# Patient Record
Sex: Female | Born: 1985 | Race: Black or African American | Hispanic: No | Marital: Single | Smoking: Never smoker
Health system: Southern US, Community
[De-identification: ages and names within clinical notes are randomized; demographics above are authoritative.]

## PROBLEM LIST (undated history)

## (undated) DIAGNOSIS — J45909 Unspecified asthma, uncomplicated: Secondary | ICD-10-CM

## (undated) DIAGNOSIS — E785 Hyperlipidemia, unspecified: Secondary | ICD-10-CM

## (undated) DIAGNOSIS — G4730 Sleep apnea, unspecified: Secondary | ICD-10-CM

## (undated) DIAGNOSIS — I2510 Atherosclerotic heart disease of native coronary artery without angina pectoris: Secondary | ICD-10-CM

## (undated) DIAGNOSIS — I10 Essential (primary) hypertension: Secondary | ICD-10-CM

## (undated) DIAGNOSIS — O24419 Gestational diabetes mellitus in pregnancy, unspecified control: Secondary | ICD-10-CM

## (undated) HISTORY — DX: Unspecified asthma, uncomplicated: J45.909

---
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
## 2009-10-20 HISTORY — PX: WISDOM TOOTH EXTRACTION: SHX21

## 2010-06-06 DIAGNOSIS — O139 Gestational [pregnancy-induced] hypertension without significant proteinuria, unspecified trimester: Secondary | ICD-10-CM

## 2017-11-02 ENCOUNTER — Ambulatory Visit: Payer: Self-pay | Admitting: Family Medicine

## 2018-10-20 DIAGNOSIS — O24419 Gestational diabetes mellitus in pregnancy, unspecified control: Secondary | ICD-10-CM

## 2018-10-20 HISTORY — DX: Gestational diabetes mellitus in pregnancy, unspecified control: O24.419

## 2018-10-28 DIAGNOSIS — Z3A00 Weeks of gestation of pregnancy not specified: Secondary | ICD-10-CM | POA: Diagnosis not present

## 2018-10-28 DIAGNOSIS — O26891 Other specified pregnancy related conditions, first trimester: Secondary | ICD-10-CM | POA: Diagnosis present

## 2018-10-28 DIAGNOSIS — O10011 Pre-existing essential hypertension complicating pregnancy, first trimester: Secondary | ICD-10-CM | POA: Insufficient documentation

## 2018-10-28 DIAGNOSIS — R51 Headache: Secondary | ICD-10-CM | POA: Diagnosis not present

## 2018-10-29 ENCOUNTER — Encounter (HOSPITAL_COMMUNITY): Payer: Self-pay | Admitting: *Deleted

## 2018-10-29 ENCOUNTER — Emergency Department (HOSPITAL_COMMUNITY)
Admission: EM | Admit: 2018-10-29 | Discharge: 2018-10-29 | Disposition: A | Discharge status: Home / Self Care | Payer: Medicaid Other | Attending: Emergency Medicine | Admitting: Emergency Medicine

## 2018-10-29 ENCOUNTER — Other Ambulatory Visit: Payer: Self-pay

## 2018-10-29 DIAGNOSIS — I10 Essential (primary) hypertension: Secondary | ICD-10-CM

## 2018-10-29 DIAGNOSIS — Z3490 Encounter for supervision of normal pregnancy, unspecified, unspecified trimester: Secondary | ICD-10-CM

## 2018-10-29 HISTORY — DX: Essential (primary) hypertension: I10

## 2018-10-29 LAB — CBC WITH DIFFERENTIAL/PLATELET
Abs Immature Granulocytes: 0.02 10*3/uL (ref 0.00–0.07)
Basophils Absolute: 0 10*3/uL (ref 0.0–0.1)
Basophils Relative: 0 %
Eosinophils Absolute: 0.3 10*3/uL (ref 0.0–0.5)
Eosinophils Relative: 3 %
HCT: 41.9 % (ref 36.0–46.0)
Hemoglobin: 13.3 g/dL (ref 12.0–15.0)
Immature Granulocytes: 0 %
Lymphocytes Relative: 22 %
Lymphs Abs: 2 10*3/uL (ref 0.7–4.0)
MCH: 26.2 pg (ref 26.0–34.0)
MCHC: 31.7 g/dL (ref 30.0–36.0)
MCV: 82.5 fL (ref 80.0–100.0)
MONO ABS: 0.6 10*3/uL (ref 0.1–1.0)
Monocytes Relative: 7 %
Neutro Abs: 5.9 10*3/uL (ref 1.7–7.7)
Neutrophils Relative %: 68 %
Platelets: 310 10*3/uL (ref 150–400)
RBC: 5.08 MIL/uL (ref 3.87–5.11)
RDW: 13.7 % (ref 11.5–15.5)
WBC: 8.7 10*3/uL (ref 4.0–10.5)
nRBC: 0 % (ref 0.0–0.2)

## 2018-10-29 LAB — URINALYSIS, ROUTINE W REFLEX MICROSCOPIC
Bilirubin Urine: NEGATIVE
GLUCOSE, UA: NEGATIVE mg/dL
Ketones, ur: NEGATIVE mg/dL
Leukocytes, UA: NEGATIVE
Nitrite: NEGATIVE
PH: 6 (ref 5.0–8.0)
Protein, ur: 30 mg/dL — AB
Specific Gravity, Urine: 1.014 (ref 1.005–1.030)

## 2018-10-29 LAB — BASIC METABOLIC PANEL
Anion gap: 12 (ref 5–15)
BUN: 14 mg/dL (ref 6–20)
CO2: 21 mmol/L — ABNORMAL LOW (ref 22–32)
Calcium: 8.9 mg/dL (ref 8.9–10.3)
Chloride: 103 mmol/L (ref 98–111)
Creatinine, Ser: 1.04 mg/dL — ABNORMAL HIGH (ref 0.44–1.00)
GFR calc Af Amer: >60 mL/min (ref >60)
GFR calc non Af Amer: >60 mL/min (ref >60)
Glucose, Bld: 99 mg/dL (ref 70–99)
Potassium: 3.5 mmol/L (ref 3.5–5.1)
Sodium: 136 mmol/L (ref 135–145)

## 2018-10-29 LAB — HCG, QUANTITATIVE, PREGNANCY: hCG, Beta Chain, Quant, S: 101079 m[IU]/mL — ABNORMAL HIGH (ref <5)

## 2018-10-29 MED ORDER — LABETALOL HCL 5 MG/ML IV SOLN
10.0000 mg | Freq: Once | INTRAVENOUS | Status: AC
  Administered 2018-10-29: 10 mg via INTRAVENOUS
  Filled 2018-10-29: qty 4

## 2018-10-29 MED ORDER — PRENATAL VITAMIN 27-0.8 MG PO TABS: 1.0000 | ORAL_TABLET | Freq: Every day | ORAL | 0 refills | Status: DC

## 2018-10-29 MED ORDER — LABETALOL HCL 100 MG PO TABS: 100.0000 mg | ORAL_TABLET | Freq: Two times a day (BID) | ORAL | 0 refills | Status: DC

## 2018-10-29 NOTE — ED Notes (Signed)
Bed: YJ85 Expected date:  Expected time:  Means of arrival:  Comments: [place] d

## 2018-10-29 NOTE — ED Notes (Signed)
Pt felt dizzy upon standing but ambulated with no difficulties to BR.

## 2018-10-29 NOTE — Discharge Instructions (Signed)
Follow up with your primary care provider (use Resource List provided if you do not have a primary care doctor) in the next month to continue treatment of your blood pressure. Take medications as prescribed. Return here as needed. Follow up with OB/GYN for routine prenatal care.

## 2018-10-29 NOTE — ED Triage Notes (Signed)
Pt stated "[facility]  They sent me to [facility] because of my b/p.  They also did a pregnancy test and it was positive.  [facility] told me to come to the ED."

## 2018-10-29 NOTE — ED Provider Notes (Signed)
[place] DEPT Provider Note   CSN: [id number] Arrival date & time: 10/28/18  2338     History   Chief Complaint Chief Complaint  Patient presents with  . Hypertension    HPI Danielle Kent is a 33 y.o. female.  Patient with a history of HTN, off medications for 6+ years, presents with elevated blood pressure found on routine health examination this morning. She went to [facility] for further management and was found to be pregnant. LMP 08/31/18. No chest pain, nausea, abdominal pain, vaginal bleeding or discharge.   The history is provided by the patient. No language interpreter was used.  Hypertension  Associated symptoms include headaches (Pressure type headache, generalized.). Pertinent negatives include no chest pain, no abdominal pain and no shortness of breath.    Past Medical History:  Diagnosis Date  . Hypertension     There are no active problems to display for this patient.   Past Surgical History:  Procedure Laterality Date  . CESAREAN SECTION       OB History    Gravida  1   Para      Term      Preterm      AB      Living        SAB      TAB      Ectopic      Multiple      Live Births               Home Medications    Prior to Admission medications   Not on File    Family History No family history on file.  Social History Social History   Tobacco Use  . Smoking status: Not on file  Substance Use Topics  . Alcohol use: Not Currently  . Drug use: Not Currently     Allergies   Patient has no known allergies.   Review of Systems Review of Systems  Constitutional: Negative for chills and fever.  HENT: Negative.   Eyes: Negative for visual disturbance.  Respiratory: Negative.  Negative for shortness of breath.   Cardiovascular: Negative.  Negative for chest pain.  Gastrointestinal: Negative.  Negative for abdominal pain and nausea.  Musculoskeletal: Negative.   Skin: Negative.     Neurological: Positive for headaches (Pressure type headache, generalized.).     Physical Exam Updated Vital Signs BP (!) 173/97 (BP Location: Left Arm)   Pulse 81   Temp 99.2 F (37.3 C) (Oral)   Resp (!) 21   Ht 5' (1.524 m)   Wt (!) 148.3 kg   LMP 08/31/2018 (Exact Date)   SpO2 98%   BMI 63.86 kg/m   Physical Exam Vitals signs and nursing note reviewed.  Constitutional:      General: She is not in acute distress.    Appearance: She is well-developed.  HENT:     Head: Normocephalic.     Mouth/Throat:     Mouth: Mucous membranes are moist.  Neck:     Musculoskeletal: Normal range of motion and neck supple.  Cardiovascular:     Rate and Rhythm: Normal rate and regular rhythm.     Heart sounds: No murmur.  Pulmonary:     Effort: Pulmonary effort is normal.     Breath sounds: Normal breath sounds. No wheezing, rhonchi or rales.  Abdominal:     General: Bowel sounds are normal.     Palpations: Abdomen is soft.     Tenderness:  There is no abdominal tenderness. There is no guarding or rebound.  Musculoskeletal: Normal range of motion.     Right lower leg: No edema.     Left lower leg: No edema.  Skin:    General: Skin is warm and dry.     Findings: No rash.  Neurological:     Mental Status: She is alert and oriented to person, place, and time.     Deep Tendon Reflexes: Reflexes normal.      ED Treatments / Results  Labs (all labs ordered are listed, but only abnormal results are displayed) Labs Reviewed  BASIC METABOLIC PANEL - Abnormal; Notable for the following components:      Result Value   CO2 21 (*)    Creatinine, Ser 1.04 (*)    All other components within normal limits  URINALYSIS, ROUTINE W REFLEX MICROSCOPIC - Abnormal; Notable for the following components:   Hgb urine dipstick SMALL (*)    Protein, ur 30 (*)    Bacteria, UA RARE (*)    All other components within normal limits  HCG, QUANTITATIVE, PREGNANCY - Abnormal; Notable for the following  components:   hCG, Beta Chain, Quant, S 101,079 (*)    All other components within normal limits  CBC WITH DIFFERENTIAL/PLATELET    EKG None  Radiology No results found.  Procedures Procedures (including critical care time)  Medications Ordered in ED Medications  labetalol (NORMODYNE,TRANDATE) injection 10 mg (10 mg Intravenous Given by Other 10/29/18 3244)     Initial Impression / Assessment and Plan / ED Course  I have reviewed the triage vital signs and the nursing notes.  Pertinent labs & imaging results that were available during my care of the patient were reviewed by me and considered in my medical decision making (see chart for details).     Patient to ED with elevated blood pressure, found to be pregnant on evaluation prior to arrival. Off blood pressure medications x years secondary to financial constraints.   On arrival blood pressure significantly elevated at 210/143. Quant beta is >100000, indicating early pregnancy, c/w dates. No concern for preeclampsia.   Discussed appropriate medications with pharmacist who recommended labetalol. Single dose of 10 mg IV effective in reducing bp to 173/97. Patient reports her headache is improved.   She is felt appropriate for discharge. Will start on 100 mg bid labetalol and encourage PCP follow up. Resources provided.   Final Clinical Impressions(s) / ED Diagnoses   Final diagnoses:  None   1. HTN 2. Pregnant  ED Discharge Orders    None       Charlann Lange, PA-C 10/29/18 0428    Shanon Rosser, MD 10/29/18 [phone]

## 2018-11-25 ENCOUNTER — Encounter (HOSPITAL_COMMUNITY): Payer: Self-pay

## 2018-11-25 ENCOUNTER — Inpatient Hospital Stay (HOSPITAL_COMMUNITY)
Admission: EM | Admit: 2018-11-25 | Discharge: 2018-11-29 | DRG: 832 | Disposition: A | Discharge status: Home / Self Care | Payer: Medicaid Other | Attending: Internal Medicine | Admitting: Internal Medicine

## 2018-11-25 ENCOUNTER — Other Ambulatory Visit: Payer: Self-pay

## 2018-11-25 ENCOUNTER — Emergency Department (HOSPITAL_COMMUNITY): Payer: Medicaid Other

## 2018-11-25 ENCOUNTER — Encounter: Payer: Self-pay | Admitting: Obstetrics and Gynecology

## 2018-11-25 ENCOUNTER — Ambulatory Visit (INDEPENDENT_AMBULATORY_CARE_PROVIDER_SITE_OTHER): Payer: Medicaid Other | Admitting: Obstetrics and Gynecology

## 2018-11-25 DIAGNOSIS — Z3A12 12 weeks gestation of pregnancy: Secondary | ICD-10-CM

## 2018-11-25 DIAGNOSIS — Z6841 Body Mass Index (BMI) 40.0 and over, adult: Secondary | ICD-10-CM

## 2018-11-25 DIAGNOSIS — O99341 Other mental disorders complicating pregnancy, first trimester: Secondary | ICD-10-CM | POA: Diagnosis present

## 2018-11-25 DIAGNOSIS — J45909 Unspecified asthma, uncomplicated: Secondary | ICD-10-CM | POA: Diagnosis present

## 2018-11-25 DIAGNOSIS — Z3481 Encounter for supervision of other normal pregnancy, first trimester: Secondary | ICD-10-CM

## 2018-11-25 DIAGNOSIS — O0990 Supervision of high risk pregnancy, unspecified, unspecified trimester: Secondary | ICD-10-CM

## 2018-11-25 DIAGNOSIS — E876 Hypokalemia: Secondary | ICD-10-CM | POA: Diagnosis present

## 2018-11-25 DIAGNOSIS — I160 Hypertensive urgency: Secondary | ICD-10-CM | POA: Diagnosis present

## 2018-11-25 DIAGNOSIS — O99281 Endocrine, nutritional and metabolic diseases complicating pregnancy, first trimester: Secondary | ICD-10-CM | POA: Diagnosis present

## 2018-11-25 DIAGNOSIS — Z3A13 13 weeks gestation of pregnancy: Secondary | ICD-10-CM

## 2018-11-25 DIAGNOSIS — O99511 Diseases of the respiratory system complicating pregnancy, first trimester: Secondary | ICD-10-CM | POA: Diagnosis present

## 2018-11-25 DIAGNOSIS — F419 Anxiety disorder, unspecified: Secondary | ICD-10-CM | POA: Diagnosis present

## 2018-11-25 DIAGNOSIS — Z79899 Other long term (current) drug therapy: Secondary | ICD-10-CM

## 2018-11-25 DIAGNOSIS — R079 Chest pain, unspecified: Secondary | ICD-10-CM

## 2018-11-25 DIAGNOSIS — O1212 Gestational proteinuria, second trimester: Secondary | ICD-10-CM

## 2018-11-25 DIAGNOSIS — O10911 Unspecified pre-existing hypertension complicating pregnancy, first trimester: Principal | ICD-10-CM | POA: Diagnosis present

## 2018-11-25 DIAGNOSIS — E669 Obesity, unspecified: Secondary | ICD-10-CM

## 2018-11-25 DIAGNOSIS — Z833 Family history of diabetes mellitus: Secondary | ICD-10-CM

## 2018-11-25 DIAGNOSIS — O99211 Obesity complicating pregnancy, first trimester: Secondary | ICD-10-CM | POA: Diagnosis present

## 2018-11-25 LAB — CBC
HCT: 38.4 % (ref 36.0–46.0)
HCT: 37.7 % (ref 36.0–46.0)
HEMOGLOBIN: 12.4 g/dL (ref 12.0–15.0)
Hemoglobin: 12.2 g/dL (ref 12.0–15.0)
MCH: 27 pg (ref 26.0–34.0)
MCH: 27.3 pg (ref 26.0–34.0)
MCHC: 32.3 g/dL (ref 30.0–36.0)
MCHC: 32.4 g/dL (ref 30.0–36.0)
MCV: 83.7 fL (ref 80.0–100.0)
MCV: 84.3 fL (ref 80.0–100.0)
Platelets: 305 10*3/uL (ref 150–400)
Platelets: 276 10*3/uL (ref 150–400)
RBC: 4.59 MIL/uL (ref 3.87–5.11)
RBC: 4.47 MIL/uL (ref 3.87–5.11)
RDW: 13.7 % (ref 11.5–15.5)
RDW: 13.9 % (ref 11.5–15.5)
WBC: 10.1 10*3/uL (ref 4.0–10.5)
WBC: 9.5 10*3/uL (ref 4.0–10.5)
nRBC: 0 % (ref 0.0–0.2)
nRBC: 0 % (ref 0.0–0.2)

## 2018-11-25 LAB — BASIC METABOLIC PANEL
Anion gap: 10 (ref 5–15)
BUN: 13 mg/dL (ref 6–20)
CO2: 23 mmol/L (ref 22–32)
Calcium: 9.2 mg/dL (ref 8.9–10.3)
Chloride: 103 mmol/L (ref 98–111)
Creatinine, Ser: 1.01 mg/dL — ABNORMAL HIGH (ref 0.44–1.00)
GFR calc Af Amer: >60 mL/min (ref >60)
GFR calc non Af Amer: >60 mL/min (ref >60)
Glucose, Bld: 113 mg/dL — ABNORMAL HIGH (ref 70–99)
Potassium: 3.1 mmol/L — ABNORMAL LOW (ref 3.5–5.1)
SODIUM: 136 mmol/L (ref 135–145)

## 2018-11-25 LAB — URINALYSIS, ROUTINE W REFLEX MICROSCOPIC
Bacteria, UA: NONE SEEN
Bilirubin Urine: NEGATIVE
GLUCOSE, UA: NEGATIVE mg/dL
Hgb urine dipstick: NEGATIVE
Ketones, ur: NEGATIVE mg/dL
Leukocytes, UA: NEGATIVE
NITRITE: NEGATIVE
Protein, ur: 100 mg/dL — AB
Specific Gravity, Urine: 1.017 (ref 1.005–1.030)
pH: 6 (ref 5.0–8.0)

## 2018-11-25 LAB — CREATININE, SERUM
Creatinine, Ser: 1.01 mg/dL — ABNORMAL HIGH (ref 0.44–1.00)
GFR calc Af Amer: >60 mL/min (ref >60)
GFR calc non Af Amer: >60 mL/min (ref >60)

## 2018-11-25 LAB — I-STAT TROPONIN, ED: Troponin i, poc: 0.01 ng/mL (ref 0.00–0.08)

## 2018-11-25 LAB — HEPATIC FUNCTION PANEL
ALT: 15 U/L (ref 0–44)
AST: 19 U/L (ref 15–41)
Albumin: 3.3 g/dL — ABNORMAL LOW (ref 3.5–5.0)
Alkaline Phosphatase: 61 U/L (ref 38–126)
BILIRUBIN TOTAL: 0.4 mg/dL (ref 0.3–1.2)
Bilirubin, Direct: <0.1 mg/dL (ref 0.0–0.2)
Total Protein: 7.6 g/dL (ref 6.5–8.1)

## 2018-11-25 LAB — HCG, QUANTITATIVE, PREGNANCY: hCG, Beta Chain, Quant, S: 85573 m[IU]/mL — ABNORMAL HIGH (ref <5)

## 2018-11-25 LAB — I-STAT BETA HCG BLOOD, ED (MC, WL, AP ONLY): I-stat hCG, quantitative: >2000 m[IU]/mL — ABNORMAL HIGH (ref <5)

## 2018-11-25 LAB — TROPONIN I: TROPONIN I: 0.03 ng/mL — AB (ref <0.03)

## 2018-11-25 LAB — MRSA PCR SCREENING: MRSA by PCR: NEGATIVE

## 2018-11-25 LAB — CBG MONITORING, ED: Glucose-Capillary: 84 mg/dL (ref 70–99)

## 2018-11-25 LAB — TSH: TSH: 2.55 u[IU]/mL (ref 0.350–4.500)

## 2018-11-25 LAB — MAGNESIUM: Magnesium: 1.8 mg/dL (ref 1.7–2.4)

## 2018-11-25 MED ORDER — ACETAMINOPHEN 650 MG RE SUPP: 650.0000 mg | Freq: Four times a day (QID) | RECTAL | Status: DC | PRN

## 2018-11-25 MED ORDER — SODIUM CHLORIDE 0.9% FLUSH
3.0000 mL | Freq: Once | INTRAVENOUS | Status: AC
  Administered 2018-11-25: 3 mL via INTRAVENOUS

## 2018-11-25 MED ORDER — ACETAMINOPHEN 325 MG PO TABS
650.0000 mg | ORAL_TABLET | Freq: Four times a day (QID) | ORAL | Status: DC | PRN
  Administered 2018-11-25 – 2018-11-26 (×2): 650 mg via ORAL
  Filled 2018-11-25 (×7): qty 2

## 2018-11-25 MED ORDER — POTASSIUM CHLORIDE CRYS ER 20 MEQ PO TBCR
40.0000 meq | EXTENDED_RELEASE_TABLET | Freq: Once | ORAL | Status: AC
  Administered 2018-11-25: 40 meq via ORAL
  Filled 2018-11-25: qty 2

## 2018-11-25 MED ORDER — ENOXAPARIN SODIUM 40 MG/0.4ML ~~LOC~~ SOLN
40.0000 mg | Freq: Every day | SUBCUTANEOUS | Status: DC
  Administered 2018-11-26: 40 mg via SUBCUTANEOUS
  Filled 2018-11-25: qty 0.4

## 2018-11-25 MED ORDER — HYDRALAZINE HCL 20 MG/ML IJ SOLN
5.0000 mg | Freq: Once | INTRAMUSCULAR | Status: AC
  Administered 2018-11-25: 5 mg via INTRAVENOUS
  Filled 2018-11-25: qty 1

## 2018-11-25 MED ORDER — HYDRALAZINE HCL 20 MG/ML IJ SOLN
10.0000 mg | Freq: Once | INTRAMUSCULAR | Status: AC
  Administered 2018-11-25: 10 mg via INTRAVENOUS
  Filled 2018-11-25: qty 1

## 2018-11-25 MED ORDER — LABETALOL HCL 200 MG PO TABS: 200.0000 mg | ORAL_TABLET | Freq: Three times a day (TID) | ORAL | Status: DC

## 2018-11-25 MED ORDER — ACETAMINOPHEN 325 MG PO TABS
650.0000 mg | ORAL_TABLET | Freq: Four times a day (QID) | ORAL | Status: DC | PRN
  Administered 2018-11-26 – 2018-11-28 (×5): 650 mg via ORAL
  Filled 2018-11-25: qty 2

## 2018-11-25 MED ORDER — PRENATAL PLUS 27-1 MG PO TABS
1.0000 | ORAL_TABLET | Freq: Every day | ORAL | Status: DC
  Administered 2018-11-26 – 2018-11-29 (×4): 1 via ORAL
  Filled 2018-11-25 (×4): qty 1

## 2018-11-25 MED ORDER — LABETALOL HCL 5 MG/ML IV SOLN
10.0000 mg | INTRAVENOUS | Status: DC | PRN
  Administered 2018-11-25 – 2018-11-26 (×5): 10 mg via INTRAVENOUS
  Filled 2018-11-25 (×5): qty 4

## 2018-11-25 MED ORDER — LABETALOL HCL 200 MG PO TABS
200.0000 mg | ORAL_TABLET | Freq: Once | ORAL | Status: AC
  Administered 2018-11-25: 200 mg via ORAL
  Filled 2018-11-25: qty 1

## 2018-11-25 NOTE — Progress Notes (Signed)
Pt is here for initial OB visit. Pt is being sent over to hospital by MD for high blood pressure.

## 2018-11-25 NOTE — ED Notes (Signed)
Per PA, patient given orange juice.

## 2018-11-25 NOTE — ED Notes (Signed)
ED Provider at bedside. 

## 2018-11-25 NOTE — ED Provider Notes (Signed)
[place] DEPT Provider Note   CSN: [id number] Arrival date & time: 11/25/18  1355     History   Chief Complaint Chief Complaint  Patient presents with  . Chest Pain  . Hypertension  . [redacted] weeks pregnant    HPI Danielle Kent is a 33 y.o. female G2, P1 LMP 09/01/2018 with a past medical history of hypertension who presents today for evaluation of chest pain, hypertension and pregnancy.  She reports that she has been having worsening shortness of breath over the past few weeks with exertion.  She has intermittent chest pain that lasts a few hours at a 6 out of 10 before it drops down to 4 out of 10 which is her current baseline.  She reports that she has been taking her labetalol, 100 mg, twice daily with her last dose at 6:30 AM this morning.  She denies missing any doses in the past week.   She reports that her chest pain gets worse with anxiety.  She has no personal history or family history of blood disorders.  No worsening leg swelling.    HPI  Past Medical History:  Diagnosis Date  . Asthma   . Hypertension     Patient Active Problem List   Diagnosis Date Noted  . Supervision of high risk pregnancy, antepartum 11/25/2018  . Hypertensive urgency 11/25/2018    Past Surgical History:  Procedure Laterality Date  . CESAREAN SECTION       OB History    Gravida  2   Para  1   Term  1   Preterm      AB      Living  1     SAB      TAB      Ectopic      Multiple      Live Births  1            Home Medications    Prior to Admission medications   Medication Sig Start Date End Date Taking? Authorizing Provider  labetalol (NORMODYNE) 100 MG tablet Take 1 tablet (100 mg total) by mouth 2 (two) times daily. 10/29/18  Yes Charlann Lange, PA-C  Prenatal Vit-Fe Fumarate-FA (PRENATAL VITAMIN) 27-0.8 MG TABS Take 1 tablet by mouth daily. 10/29/18  Yes Charlann Lange, PA-C    Family History Family History  Problem Relation Age  of Onset  . Diabetes Mother     Social History Social History   Tobacco Use  . Smoking status: Never Smoker  . Smokeless tobacco: Never Used  Substance Use Topics  . Alcohol use: Not Currently  . Drug use: Not Currently     Allergies   Patient has no known allergies.   Review of Systems Review of Systems  Constitutional: Negative for chills and fever.  HENT: Negative for congestion.   Eyes: Negative for visual disturbance.  Respiratory: Positive for chest tightness and shortness of breath.   Cardiovascular: Positive for chest pain. Negative for palpitations and leg swelling.  Gastrointestinal: Negative for abdominal pain, diarrhea, nausea and vomiting.  Genitourinary: Negative for dysuria, hematuria, pelvic pain, vaginal bleeding, vaginal discharge and vaginal pain.  Musculoskeletal: Negative for arthralgias, back pain and neck pain.  Skin: Negative for rash and wound.  Neurological: Positive for headaches.  Psychiatric/Behavioral: Negative for confusion.  All other systems reviewed and are negative.    Physical Exam Updated Vital Signs BP (!) 152/113   Pulse (!) 101   Temp 98.4 F (36.9  C) (Oral)   Resp (!) 26   Ht 5' (1.524 m)   Wt (!) 154.7 kg   LMP 08/31/2018 (Exact Date)   SpO2 96%   BMI 66.60 kg/m   Physical Exam Vitals signs and nursing note reviewed.  Constitutional:      General: She is not in acute distress.    Appearance: She is well-developed. She is obese.  HENT:     Head: Normocephalic and atraumatic.  Eyes:     Conjunctiva/sclera: Conjunctivae normal.     Pupils: Pupils are equal, round, and reactive to light.  Neck:     Musculoskeletal: Normal range of motion and neck supple.  Cardiovascular:     Rate and Rhythm: Normal rate and regular rhythm.     Pulses:          Dorsalis pedis pulses are 2+ on the right side and 2+ on the left side.       Posterior tibial pulses are 2+ on the right side and 2+ on the left side.     Heart sounds:  Normal heart sounds. No murmur.     Comments: Gets tachycardic and Short of breath when moving around in the bed.  Pulmonary:     Effort: Pulmonary effort is normal. No tachypnea or respiratory distress.     Breath sounds: Normal breath sounds. No decreased breath sounds or wheezing.  Abdominal:     Palpations: Abdomen is soft.     Tenderness: There is no abdominal tenderness.  Musculoskeletal:     Right lower leg: She exhibits no tenderness. No edema.     Left lower leg: She exhibits no tenderness. No edema.  Skin:    General: Skin is warm and dry.  Neurological:     General: No focal deficit present.     Mental Status: She is alert and oriented to person, place, and time.  Psychiatric:        Mood and Affect: Mood normal. Mood is not anxious.        Behavior: Behavior normal. Behavior is not agitated.      ED Treatments / Results  Labs (all labs ordered are listed, but only abnormal results are displayed) Labs Reviewed  BASIC METABOLIC PANEL - Abnormal; Notable for the following components:      Result Value   Potassium 3.1 (*)    Glucose, Bld 113 (*)    Creatinine, Ser 1.01 (*)    All other components within normal limits  URINALYSIS, ROUTINE W REFLEX MICROSCOPIC - Abnormal; Notable for the following components:   Protein, ur 100 (*)    All other components within normal limits  HCG, QUANTITATIVE, PREGNANCY - Abnormal; Notable for the following components:   hCG, Beta Chain, Quant, S 85,573 (*)    All other components within normal limits  HEPATIC FUNCTION PANEL - Abnormal; Notable for the following components:   Albumin 3.3 (*)    All other components within normal limits  I-STAT BETA HCG BLOOD, ED ([facility], [facility], [facility] ONLY) - Abnormal; Notable for the following components:   I-stat hCG, quantitative >2,000.0 (*)    All other components within normal limits  MRSA PCR SCREENING  CBC  CBC  HIV ANTIBODY (ROUTINE TESTING W REFLEX)  BASIC METABOLIC PANEL  CBC  CREATININE,  SERUM  TROPONIN I  TROPONIN I  TROPONIN I  TSH  MAGNESIUM  HEPATIC FUNCTION PANEL  I-STAT TROPONIN, ED  CBG MONITORING, ED    EKG EKG Interpretation  Date/Time:  Thursday November 25 2018 15:23:07 EST Ventricular Rate:  103 PR Interval:    QRS Duration: 85 QT Interval:  345 QTC Calculation: 452 R Axis:   -3 Text Interpretation:  Sinus tachycardia Nonspecific ST and T wave abnormality Confirmed by Davonna Belling [phone]) on 11/25/2018 7:34:20 PM   Radiology Dg Chest 2 View  Result Date: 11/25/2018 CLINICAL DATA:  Chest pain x 2 days, elevated BP, non smoker, no cough or congestion, [redacted] weeks pregnant EXAM: CHEST - 2 VIEW COMPARISON:  None. FINDINGS: The heart size and mediastinal contours are within normal limits. Both lungs are clear. No pleural effusion or pneumothorax. The visualized skeletal structures are unremarkable. IMPRESSION: No active cardiopulmonary disease. Electronically Signed   By: Lajean Manes M.D.   On: 11/25/2018 14:39   US Ob Comp < 14 Wks  Result Date: 11/25/2018 CLINICAL DATA:  33 year old female with pelvic pain, chest pain and hypertensive in the 1st trimester of pregnancy. Estimated gestational age by LMP 12 weeks 1 day. Quantitative beta HCG 85,573. EXAM: OBSTETRIC <14 WK ULTRASOUND TECHNIQUE: Transabdominal ultrasound was performed for evaluation of the gestation as well as the maternal uterus and adnexal regions. COMPARISON:  None. FINDINGS: Intrauterine gestational sac: Single Yolk sac:  Not visible Embryo:  Visible Cardiac Activity: Detected Heart Rate: 156 bpm CRL:   75.4 mm   13 w 4 d                  [place] EDC: 05/29/2019 Subchorionic hemorrhage:  None visualized. Maternal uterus/adnexae: Neither ovary is visualized. No pelvic free fluid identified. IMPRESSION: Single living IUP demonstrated. No acute maternal findings visualized. Electronically Signed   By: Genevie Ann M.D.   On: 11/25/2018 19:13    Procedures .Critical Care Performed by: Lorin Glass, PA-C Authorized by: Lorin Glass, PA-C   Critical care provider statement:    Critical care time (minutes):  45   Critical care was necessary to treat or prevent imminent or life-threatening deterioration of the following conditions:  Circulatory failure and cardiac failure   Critical care was time spent personally by me on the following activities:  Discussions with consultants, evaluation of patient's response to treatment, examination of patient, ordering and performing treatments and interventions, ordering and review of laboratory studies, ordering and review of radiographic studies, pulse oximetry, re-evaluation of patient's condition, obtaining history from patient or surrogate and review of old charts Comments:     Systolic blood pressure over 210 with chest pain, attempted control with IVP meds, admission.  Symptoms of endorgan involvement including elevated creatinine and proteinuria.   (including critical care time)   Medications Ordered in ED Medications  sodium chloride flush (NS) 0.9 % injection 3 mL (3 mLs Intravenous Given 11/25/18 1428)  hydrALAZINE (APRESOLINE) injection 5 mg (5 mg Intravenous Given 11/25/18 1616)  potassium chloride SA (K-DUR,KLOR-CON) CR tablet 40 mEq (40 mEq Oral Given 11/25/18 1624)  hydrALAZINE (APRESOLINE) injection 5 mg (5 mg Intravenous Given 11/25/18 1826)  hydrALAZINE (APRESOLINE) injection 10 mg (10 mg Intravenous Given 11/25/18 1934)  labetalol (NORMODYNE) tablet 200 mg (200 mg Oral Given 11/25/18 2015)     Initial Impression / Assessment and Plan / ED Course  I have reviewed the triage vital signs and the nursing notes.  Pertinent labs & imaging results that were available during my care of the patient were reviewed by me and considered in my medical decision making (see chart for details).  Clinical Course as of Nov 25 2318  Thu  Nov 25, 2018  1515 HR 100   [EH]  1515 R 202/114 L 204/120   [EH]  1925 Dr. Kennon Rounds- OB/GYNShe states it is  okay to keep patient over here due to early gestational age.  Not consistent with preeclampsia or eclampsia.  She recommends p.o. trying labetalol 3-400mg  2-3 times a day.  She states that other appropriate medications would be Procardia, Aldomet, HCTZ, and clonidine.   [EH]  1956 Spoke with Dr. Hal Hope who asked me to order labetalol pill 200 mg once now and then 3 times a day.  Order placed.  He will be coming to see patient for admission.   [EH]    Clinical Course User Index [EH] Lorin Glass, PA-C   Patient presents today for evaluation of hypertension in pregnancy.  She was seen outpatient by her OB/GYN where she was found to be markedly hypertensive with systolics over 481 and they recommended she go to the [facility].  Patient however came here.  She reports compliance with her at home p.o. labetalol, stating her last dose was this morning.  She has a history of high blood pressure prior to this pregnancy.  On arrival here her blood pressure was 251/160.  Is also been consistently tachycardic with this.  She reports chest pain at baseline however that has been intermittently worse for the past 2 days.  She has not had any imaging so far this pregnancy.  Her chest x-ray did not show any evidence of acute abnormalities.  Troponin is not elevated.  EKG shows few changes including nonspecific ST and T wave abnormalities without frank evidence of ischemia.  She does report shortness of breath with any exertion.  Her urinalysis has 100 protein however is otherwise normal.  Her potassium is slightly low at 3.1 which was orally repleted.  She is not anemic and does not have a significant leukocytosis.  Suspect that her chest pain, shortness of breath, and proteinuria are related to her hypertension.  Her hypertension was treated with multiple doses of hydralazine IV and labetalol p.o. while in the emergency room.  PE was considered due to tachycardia and tachypnea, however given the setting of  markedly elevated blood pressures, with lack of history of thromboembolism, no leg swelling, I suspect that this is most likely related to hypertension and hypertensive urgency rather than thromboembolism.  I spoke with Dr. Kennon Rounds, on-call for OB/GYN.  Ultrasound was obtained showing a single intrauterine gestation approximately 13 weeks.  She states that it is okay to keep patient over here due to early gestational age, and that it is not consistent with preeclampsia.  I spoke with Dr. Hal Hope who agreed to admit patient.    This patient was seen as a shared visit with Dr. Alvino Chapel. Final Clinical Impressions(s) / ED Diagnoses   Final diagnoses:  Hypertensive urgency  [redacted] weeks gestation of pregnancy  Obesity, unspecified classification, unspecified obesity type, unspecified whether serious comorbidity present  Proteinuria affecting pregnancy in second trimester    ED Discharge Orders    None       Ollen Gross 11/26/18 Sharl Ma    Davonna Belling, MD 11/26/18 [phone]

## 2018-11-25 NOTE — ED Notes (Signed)
Patient reports feeling "lightheaded." PA made aware.

## 2018-11-25 NOTE — ED Triage Notes (Signed)
Patient c/o intermittent mid chest pain  X 2 days. Patient states [facility],but was sent to the ED because she was hypertensive.

## 2018-11-25 NOTE — H&P (Signed)
History and Physical    Danielle Kent VFI:[id number] DOB: 06-05-1986 DOA: 11/25/2018  PCP: Patient, No Pcp Per  Patient coming from: Home.  Chief Complaint: Elevated blood pressure.  HPI: Danielle Kent is a 33 y.o. female with history of hypertension asthma who has been recently started back on labetalol after her confirmation of pregnancy last month presents to the ER with complaints of elevated blood pressure.  Patient states she has been compliant with her labetalol despite which she check her blood pressure at home and has been running very high.  Last couple of days patient has been getting some chest pressure lasting a few minutes on exertion.  Along with that patient also has been exam frontal headache no focal deficits of any visual symptoms.  ED Course: In the ER patient blood pressure initially was around 250/140.  ER physician discussed with patient's OB/GYN Dr. Kennon Rounds who advised increasing her dose of labetalol and also give alternatives of Aldomet clonidine and hydrochlorothiazide if needed.  As per Dr. Kennon Rounds labetalol can go up as high as 400 mg 3 times daily.  ER physician has been giving hydralazine as needed and place her on labetalol 200 mg p.o. 3 times daily prior to which patient was taking 100 twice daily.  I have added labetalol IV as needed 10 mg.  EKG shows normal sinus rhythm.  Troponin was negative.  Ultrasound shows intrauterine pregnancy.  Review of Systems: As per HPI, rest all negative.   Past Medical History:  Diagnosis Date  . Asthma   . Hypertension     Past Surgical History:  Procedure Laterality Date  . CESAREAN SECTION       reports that she has never smoked. She has never used smokeless tobacco. She reports previous alcohol use. She reports previous drug use.  No Known Allergies  Family History  Problem Relation Age of Onset  . Diabetes Mother     Prior to Admission medications   Medication Sig Start Date End Date Taking? Authorizing Provider    labetalol (NORMODYNE) 100 MG tablet Take 1 tablet (100 mg total) by mouth 2 (two) times daily. 10/29/18  Yes Charlann Lange, PA-C  Prenatal Vit-Fe Fumarate-FA (PRENATAL VITAMIN) 27-0.8 MG TABS Take 1 tablet by mouth daily. 10/29/18  Yes Charlann Lange, PA-C    Physical Exam: Vitals:   11/25/18 1830 11/25/18 1905 11/25/18 1930 11/25/18 2002  BP: (!) 220/128 (!) 223/141 (!) 210/108 (!) 208/116  Pulse: (!) 102 (!) 124 (!) 108 (!) 119  Resp: 17 (!) 22 20 (!) 22  Temp:      TempSrc:      SpO2: 97% 99% 99% 99%  Weight:      Height:          Constitutional: Moderately built and nourished. Vitals:   11/25/18 1830 11/25/18 1905 11/25/18 1930 11/25/18 2002  BP: (!) 220/128 (!) 223/141 (!) 210/108 (!) 208/116  Pulse: (!) 102 (!) 124 (!) 108 (!) 119  Resp: 17 (!) 22 20 (!) 22  Temp:      TempSrc:      SpO2: 97% 99% 99% 99%  Weight:      Height:       Eyes: Anicteric no pallor. ENMT: No discharge from the ears eyes nose or mouth. Neck: No mass felt.  No neck rigidity. Respiratory: No rhonchi or crepitations. Cardiovascular: S1-S2 heard. Abdomen: Soft nontender bowel sounds present. Musculoskeletal: No edema.  No joint effusion. Skin: No rash. Neurologic: Alert awake oriented to time  place and person.  Moves all extremities. Psychiatric: Appears normal.  Normal affect.   Labs on Admission: I have personally reviewed following labs and imaging studies  CBC: Recent Labs  Lab 11/25/18 1450  WBC 10.1  HGB 12.4  HCT 38.4  MCV 83.7  PLT 660   Basic Metabolic Panel: Recent Labs  Lab 11/25/18 1450  NA 136  K 3.1*  CL 103  CO2 23  GLUCOSE 113*  BUN 13  CREATININE 1.01*  CALCIUM 9.2   GFR: Estimated Creatinine Clearance: 111.6 mL/min (A) (by C-G formula based on SCr of 1.01 mg/dL (H)). Liver Function Tests: Recent Labs  Lab 11/25/18 1450  AST 19  ALT 15  ALKPHOS 61  BILITOT 0.4  PROT 7.6  ALBUMIN 3.3*   No results for input(s): LIPASE, AMYLASE in the last 168  hours. No results for input(s): AMMONIA in the last 168 hours. Coagulation Profile: No results for input(s): INR, PROTIME in the last 168 hours. Cardiac Enzymes: No results for input(s): CKTOTAL, CKMB, CKMBINDEX, TROPONINI in the last 168 hours. BNP (last 3 results) No results for input(s): PROBNP in the last 8760 hours. HbA1C: No results for input(s): HGBA1C in the last 72 hours. CBG: Recent Labs  Lab 11/25/18 2002  GLUCAP 84   Lipid Profile: No results for input(s): CHOL, HDL, LDLCALC, TRIG, CHOLHDL, LDLDIRECT in the last 72 hours. Thyroid Function Tests: No results for input(s): TSH, T4TOTAL, FREET4, T3FREE, THYROIDAB in the last 72 hours. Anemia Panel: No results for input(s): VITAMINB12, FOLATE, FERRITIN, TIBC, IRON, RETICCTPCT in the last 72 hours. Urine analysis:    Component Value Date/Time   COLORURINE YELLOW 11/25/2018 1600   APPEARANCEUR CLEAR 11/25/2018 1600   LABSPEC 1.017 11/25/2018 1600   PHURINE 6.0 11/25/2018 1600   GLUCOSEU NEGATIVE 11/25/2018 1600   HGBUR NEGATIVE 11/25/2018 1600   BILIRUBINUR NEGATIVE 11/25/2018 1600   KETONESUR NEGATIVE 11/25/2018 1600   PROTEINUR 100 (A) 11/25/2018 1600   NITRITE NEGATIVE 11/25/2018 1600   LEUKOCYTESUR NEGATIVE 11/25/2018 1600   Sepsis Labs: @LABRCNTIP (procalcitonin:4,lacticidven:4) )No results found for this or any previous visit (from the past 240 hour(s)).   Radiological Exams on Admission: Dg Chest 2 View  Result Date: 11/25/2018 CLINICAL DATA:  Chest pain x 2 days, elevated BP, non smoker, no cough or congestion, [redacted] weeks pregnant EXAM: CHEST - 2 VIEW COMPARISON:  None. FINDINGS: The heart size and mediastinal contours are within normal limits. Both lungs are clear. No pleural effusion or pneumothorax. The visualized skeletal structures are unremarkable. IMPRESSION: No active cardiopulmonary disease. Electronically Signed   By: Lajean Manes M.D.   On: 11/25/2018 14:39   US Ob Comp < 14 Wks  Result Date:  11/25/2018 CLINICAL DATA:  33 year old female with pelvic pain, chest pain and hypertensive in the 1st trimester of pregnancy. Estimated gestational age by LMP 12 weeks 1 day. Quantitative beta HCG 85,573. EXAM: OBSTETRIC <14 WK ULTRASOUND TECHNIQUE: Transabdominal ultrasound was performed for evaluation of the gestation as well as the maternal uterus and adnexal regions. COMPARISON:  None. FINDINGS: Intrauterine gestational sac: Single Yolk sac:  Not visible Embryo:  Visible Cardiac Activity: Detected Heart Rate: 156 bpm CRL:   75.4 mm   13 w 4 d                  [place] EDC: 05/29/2019 Subchorionic hemorrhage:  None visualized. Maternal uterus/adnexae: Neither ovary is visualized. No pelvic free fluid identified. IMPRESSION: Single living IUP demonstrated. No acute maternal findings visualized. Electronically  Signed   By: Genevie Ann M.D.   On: 11/25/2018 19:13    EKG: Independently reviewed.  Normal sinus rhythm.  Assessment/Plan Principal Problem:   Hypertensive urgency Active Problems:   Supervision of high risk pregnancy, antepartum    1. Hypertensive urgency -has discussed with Dr. Kennon Rounds patient's OB/GYN by ER physician at this time labetalol dose has been increased hand has been placed on labetalol 200 mg p.o. 3 times daily.  Prior to which patient was taking 100 twice daily.  I have also added patient on PRN IV labetalol 10 mg every 2 hourly for systolic more than 785.  As per the patient's OB/GYN labetalol can be increased up to 403 times daily.  Other alternatives include Aldomet clonidine Procardia and hydrochlorothiazide. 2. Chest pain likely from elevated blood pressure presently chest pain-free.  We will cycle cardiac markers check 2D echo get cardiology input. 3. 13 weeks pregnancy being followed by OB/GYN. 4. Morbid obesity will need nutrition counseling. 5. Mild hypokalemia replace and recheck.   DVT prophylaxis: Lovenox. Code Status: Full code. Family Communication: Discussed with  patient. Disposition Plan: Home. Consults called: Cardiology.  ER physician discussed with patient's OB/GYN. Admission status: Observation.   Rise Patience MD [facility] Pager [phone].  If 7PM-7AM, please contact night-coverage [email or web address] Password [facility]  11/25/2018, 10:39 PM

## 2018-11-25 NOTE — Progress Notes (Signed)
Patient here for NOB, initial BP 210s/140s, sent to MAU for eval. Will reschedule NOB.  Feliz Beam, M.D. Attending Center for [employer] [facility])

## 2018-11-25 NOTE — ED Notes (Signed)
Patient given water

## 2018-11-25 NOTE — ED Notes (Signed)
ED TO INPATIENT HANDOFF REPORT  Name/Age/Gender Danielle Kent 33 y.o. female  Code Status   Home/SNF/Other Home  Chief Complaint Hypertension  Level of Care/Admitting Diagnosis ED Disposition    ED Disposition Condition Comment   Admit  Hospital Area: [facility] [100102]  Level of Care: Stepdown [14]  Admit to SDU based on following criteria: Severe physiological/psychological symptoms:  Any diagnosis requiring assessment & intervention at least every 4 hours on an ongoing basis to obtain desired patient outcomes including stability and rehabilitation  Diagnosis: Hypertensive urgency [025427]  Admitting Physician: Rise Patience [phone]  Attending Physician: Rise Patience [phone]  PT Class (Do Not Modify): Observation [104]  PT Acc Code (Do Not Modify): Observation [10022]       Medical History Past Medical History:  Diagnosis Date  . Asthma   . Hypertension     Allergies No Known Allergies  IV Location/Drains/Wounds Patient Lines/Drains/Airways Status   Active Line/Drains/Airways    Name:   Placement date:   Placement time:   Site:   Days:   Peripheral IV 11/25/18 Left Antecubital   11/25/18    1428    Antecubital   less than 1          Labs/Imaging Results for orders placed or performed during the hospital encounter of 11/25/18 (from the past 48 hour(s))  I-stat troponin, ED     Status: None   Collection Time: 11/25/18  2:34 PM  Result Value Ref Range   Troponin i, poc 0.01 0.00 - 0.08 ng/mL   Comment 3            Comment: Due to the release kinetics of cTnI, a negative result within the first hours of the onset of symptoms does not rule out myocardial infarction with certainty. If myocardial infarction is still suspected, repeat the test at appropriate intervals.   I-Stat beta hCG blood, ED     Status: Abnormal   Collection Time: 11/25/18  2:35 PM  Result Value Ref Range   I-stat hCG, quantitative >2,000.0 (H) <5  mIU/mL   Comment 3            Comment:   GEST. AGE      CONC.  (mIU/mL)   <=1 WEEK        5 - 50     2 WEEKS       50 - 500     3 WEEKS       100 - 10,000     4 WEEKS     1,000 - 30,000        FEMALE AND NON-PREGNANT FEMALE:     LESS THAN 5 mIU/mL   Basic metabolic panel     Status: Abnormal   Collection Time: 11/25/18  2:50 PM  Result Value Ref Range   Sodium 136 135 - 145 mmol/L   Potassium 3.1 (L) 3.5 - 5.1 mmol/L   Chloride 103 98 - 111 mmol/L   CO2 23 22 - 32 mmol/L   Glucose, Bld 113 (H) 70 - 99 mg/dL   BUN 13 6 - 20 mg/dL   Creatinine, Ser 1.01 (H) 0.44 - 1.00 mg/dL   Calcium 9.2 8.9 - 10.3 mg/dL   GFR calc non Af Amer >60 >60 mL/min   GFR calc Af Amer >60 >60 mL/min   Anion gap 10 5 - 15    Comment: Performed at [facility], [place] [address], [place], [place] [address]  CBC  Status: None   Collection Time: 11/25/18  2:50 PM  Result Value Ref Range   WBC 10.1 4.0 - 10.5 K/uL   RBC 4.59 3.87 - 5.11 MIL/uL   Hemoglobin 12.4 12.0 - 15.0 g/dL   HCT 38.4 36.0 - 46.0 %   MCV 83.7 80.0 - 100.0 fL   MCH 27.0 26.0 - 34.0 pg   MCHC 32.3 30.0 - 36.0 g/dL   RDW 13.7 11.5 - 15.5 %   Platelets 305 150 - 400 K/uL   nRBC 0.0 0.0 - 0.2 %    Comment: Performed at [facility], [place] [address] [place], [place] [address]  hCG, quantitative, pregnancy     Status: Abnormal   Collection Time: 11/25/18  2:50 PM  Result Value Ref Range   hCG, Beta Chain, Quant, S 85,573 (H) <5 mIU/mL    Comment:          GEST. AGE      CONC.  (mIU/mL)   <=1 WEEK        5 - 50     2 WEEKS       50 - 500     3 WEEKS       100 - 10,000     4 WEEKS     1,000 - 30,000     5 WEEKS     3,500 - 115,000   6-8 WEEKS     12,000 - 270,000    12 WEEKS     15,000 - 220,000        FEMALE AND NON-PREGNANT FEMALE:     LESS THAN 5 mIU/mL Performed at [facility], [place] [address] [place], [place] [address]   Hepatic function panel     Status: Abnormal    Collection Time: 11/25/18  2:50 PM  Result Value Ref Range   Total Protein 7.6 6.5 - 8.1 g/dL   Albumin 3.3 (L) 3.5 - 5.0 g/dL   AST 19 15 - 41 U/L   ALT 15 0 - 44 U/L   Alkaline Phosphatase 61 38 - 126 U/L   Total Bilirubin 0.4 0.3 - 1.2 mg/dL   Bilirubin, Direct <0.1 0.0 - 0.2 mg/dL   Indirect Bilirubin NOT CALCULATED 0.3 - 0.9 mg/dL    Comment: Performed at [facility], [place] [address], [place], [place] [address]  Urinalysis, Routine w reflex microscopic     Status: Abnormal   Collection Time: 11/25/18  4:00 PM  Result Value Ref Range   Color, Urine YELLOW YELLOW   APPearance CLEAR CLEAR   Specific Gravity, Urine 1.017 1.005 - 1.030   pH 6.0 5.0 - 8.0   Glucose, UA NEGATIVE NEGATIVE mg/dL   Hgb urine dipstick NEGATIVE NEGATIVE   Bilirubin Urine NEGATIVE NEGATIVE   Ketones, ur NEGATIVE NEGATIVE mg/dL   Protein, ur 100 (A) NEGATIVE mg/dL   Nitrite NEGATIVE NEGATIVE   Leukocytes, UA NEGATIVE NEGATIVE   RBC / HPF 0-5 0 - 5 RBC/hpf   WBC, UA 0-5 0 - 5 WBC/hpf   Bacteria, UA NONE SEEN NONE SEEN   Squamous Epithelial / LPF 6-10 0 - 5   Mucus PRESENT     Comment: Performed at [facility], [place] [address], [place], [place] [address]  CBG monitoring, ED     Status: None   Collection Time: 11/25/18  8:02 PM  Result Value Ref Range   Glucose-Capillary 84 70 - 99 mg/dL   Dg Chest 2  View  Result Date: 11/25/2018 CLINICAL DATA:  Chest pain x 2 days, elevated BP, non smoker, no cough or congestion, [redacted] weeks pregnant EXAM: CHEST - 2 VIEW COMPARISON:  None. FINDINGS: The heart size and mediastinal contours are within normal limits. Both lungs are clear. No pleural effusion or pneumothorax. The visualized skeletal structures are unremarkable. IMPRESSION: No active cardiopulmonary disease. Electronically Signed   By: Lajean Manes M.D.   On: 11/25/2018 14:39   US Ob Comp < 14 Wks  Result Date: 11/25/2018 CLINICAL DATA:  33 year old female with pelvic pain,  chest pain and hypertensive in the 1st trimester of pregnancy. Estimated gestational age by LMP 12 weeks 1 day. Quantitative beta HCG 85,573. EXAM: OBSTETRIC <14 WK ULTRASOUND TECHNIQUE: Transabdominal ultrasound was performed for evaluation of the gestation as well as the maternal uterus and adnexal regions. COMPARISON:  None. FINDINGS: Intrauterine gestational sac: Single Yolk sac:  Not visible Embryo:  Visible Cardiac Activity: Detected Heart Rate: 156 bpm CRL:   75.4 mm   13 w 4 d                  [place] EDC: 05/29/2019 Subchorionic hemorrhage:  None visualized. Maternal uterus/adnexae: Neither ovary is visualized. No pelvic free fluid identified. IMPRESSION: Single living IUP demonstrated. No acute maternal findings visualized. Electronically Signed   By: Genevie Ann M.D.   On: 11/25/2018 19:13   EKG Interpretation  Date/Time:  Thursday November 25 2018 15:23:07 EST Ventricular Rate:  103 PR Interval:    QRS Duration: 85 QT Interval:  345 QTC Calculation: 452 R Axis:   -3 Text Interpretation:  Sinus tachycardia Nonspecific ST and T wave abnormality Confirmed by Davonna Belling [id number]) on 11/25/2018 7:34:20 PM   Pending Labs Unresulted Labs (From admission, onward)   None      Vitals/Pain Today's Vitals   11/25/18 1905 11/25/18 1906 11/25/18 1930 11/25/18 2002  BP: (!) 223/141  (!) 210/108 (!) 208/116  Pulse: (!) 124  (!) 108 (!) 119  Resp: (!) 22  20 (!) 22  Temp:      TempSrc:      SpO2: 99%  99% 99%  Weight:      Height:      PainSc:  0-No pain      Isolation Precautions No active isolations  Medications Medications  labetalol (NORMODYNE) tablet 200 mg (has no administration in time range)  sodium chloride flush (NS) 0.9 % injection 3 mL (3 mLs Intravenous Given 11/25/18 1428)  hydrALAZINE (APRESOLINE) injection 5 mg (5 mg Intravenous Given 11/25/18 1616)  potassium chloride SA (K-DUR,KLOR-CON) CR tablet 40 mEq (40 mEq Oral Given 11/25/18 1624)  hydrALAZINE (APRESOLINE) injection  5 mg (5 mg Intravenous Given 11/25/18 1826)  hydrALAZINE (APRESOLINE) injection 10 mg (10 mg Intravenous Given 11/25/18 1934)  labetalol (NORMODYNE) tablet 200 mg (200 mg Oral Given 11/25/18 2015)    Mobility walks

## 2018-11-25 NOTE — Progress Notes (Signed)
CRITICAL VALUE ALERT  Critical Value:  Troponin  Date & Time Notied:  11/25/18 2350  Provider Notified: Schorr   Orders Received/Actions taken: Awaiting further orders

## 2018-11-26 ENCOUNTER — Observation Stay (HOSPITAL_COMMUNITY): Payer: Medicaid Other

## 2018-11-26 ENCOUNTER — Encounter (HOSPITAL_COMMUNITY): Payer: Self-pay | Admitting: Physician Assistant

## 2018-11-26 DIAGNOSIS — R079 Chest pain, unspecified: Secondary | ICD-10-CM | POA: Diagnosis not present

## 2018-11-26 DIAGNOSIS — Z833 Family history of diabetes mellitus: Secondary | ICD-10-CM | POA: Diagnosis not present

## 2018-11-26 DIAGNOSIS — Z79899 Other long term (current) drug therapy: Secondary | ICD-10-CM | POA: Diagnosis not present

## 2018-11-26 DIAGNOSIS — Z3A12 12 weeks gestation of pregnancy: Secondary | ICD-10-CM | POA: Diagnosis not present

## 2018-11-26 DIAGNOSIS — I160 Hypertensive urgency: Secondary | ICD-10-CM | POA: Diagnosis present

## 2018-11-26 DIAGNOSIS — O99511 Diseases of the respiratory system complicating pregnancy, first trimester: Secondary | ICD-10-CM | POA: Diagnosis present

## 2018-11-26 DIAGNOSIS — E876 Hypokalemia: Secondary | ICD-10-CM | POA: Diagnosis present

## 2018-11-26 DIAGNOSIS — J45909 Unspecified asthma, uncomplicated: Secondary | ICD-10-CM | POA: Diagnosis present

## 2018-11-26 DIAGNOSIS — Z6841 Body Mass Index (BMI) 40.0 and over, adult: Secondary | ICD-10-CM | POA: Diagnosis not present

## 2018-11-26 DIAGNOSIS — O99281 Endocrine, nutritional and metabolic diseases complicating pregnancy, first trimester: Secondary | ICD-10-CM | POA: Diagnosis present

## 2018-11-26 DIAGNOSIS — F419 Anxiety disorder, unspecified: Secondary | ICD-10-CM | POA: Diagnosis present

## 2018-11-26 DIAGNOSIS — O10911 Unspecified pre-existing hypertension complicating pregnancy, first trimester: Secondary | ICD-10-CM | POA: Diagnosis present

## 2018-11-26 DIAGNOSIS — O99341 Other mental disorders complicating pregnancy, first trimester: Secondary | ICD-10-CM | POA: Diagnosis present

## 2018-11-26 DIAGNOSIS — O99211 Obesity complicating pregnancy, first trimester: Secondary | ICD-10-CM | POA: Diagnosis present

## 2018-11-26 LAB — TROPONIN I
Troponin I: 0.03 ng/mL
Troponin I: 0.03 ng/mL (ref <0.03)

## 2018-11-26 LAB — HEPATIC FUNCTION PANEL
ALT: 15 U/L (ref 0–44)
AST: 17 U/L (ref 15–41)
Albumin: 3.1 g/dL — ABNORMAL LOW (ref 3.5–5.0)
Alkaline Phosphatase: 55 U/L (ref 38–126)
Bilirubin, Direct: 0.1 mg/dL (ref 0.0–0.2)
Indirect Bilirubin: 0.2 mg/dL — ABNORMAL LOW (ref 0.3–0.9)
Total Bilirubin: 0.3 mg/dL (ref 0.3–1.2)
Total Protein: 6.9 g/dL (ref 6.5–8.1)

## 2018-11-26 LAB — CBC
HCT: 38.9 % (ref 36.0–46.0)
Hemoglobin: 12.2 g/dL (ref 12.0–15.0)
MCH: 26.6 pg (ref 26.0–34.0)
MCHC: 31.4 g/dL (ref 30.0–36.0)
MCV: 84.9 fL (ref 80.0–100.0)
Platelets: 292 K/uL (ref 150–400)
RBC: 4.58 MIL/uL (ref 3.87–5.11)
RDW: 14.1 % (ref 11.5–15.5)
WBC: 8 K/uL (ref 4.0–10.5)
nRBC: 0 % (ref 0.0–0.2)

## 2018-11-26 LAB — ECHOCARDIOGRAM COMPLETE
Height: 60 in
Weight: 5407.44 [oz_av]

## 2018-11-26 LAB — BASIC METABOLIC PANEL WITH GFR
Anion gap: 9 (ref 5–15)
BUN: 12 mg/dL (ref 6–20)
CO2: 23 mmol/L (ref 22–32)
Calcium: 8.8 mg/dL — ABNORMAL LOW (ref 8.9–10.3)
Chloride: 104 mmol/L (ref 98–111)
Creatinine, Ser: 0.99 mg/dL (ref 0.44–1.00)
GFR calc Af Amer: >60 mL/min
GFR calc non Af Amer: >60 mL/min
Glucose, Bld: 91 mg/dL (ref 70–99)
Potassium: 3.3 mmol/L — ABNORMAL LOW (ref 3.5–5.1)
Sodium: 136 mmol/L (ref 135–145)

## 2018-11-26 LAB — HIV ANTIBODY (ROUTINE TESTING W REFLEX): HIV Screen 4th Generation wRfx: NONREACTIVE

## 2018-11-26 MED ORDER — HYDROCODONE-ACETAMINOPHEN 5-325 MG PO TABS
2.0000 | ORAL_TABLET | Freq: Once | ORAL | Status: AC
  Administered 2018-11-26: 2 via ORAL
  Filled 2018-11-26: qty 2

## 2018-11-26 MED ORDER — ENOXAPARIN SODIUM 80 MG/0.8ML ~~LOC~~ SOLN
80.0000 mg | Freq: Every day | SUBCUTANEOUS | Status: DC
  Administered 2018-11-27 – 2018-11-29 (×3): 80 mg via SUBCUTANEOUS
  Filled 2018-11-26 (×3): qty 0.8

## 2018-11-26 MED ORDER — LABETALOL HCL 5 MG/ML IV SOLN
20.0000 mg | INTRAVENOUS | Status: DC | PRN
  Administered 2018-11-26 (×2): 20 mg via INTRAVENOUS
  Filled 2018-11-26 (×2): qty 4

## 2018-11-26 MED ORDER — HYDRALAZINE HCL 25 MG PO TABS
25.0000 mg | ORAL_TABLET | Freq: Three times a day (TID) | ORAL | Status: DC
  Administered 2018-11-26 – 2018-11-27 (×3): 25 mg via ORAL
  Filled 2018-11-26 (×3): qty 1

## 2018-11-26 MED ORDER — HYDRALAZINE HCL 20 MG/ML IJ SOLN
20.0000 mg | INTRAMUSCULAR | Status: DC | PRN
  Administered 2018-11-26 – 2018-11-29 (×6): 20 mg via INTRAVENOUS
  Filled 2018-11-26 (×6): qty 1

## 2018-11-26 MED ORDER — LABETALOL HCL 200 MG PO TABS
300.0000 mg | ORAL_TABLET | Freq: Three times a day (TID) | ORAL | Status: DC
  Administered 2018-11-26 – 2018-11-29 (×10): 300 mg via ORAL
  Filled 2018-11-26 (×10): qty 1

## 2018-11-26 MED ORDER — POTASSIUM CHLORIDE CRYS ER 20 MEQ PO TBCR
40.0000 meq | EXTENDED_RELEASE_TABLET | Freq: Once | ORAL | Status: AC
  Administered 2018-11-26: 40 meq via ORAL
  Filled 2018-11-26: qty 2

## 2018-11-26 NOTE — Consult Note (Addendum)
Cardiology Consultation:   Patient ID: Danielle Kent MRN: [id number]; DOB: 04-20-1986  Admit date: 11/25/2018 Date of Consult: 11/26/2018  Primary Care Provider: Patient, No Pcp Per Primary Cardiologist: New  Primary Electrophysiologist:  None    Patient Profile:   Danielle Kent is a 33 y.o. Kent with a hx of HTN and morbid obesity who is being seen today for the evaluation of chest pain at the request of Dr. Hal Hope.  History of Present Illness:   Danielle Kent is a pleasant 33 year old Danielle obese Kent with past medical history of chronic hypertension.  According to patient, Danielle Kent was diagnosed with high blood pressure about 8 years ago prior to her first pregnancy.  Danielle Kent was initially placed on lisinopril, however as soon as Danielle Kent found out Danielle Kent was pregnant, lisinopril was switched to another medication.  After the pregnancy, Danielle Kent came off of her blood pressure medication and has not been taking it since.  It is around the same time as the diagnosis hypertension, Danielle Kent started noticing substernal chest tightness.  Danielle Kent mentions previous chest tightness only occurs about once a week and that there was no significant increase in frequency.  It went away for several years and came back last year.  For the past few weeks, Danielle Kent has been noticing chest tightness occurring every other day.  Danielle Kent usually noticed the symptom when Danielle Kent is doing something.  Family history wise, her maternal grandfather had an MI at age 42.  Her mother also has history of uncontrolled hypertension and MS.   Patient apparently was seen at [facility] on 10/29/2018 who noted Danielle Kent was pregnant.  Last menstrual period was August 31, 2018.  Danielle Kent was sent to [facility] ED and was placed on labetalol 100 mg twice daily.  Danielle Kent went to her first prenatal appointment yesterday at [facility] which noted her blood pressure was significantly high with systolic blood pressure reaching up to 250.  Danielle Kent was subsequently informed to seek urgent  medical attention, however for some reason, Danielle Kent came to [facility] instead.  At the recommendation of OB/GYN physician, her labetalol was increased.  Systolic blood pressure is coming down into the 140 and 150 range.  Echocardiogram has been ordered and are currently pending.  Cardiology has been consulted for chest pain.   Past Medical History:  Diagnosis Date  . Asthma   . Hypertension     Past Surgical History:  Procedure Laterality Date  . CESAREAN SECTION       Home Medications:  Prior to Admission medications   Medication Sig Start Date End Date Taking? Authorizing Provider  labetalol (NORMODYNE) 100 MG tablet Take 1 tablet (100 mg total) by mouth 2 (two) times daily. 10/29/18  Yes Charlann Lange, PA-C  Prenatal Vit-Fe Fumarate-FA (PRENATAL VITAMIN) 27-0.8 MG TABS Take 1 tablet by mouth daily. 10/29/18  Yes Charlann Lange, PA-C    Inpatient Medications: Scheduled Meds: . enoxaparin (LOVENOX) injection  40 mg Subcutaneous Daily  . labetalol  300 mg Oral TID  . potassium chloride  40 mEq Oral Once  . prenatal vitamin w/FE, FA  1 tablet Oral Daily   Continuous Infusions:  PRN Meds: acetaminophen **OR** acetaminophen, acetaminophen, labetalol  Allergies:   No Known Allergies  Social History:   Social History   Socioeconomic History  . Marital status: Single    Spouse name: Not on file  . Number of children: Not on file  . Years of education: Not on file  . Highest education level:  Not on file  Occupational History  . Not on file  Social Needs  . Financial resource strain: Not on file  . Food insecurity:    Worry: Not on file    Inability: Not on file  . Transportation needs:    Medical: Not on file    Non-medical: Not on file  Tobacco Use  . Smoking status: Never Smoker  . Smokeless tobacco: Never Used  Substance and Sexual Activity  . Alcohol use: Not Currently  . Drug use: Not Currently  . Sexual activity: Yes  Lifestyle  . Physical activity:    Days  per week: Not on file    Minutes per session: Not on file  . Stress: Not on file  Relationships  . Social connections:    Talks on phone: Not on file    Gets together: Not on file    Attends religious service: Not on file    Active member of club or organization: Not on file    Attends meetings of clubs or organizations: Not on file    Relationship status: Not on file  . Intimate partner violence:    Fear of current or ex partner: Not on file    Emotionally abused: Not on file    Physically abused: Not on file    Forced sexual activity: Not on file  Other Topics Concern  . Not on file  Social History Narrative  . Not on file    Family History:    Family History  Problem Relation Age of Onset  . Diabetes Mother   . Hypertension Mother   . Multiple sclerosis Mother   . Asthma Father   . Heart attack Maternal Grandfather 57     ROS:  Please see the history of present illness.   All other ROS reviewed and negative.     Physical Exam/Data:   Vitals:   11/26/18 0219 11/26/18 0354 11/26/18 0400 11/26/18 0500  BP: (!) 193/91  (!) 171/97 (!) 174/108  Pulse: (!) 101  97 91  Resp: (!) 21     Temp:  98.6 F (37 C)    TempSrc:  Oral    SpO2: 97%  96% 95%  Weight:    (!) 153.3 kg  Height:       No intake or output data in the 24 hours ending 11/26/18 0814 Last 3 Weights 11/26/2018 11/25/2018 11/25/2018  Weight (lbs) 337 lb 15.4 oz 341 lb 342 lb 11.2 oz  Weight (kg) 153.3 kg 154.677 kg 155.448 kg     Body mass index is 66 kg/m.  General:  Well nourished, well developed, in no acute distress HEENT: normal Lymph: no adenopathy Neck: no JVD Endocrine:  No thryomegaly Vascular: No carotid bruits; FA pulses 2+ bilaterally without bruits  Cardiac:  normal S1, S2; RRR; no murmur  Lungs:  clear to auscultation bilaterally, no wheezing, rhonchi or rales  Abd: soft, nontender, no hepatomegaly  Ext: no edema Musculoskeletal:  No deformities, BUE and BLE strength normal and  equal Skin: warm and dry  Neuro:  CNs 2-12 intact, no focal abnormalities noted Psych:  Normal affect   EKG:  The EKG was personally reviewed and demonstrates: Normal sinus rhythm without significant ST-T wave changes Telemetry:  Telemetry was personally reviewed and demonstrates: Normal sinus rhythm, no significant ventricular ectopy or arrhythmia.  Relevant CV Studies:  Pending echo  Laboratory Data:  Chemistry Recent Labs  Lab 11/25/18 1450 11/25/18 2302 11/26/18 0534  NA 136  --  136  K 3.1*  --  3.3*  CL 103  --  104  CO2 23  --  23  GLUCOSE 113*  --  91  BUN 13  --  12  CREATININE 1.01* 1.01* 0.99  CALCIUM 9.2  --  8.8*  GFRNONAA >60 >60 >60  GFRAA >60 >60 >60  ANIONGAP 10  --  9    Recent Labs  Lab 11/25/18 1450 11/26/18 0534  PROT 7.6 6.9  ALBUMIN 3.3* 3.1*  AST 19 17  ALT 15 15  ALKPHOS 61 55  BILITOT 0.4 0.3   Hematology Recent Labs  Lab 11/25/18 1450 11/25/18 2302 11/26/18 0534  WBC 10.1 9.5 8.0  RBC 4.59 4.47 4.58  HGB 12.4 12.2 12.2  HCT 38.4 37.7 38.9  MCV 83.7 84.3 84.9  MCH 27.0 27.3 26.6  MCHC 32.3 32.4 31.4  RDW 13.7 13.9 14.1  PLT 305 276 292   Cardiac Enzymes Recent Labs  Lab 11/25/18 2302 11/26/18 0534  TROPONINI 0.03* 0.03*    Recent Labs  Lab 11/25/18 1434  TROPIPOC 0.01    BNPNo results for input(s): BNP, PROBNP in the last 168 hours.  DDimer No results for input(s): DDIMER in the last 168 hours.  Radiology/Studies:  Dg Chest 2 View  Result Date: 11/25/2018 CLINICAL DATA:  Chest pain x 2 days, elevated BP, non smoker, no cough or congestion, [redacted] weeks pregnant EXAM: CHEST - 2 VIEW COMPARISON:  None. FINDINGS: The heart size and mediastinal contours are within normal limits. Both lungs are clear. No pleural effusion or pneumothorax. The visualized skeletal structures are unremarkable. IMPRESSION: No active cardiopulmonary disease. Electronically Signed   By: Lajean Manes M.D.   On: 11/25/2018 14:39   US Ob Comp < 14  Wks  Result Date: 11/25/2018 CLINICAL DATA:  33 year old Kent with pelvic pain, chest pain and hypertensive in the 1st trimester of pregnancy. Estimated gestational age by LMP 12 weeks 1 day. Quantitative beta HCG 85,573. EXAM: OBSTETRIC <14 WK ULTRASOUND TECHNIQUE: Transabdominal ultrasound was performed for evaluation of the gestation as well as the maternal uterus and adnexal regions. COMPARISON:  None. FINDINGS: Intrauterine gestational sac: Single Yolk sac:  Not visible Embryo:  Visible Cardiac Activity: Detected Heart Rate: 156 bpm CRL:   75.4 mm   13 w 4 d                  [place] EDC: 05/29/2019 Subchorionic hemorrhage:  None visualized. Maternal uterus/adnexae: Neither ovary is visualized. No pelvic free fluid identified. IMPRESSION: Single living IUP demonstrated. No acute maternal findings visualized. Electronically Signed   By: Genevie Ann M.D.   On: 11/25/2018 19:13    Assessment and Plan:   1. Chest pain: Danielle Kent has chronic chest pain since Danielle Kent was in her 50s even prior to the first pregnancy.  Although recent chest pain has been more frequent and occurs more so when Danielle Kent is moving, this could still be related to uncontrolled high blood pressure.  Serial troponin borderline elevated to 0.03 and remained flat, inconsistent with ACS.  EKG normal.  Echocardiogram has been ordered and pending.  If echocardiogram is normal, would be very hesitant to pursue any additional work-up given her pregnant status.  2. Uncontrolled hypertension: Danielle Kent has chronic hypertension and has been off of medication for at least 6 years.  Danielle Kent was placed on labetalol 100 mg twice daily in January, this was further uptitrated during this admission.  Currently on 300mg  3 times daily of labetalol.  Recommendation already made by OB/GYN physician regarding which medications are safe for her.  Will defer to the internal medicine service to continue to uptitrate to allow adequate control of her blood pressure.  This is likely the main  reason for her chest pain.  3. Morbid obesity: Danielle Kent is more than 330 pounds.  Weight loss is advised.  4. Pregnancy: Last menstrual period August 31, 2018.  Danielle Kent is [redacted] weeks pregnant.  This is confirmed by beta-hCG and fetal ultrasound      For questions or updates, please contact [facility] Please consult [email or web address] for contact info under     Hilbert Corrigan, [place]  11/26/2018 8:14 AM   Patient examined chart reviewed. Danielle Kent [redacted] weeks pregnant atypical chest pain r/o Reviewed TTE at bedside moderate LVH EF 65% no RWMA;s aortic root normal . Will likely need aldomet added to labetalol for BP control Has had eclampsia and HTN urgency with last pregnancy 8 years ago. Will need close f/u with OB/GYN for BP control no need for further cardiac w/u   Will sign off  Jenkins Rouge

## 2018-11-26 NOTE — Progress Notes (Addendum)
PROGRESS NOTE  Danielle Kent VOP:[id number] DOB: May 20, 1986 DOA: 11/25/2018 PCP: Patient, No Pcp Per  HPI/Recap of past 24 hours: Danielle Kent is a 33 y.o. female with history of hypertension asthma who has been recently started back on labetalol after her confirmation of pregnancy last month presents to the ER with complaints of elevated blood pressure.  Patient states she has been compliant with her labetalol despite which she check her blood pressure at home and has been running very high.  Last couple of days patient has been getting some chest pressure lasting a few minutes on exertion.  Along with that patient also has been exam frontal headache no focal deficits of any visual symptoms.  11/26/2018: Patient seen and examined at bedside.  Reports mild headache this morning.  Blood pressure still elevated and not at goal.  Intermittent chest pain and dyspnea with minimal exertion.  Troponin negative x2.  Cardiology has been consulted and following.  Assessment/Plan: Principal Problem:   Hypertensive urgency Active Problems:   Supervision of high risk pregnancy, antepartum  Hypertensive urgency in the setting of first trimester pregnancy Presented with blood pressure 251/160, renal function, troponin, mental status, chest x-ray unremarkable Started on labetalol IV and titrated up her home dose of labetalol p.o. Blood pressure is still not at goal Increase labetalol to 3 mg 3 times daily Continue to closely monitor vital signs on telemetry ER physician has discussed case with Dr. Kennon Rounds patient's OB/GYN Cardiology has been consulted and following  Chest pain likely secondary to accelerated hypertension Troponin 0.032, trending Twelve-lead EKG with no specific ST-T changes Cardiology has been consulted and following 2D echo is pending  First trimester pregnancy-  [redacted] weeks pregnant Being followed by OB/GYN outpatient Patient is G2, P1, with LMP 09/01/2018  Severe morbid obesity BMI  66 Recommend controlling diet in the setting of pregnancy  Hypokalemia, resolving Repleted Repeated potassium 3.3 from 3.1 Add p.o. KCl supplement 40 mEq once Repeat BMP in the morning  Risks: High risk for decompensation due to hypertensive urgency in the setting of first trimester pregnancy.  Persistent intermittent chest pain.  Patient will require at least 2 midnights for further evaluation and treatment of present condition.  We will change her status admission from observation to inpatient on 11/26/2018.  DVT prophylaxis:  Subcu Lovenox daily. Code Status: Full code. Family Communication: Discussed with patient. Disposition Plan: Home. Consults called: Cardiology.  ER physician discussed with patient's OB/GYN.    Objective: Vitals:   11/26/18 0219 11/26/18 0354 11/26/18 0400 11/26/18 0500  BP: (!) 193/91  (!) 171/97 (!) 174/108  Pulse: (!) 101  97 91  Resp: (!) 21     Temp:  98.6 F (37 C)    TempSrc:  Oral    SpO2: 97%  96% 95%  Weight:    (!) 153.3 kg  Height:       No intake or output data in the 24 hours ending 11/26/18 0759 Filed Weights   11/25/18 1402 11/26/18 0500  Weight: (!) 154.7 kg (!) 153.3 kg    Exam:  . General: 33 y.o. year-old female well developed well nourished in no acute distress.  Alert and oriented x3. . Cardiovascular: Regular rate and rhythm with no rubs or gallops.  No thyromegaly or JVD noted.   Marland Kitchen Respiratory: Clear to auscultation with no wheezes or rales. Good inspiratory effort. . Abdomen: Soft nontender nondistended with normal bowel sounds x4 quadrants. . Musculoskeletal: Trace lower extremity edema. 2/4 pulses in all  4 extremities. . Skin: No ulcerative lesions noted or rashes, . Psychiatry: Mood is appropriate for condition and setting   Data Reviewed: CBC: Recent Labs  Lab 11/25/18 1450 11/25/18 2302 11/26/18 0534  WBC 10.1 9.5 8.0  HGB 12.4 12.2 12.2  HCT 38.4 37.7 38.9  MCV 83.7 84.3 84.9  PLT 305 276 846   Basic  Metabolic Panel: Recent Labs  Lab 11/25/18 1450 11/25/18 2302 11/26/18 0534  NA 136  --  136  K 3.1*  --  3.3*  CL 103  --  104  CO2 23  --  23  GLUCOSE 113*  --  91  BUN 13  --  12  CREATININE 1.01* 1.01* 0.99  CALCIUM 9.2  --  8.8*  MG  --  1.8  --    GFR: Estimated Creatinine Clearance: 113 mL/min (by C-G formula based on SCr of 0.99 mg/dL). Liver Function Tests: Recent Labs  Lab 11/25/18 1450 11/26/18 0534  AST 19 17  ALT 15 15  ALKPHOS 61 55  BILITOT 0.4 0.3  PROT 7.6 6.9  ALBUMIN 3.3* 3.1*   No results for input(s): LIPASE, AMYLASE in the last 168 hours. No results for input(s): AMMONIA in the last 168 hours. Coagulation Profile: No results for input(s): INR, PROTIME in the last 168 hours. Cardiac Enzymes: Recent Labs  Lab 11/25/18 2302 11/26/18 0534  TROPONINI 0.03* 0.03*   BNP (last 3 results) No results for input(s): PROBNP in the last 8760 hours. HbA1C: No results for input(s): HGBA1C in the last 72 hours. CBG: Recent Labs  Lab 11/25/18 2002  GLUCAP 84   Lipid Profile: No results for input(s): CHOL, HDL, LDLCALC, TRIG, CHOLHDL, LDLDIRECT in the last 72 hours. Thyroid Function Tests: Recent Labs    11/25/18 1417  TSH 2.550   Anemia Panel: No results for input(s): VITAMINB12, FOLATE, FERRITIN, TIBC, IRON, RETICCTPCT in the last 72 hours. Urine analysis:    Component Value Date/Time   COLORURINE YELLOW 11/25/2018 1600   APPEARANCEUR CLEAR 11/25/2018 1600   LABSPEC 1.017 11/25/2018 1600   PHURINE 6.0 11/25/2018 1600   GLUCOSEU NEGATIVE 11/25/2018 1600   HGBUR NEGATIVE 11/25/2018 1600   BILIRUBINUR NEGATIVE 11/25/2018 1600   KETONESUR NEGATIVE 11/25/2018 1600   PROTEINUR 100 (A) 11/25/2018 1600   NITRITE NEGATIVE 11/25/2018 1600   LEUKOCYTESUR NEGATIVE 11/25/2018 1600   Sepsis Labs: @LABRCNTIP (procalcitonin:4,lacticidven:4)  ) Recent Results (from the past 240 hour(s))  MRSA PCR Screening     Status: None   Collection Time:  11/25/18  8:55 PM  Result Value Ref Range Status   MRSA by PCR NEGATIVE NEGATIVE Final    Comment:        The GeneXpert MRSA Assay (FDA approved for NASAL specimens only), is one component of a comprehensive MRSA colonization surveillance program. It is not intended to diagnose MRSA infection nor to guide or monitor treatment for MRSA infections. Performed at [facility], [place] [address], [place], [place] [address]       Studies: Dg Chest 2 View  Result Date: 11/25/2018 CLINICAL DATA:  Chest pain x 2 days, elevated BP, non smoker, no cough or congestion, [redacted] weeks pregnant EXAM: CHEST - 2 VIEW COMPARISON:  None. FINDINGS: The heart size and mediastinal contours are within normal limits. Both lungs are clear. No pleural effusion or pneumothorax. The visualized skeletal structures are unremarkable. IMPRESSION: No active cardiopulmonary disease. Electronically Signed   By: Lajean Manes M.D.   On: 11/25/2018 14:39  US Ob Comp < 14 Wks  Result Date: 11/25/2018 CLINICAL DATA:  33 year old female with pelvic pain, chest pain and hypertensive in the 1st trimester of pregnancy. Estimated gestational age by LMP 12 weeks 1 day. Quantitative beta HCG 85,573. EXAM: OBSTETRIC <14 WK ULTRASOUND TECHNIQUE: Transabdominal ultrasound was performed for evaluation of the gestation as well as the maternal uterus and adnexal regions. COMPARISON:  None. FINDINGS: Intrauterine gestational sac: Single Yolk sac:  Not visible Embryo:  Visible Cardiac Activity: Detected Heart Rate: 156 bpm CRL:   75.4 mm   13 w 4 d                  [place] EDC: 05/29/2019 Subchorionic hemorrhage:  None visualized. Maternal uterus/adnexae: Neither ovary is visualized. No pelvic free fluid identified. IMPRESSION: Single living IUP demonstrated. No acute maternal findings visualized. Electronically Signed   By: Genevie Ann M.D.   On: 11/25/2018 19:13    Scheduled Meds: . enoxaparin (LOVENOX) injection  40 mg Subcutaneous  Daily  . labetalol  300 mg Oral TID  . potassium chloride  40 mEq Oral Once  . prenatal vitamin w/FE, FA  1 tablet Oral Daily    Continuous Infusions:   LOS: 0 days     Kayleen Memos, MD [facility] Pager [phone]  If 7PM-7AM, please contact night-coverage [email or web address] Password [email or web address] 11/26/2018, 7:59 AM

## 2018-11-26 NOTE — Progress Notes (Signed)
  Echocardiogram 2D Echocardiogram has been performed.  Darlina Sicilian M 11/26/2018, 9:21 AM

## 2018-11-27 LAB — CBC
HEMATOCRIT: 36.9 % (ref 36.0–46.0)
Hemoglobin: 11.8 g/dL — ABNORMAL LOW (ref 12.0–15.0)
MCH: 27.4 pg (ref 26.0–34.0)
MCHC: 32 g/dL (ref 30.0–36.0)
MCV: 85.6 fL (ref 80.0–100.0)
Platelets: 290 10*3/uL (ref 150–400)
RBC: 4.31 MIL/uL (ref 3.87–5.11)
RDW: 14.4 % (ref 11.5–15.5)
WBC: 9.8 10*3/uL (ref 4.0–10.5)
nRBC: 0 % (ref 0.0–0.2)

## 2018-11-27 LAB — COMPREHENSIVE METABOLIC PANEL
ALT: 14 U/L (ref 0–44)
AST: 16 U/L (ref 15–41)
Albumin: 3 g/dL — ABNORMAL LOW (ref 3.5–5.0)
Alkaline Phosphatase: 51 U/L (ref 38–126)
Anion gap: 10 (ref 5–15)
BILIRUBIN TOTAL: 0.7 mg/dL (ref 0.3–1.2)
BUN: 18 mg/dL (ref 6–20)
CO2: 22 mmol/L (ref 22–32)
Calcium: 8.8 mg/dL — ABNORMAL LOW (ref 8.9–10.3)
Chloride: 102 mmol/L (ref 98–111)
Creatinine, Ser: 1.07 mg/dL — ABNORMAL HIGH (ref 0.44–1.00)
GFR calc Af Amer: >60 mL/min (ref >60)
GFR calc non Af Amer: >60 mL/min (ref >60)
GLUCOSE: 96 mg/dL (ref 70–99)
Potassium: 3.7 mmol/L (ref 3.5–5.1)
Sodium: 134 mmol/L — ABNORMAL LOW (ref 135–145)
TOTAL PROTEIN: 6.6 g/dL (ref 6.5–8.1)

## 2018-11-27 MED ORDER — NIFEDIPINE 10 MG PO CAPS
10.0000 mg | ORAL_CAPSULE | Freq: Three times a day (TID) | ORAL | Status: DC
  Administered 2018-11-27 – 2018-11-28 (×3): 10 mg via ORAL
  Filled 2018-11-27 (×3): qty 1

## 2018-11-27 MED ORDER — HYDRALAZINE HCL 50 MG PO TABS
50.0000 mg | ORAL_TABLET | Freq: Three times a day (TID) | ORAL | Status: DC
  Administered 2018-11-27: 50 mg via ORAL
  Filled 2018-11-27: qty 1

## 2018-11-27 MED ORDER — HYDRALAZINE HCL 50 MG PO TABS: 50.0000 mg | ORAL_TABLET | Freq: Three times a day (TID) | ORAL | Status: DC

## 2018-11-27 MED ORDER — HYDRALAZINE HCL 50 MG PO TABS
50.0000 mg | ORAL_TABLET | Freq: Once | ORAL | Status: AC
  Administered 2018-11-27: 50 mg via ORAL
  Filled 2018-11-27: qty 1

## 2018-11-27 MED ORDER — HYDRALAZINE HCL 25 MG PO TABS
25.0000 mg | ORAL_TABLET | Freq: Once | ORAL | Status: AC
  Administered 2018-11-27: 25 mg via ORAL
  Filled 2018-11-27: qty 1

## 2018-11-27 MED ORDER — HYDRALAZINE HCL 50 MG PO TABS
100.0000 mg | ORAL_TABLET | Freq: Three times a day (TID) | ORAL | Status: DC
  Administered 2018-11-27 – 2018-11-29 (×6): 100 mg via ORAL
  Filled 2018-11-27 (×6): qty 2

## 2018-11-27 NOTE — Progress Notes (Signed)
PROGRESS NOTE  Danielle Kent WIO:[id number] DOB: 20-Jun-1986 DOA: 11/25/2018 PCP: Patient, No Pcp Per  HPI/Recap of past 24 hours: Danielle Kent is a 33 y.o. female with history of hypertension asthma who has been recently started back on labetalol after her confirmation of pregnancy last month presents to the ER with complaints of elevated blood pressure.  Patient states she has been compliant with her labetalol despite which she check her blood pressure at home and has been running very high.  Last couple of days patient has been getting some chest pressure lasting a few minutes on exertion.  Along with that patient also has been exam frontal headache no focal deficits of any visual symptoms.  11/26/2018: Patient seen and examined at bedside.  Reports mild headache this morning.  Blood pressure still elevated and not at goal.  Intermittent chest pain and dyspnea with minimal exertion.  Troponin negative x2.  Cardiology has been consulted and following.  11/27/18: seen and examined at her bedside. BP uncontrolled. Added po hydralazine which is ok to use in pregnancy per ob-gyn on call on 11/26/18. Increased dose.  Assessment/Plan: Principal Problem:   Hypertensive urgency Active Problems:   Supervision of high risk pregnancy, antepartum   Chest pain  Hypertensive urgency in the setting of first trimester pregnancy Presented with blood pressure 251/160, renal function, troponin, mental status, chest x-ray unremarkable Started on labetalol IV and titrated up her home dose of labetalol p.o. Blood pressure is still not at goal Maxed out p.o. labetalol to 300 mg 3 times daily Maxed out p.o. hydralazine to 100 mg 3 times daily Continue to closely monitor vital signs  Resolved chest pain likely secondary to accelerated hypertension Troponin 0.033 Twelve-lead EKG with no specific ST-T changes Cardiology has been consulted and following 2D echo done on 11/26/2018 is essentially unremarkable except for mildly  dilated left atrium.  First trimester pregnancy-  [redacted] weeks pregnant Being followed by OB/GYN outpatient Patient is G2, P1, with LMP 09/01/2018  Severe morbid obesity BMI 66 Recommend controlling diet in the setting of pregnancy  Resolved hypokalemia post repletion   DVT prophylaxis:  Subcu Lovenox daily. Code Status: Full code. Family Communication: Discussed with patient. Disposition Plan: Home possibly tomorrow 11/29/2019 when blood pressure is close to goal Consults called: Cardiology.  ER physician discussed with patient's OB/GYN.  Discussed with OB/GYN as well on 11/26/2018, okay to use hydralazine and nifedipine.    Objective: Vitals:   11/27/18 1330 11/27/18 1400 11/27/18 1530 11/27/18 1605  BP: (!) 194/110 (!) 196/108  (!) 192/84  Pulse: (!) 101 92  83  Resp: 15 (!) 21  (!) 34  Temp:   98.5 F (36.9 C)   TempSrc:   Oral   SpO2: 99% 100% 98% 97%  Weight:      Height:        Intake/Output Summary (Last 24 hours) at 11/27/2018 1631 Last data filed at 11/27/2018 1043 Gross per 24 hour  Intake 480 ml  Output -  Net 480 ml   Filed Weights   11/25/18 1402 11/26/18 0500  Weight: (!) 154.7 kg (!) 153.3 kg    Exam:  . General: 33 y.o. year-old female well-developed well-nourished in no acute distress.  Alert and oriented x3. . Cardiovascular: Regular rate and rhythm with no rubs or gallops.  No JVD or thyromegaly . Respiratory: Clear to Auscultation with No Wheezes or Rales.  Good inspiratory effort. . Abdomen: Soft nontender nondistended with normal bowel sounds x4 quadrants. Marland Kitchen  Musculoskeletal: No lower extremity edema. 2/4 pulses in all 4 extremities. . Skin: No ulcerative lesions noted or rashes, . Psychiatry: Mood is appropriate for condition and setting   Data Reviewed: CBC: Recent Labs  Lab 11/25/18 1450 11/25/18 2302 11/26/18 0534 11/27/18 0727  WBC 10.1 9.5 8.0 9.8  HGB 12.4 12.2 12.2 11.8*  HCT 38.4 37.7 38.9 36.9  MCV 83.7 84.3 84.9 85.6  PLT 305  276 292 810   Basic Metabolic Panel: Recent Labs  Lab 11/25/18 1450 11/25/18 2302 11/26/18 0534 11/27/18 0727  NA 136  --  136 134*  K 3.1*  --  3.3* 3.7  CL 103  --  104 102  CO2 23  --  23 22  GLUCOSE 113*  --  91 96  BUN 13  --  12 18  CREATININE 1.01* 1.01* 0.99 1.07*  CALCIUM 9.2  --  8.8* 8.8*  MG  --  1.8  --   --    GFR: Estimated Creatinine Clearance: 104.6 mL/min (A) (by C-G formula based on SCr of 1.07 mg/dL (H)). Liver Function Tests: Recent Labs  Lab 11/25/18 1450 11/26/18 0534 11/27/18 0727  AST 19 17 16   ALT 15 15 14   ALKPHOS 61 55 51  BILITOT 0.4 0.3 0.7  PROT 7.6 6.9 6.6  ALBUMIN 3.3* 3.1* 3.0*   No results for input(s): LIPASE, AMYLASE in the last 168 hours. No results for input(s): AMMONIA in the last 168 hours. Coagulation Profile: No results for input(s): INR, PROTIME in the last 168 hours. Cardiac Enzymes: Recent Labs  Lab 11/25/18 2302 11/26/18 0534 11/26/18 1033  TROPONINI 0.03* 0.03* 0.03*   BNP (last 3 results) No results for input(s): PROBNP in the last 8760 hours. HbA1C: No results for input(s): HGBA1C in the last 72 hours. CBG: Recent Labs  Lab 11/25/18 2002  GLUCAP 84   Lipid Profile: No results for input(s): CHOL, HDL, LDLCALC, TRIG, CHOLHDL, LDLDIRECT in the last 72 hours. Thyroid Function Tests: Recent Labs    11/25/18 1417  TSH 2.550   Anemia Panel: No results for input(s): VITAMINB12, FOLATE, FERRITIN, TIBC, IRON, RETICCTPCT in the last 72 hours. Urine analysis:    Component Value Date/Time   COLORURINE YELLOW 11/25/2018 1600   APPEARANCEUR CLEAR 11/25/2018 1600   LABSPEC 1.017 11/25/2018 1600   PHURINE 6.0 11/25/2018 1600   GLUCOSEU NEGATIVE 11/25/2018 1600   HGBUR NEGATIVE 11/25/2018 1600   BILIRUBINUR NEGATIVE 11/25/2018 1600   KETONESUR NEGATIVE 11/25/2018 1600   PROTEINUR 100 (A) 11/25/2018 1600   NITRITE NEGATIVE 11/25/2018 1600   LEUKOCYTESUR NEGATIVE 11/25/2018 1600   Sepsis  Labs: @LABRCNTIP (procalcitonin:4,lacticidven:4)  ) Recent Results (from the past 240 hour(s))  MRSA PCR Screening     Status: None   Collection Time: 11/25/18  8:55 PM  Result Value Ref Range Status   MRSA by PCR NEGATIVE NEGATIVE Final    Comment:        The GeneXpert MRSA Assay (FDA approved for NASAL specimens only), is one component of a comprehensive MRSA colonization surveillance program. It is not intended to diagnose MRSA infection nor to guide or monitor treatment for MRSA infections. Performed at [facility], [place] [address], [place], [place] [address]       Studies: No results found.  Scheduled Meds: . enoxaparin (LOVENOX) injection  80 mg Subcutaneous Daily  . hydrALAZINE  50 mg Oral Q8H  . labetalol  300 mg Oral TID  . prenatal vitamin w/FE, FA  1 tablet  Oral Daily    Continuous Infusions:   LOS: 1 day     Kayleen Memos, MD [facility] Pager [phone]  If 7PM-7AM, please contact night-coverage [email or web address] Password [facility] 11/27/2018, 4:31 PM

## 2018-11-27 NOTE — Progress Notes (Signed)
MD made aware of elevation in bp levels. New orders placed. VWilliams,RN.

## 2018-11-28 LAB — COMPREHENSIVE METABOLIC PANEL
ALT: 14 U/L (ref 0–44)
ANION GAP: 10 (ref 5–15)
AST: 19 U/L (ref 15–41)
Albumin: 3 g/dL — ABNORMAL LOW (ref 3.5–5.0)
Alkaline Phosphatase: 54 U/L (ref 38–126)
BUN: 15 mg/dL (ref 6–20)
CO2: 21 mmol/L — ABNORMAL LOW (ref 22–32)
Calcium: 9.1 mg/dL (ref 8.9–10.3)
Chloride: 103 mmol/L (ref 98–111)
Creatinine, Ser: 0.93 mg/dL (ref 0.44–1.00)
GFR calc Af Amer: >60 mL/min (ref >60)
GFR calc non Af Amer: >60 mL/min (ref >60)
GLUCOSE: 95 mg/dL (ref 70–99)
Potassium: 4.3 mmol/L (ref 3.5–5.1)
Sodium: 134 mmol/L — ABNORMAL LOW (ref 135–145)
TOTAL PROTEIN: 7 g/dL (ref 6.5–8.1)
Total Bilirubin: 0.3 mg/dL (ref 0.3–1.2)

## 2018-11-28 LAB — CBC
HEMATOCRIT: 40.5 % (ref 36.0–46.0)
HEMOGLOBIN: 12.5 g/dL (ref 12.0–15.0)
MCH: 26.4 pg (ref 26.0–34.0)
MCHC: 30.9 g/dL (ref 30.0–36.0)
MCV: 85.6 fL (ref 80.0–100.0)
Platelets: 268 10*3/uL (ref 150–400)
RBC: 4.73 MIL/uL (ref 3.87–5.11)
RDW: 14.6 % (ref 11.5–15.5)
WBC: 8.2 10*3/uL (ref 4.0–10.5)
nRBC: 0 % (ref 0.0–0.2)

## 2018-11-28 MED ORDER — NIFEDIPINE 10 MG PO CAPS
20.0000 mg | ORAL_CAPSULE | Freq: Three times a day (TID) | ORAL | Status: DC
  Filled 2018-11-28: qty 2

## 2018-11-28 MED ORDER — NIFEDIPINE 10 MG PO CAPS
10.0000 mg | ORAL_CAPSULE | Freq: Three times a day (TID) | ORAL | Status: DC
  Filled 2018-11-28: qty 1

## 2018-11-28 MED ORDER — LIP MEDEX EX OINT
TOPICAL_OINTMENT | CUTANEOUS | Status: DC | PRN
  Filled 2018-11-28: qty 7

## 2018-11-28 MED ORDER — NIFEDIPINE 10 MG PO CAPS
20.0000 mg | ORAL_CAPSULE | Freq: Three times a day (TID) | ORAL | Status: DC
  Administered 2018-11-28 – 2018-11-29 (×4): 20 mg via ORAL
  Filled 2018-11-28 (×4): qty 2

## 2018-11-28 MED ORDER — CLONIDINE HCL 0.1 MG PO TABS
0.1000 mg | ORAL_TABLET | Freq: Three times a day (TID) | ORAL | Status: DC
  Administered 2018-11-28 (×2): 0.1 mg via ORAL
  Filled 2018-11-28 (×2): qty 1

## 2018-11-28 NOTE — Progress Notes (Signed)
PROGRESS NOTE  Danielle Kent ZYY:[id number] DOB: [birth year] DOA: 11/25/2018 PCP: Patient, No Pcp Per  HPI/Recap of past 24 hours: Danielle Kent is a 33 y.o. female with history of hypertension, asthma who has been recently started back on labetalol after her confirmation of pregnancy last month presents to the ER with complaints of elevated blood pressure.  Patient states she has been compliant with her labetalol. Last couple of days patient has been getting some chest pressure lasting a few minutes on exertion.  Also frontal headache with no focal deficits or visual symptoms.  Hospital course complicated by persistently labile blood pressure.  Maxed out 3 antihypertensives and blood pressure still uncontrolled.   11/28/18: Patient seen and examined at bedside.  She has no new complaints.  Blood pressure continues to be uncontrolled despite maxing out 3 blood pressure medications.  Assessment/Plan: Principal Problem:   Hypertensive urgency Active Problems:   Supervision of high risk pregnancy, antepartum   Chest pain  Hypertensive urgency in the setting of first trimester pregnancy Presented with blood pressure 251/160, renal function, troponin, mental status, chest x-ray unremarkable Started on labetalol IV and titrated up her home dose of labetalol p.o. Blood pressure is still not at goal Maxed out p.o. labetalol to 300 mg 3 times daily Maxed out p.o. hydralazine to 100 mg 3 times daily Maxed out p.o. nifedipine 20 mg 3 times daily Add clonidine if hypertension persists  Resolved chest pain likely secondary to accelerated hypertension Troponin 0.033 Twelve-lead EKG with no specific ST-T changes Cardiology has been consulted and following 2D echo done on 11/26/2018 is essentially unremarkable except for mildly dilated left atrium.  First trimester pregnancy-  [redacted] weeks pregnant Being followed by OB/GYN outpatient Patient is G2, P1, with LMP 09/01/2018  Severe morbid obesity BMI  66 Recommend controlling diet in the setting of pregnancy  Resolved hypokalemia post repletion   DVT prophylaxis:  Subcu Lovenox daily. Code Status: Full code. Family Communication: Discussed with patient. Disposition Plan: Home possibly tomorrow 11/29/2019 when blood pressure is close to goal Consults called: Cardiology.  ER physician discussed with patient's OB/GYN.  Discussed with OB/GYN as well on 11/26/2018, okay to use hydralazine and nifedipine.    Objective: Vitals:   11/28/18 1030 11/28/18 1100 11/28/18 1130 11/28/18 1200  BP: (!) 213/125 (!) 212/111 (!) 186/98 (!) 226/104  Pulse: (!) 109 89 94 (!) 111  Resp: (!) 25 (!) 0 18 18  Temp:    98.3 F (36.8 C)  TempSrc:    Oral  SpO2: 96% 92% 98% 97%  Weight:      Height:       No intake or output data in the 24 hours ending 11/28/18 1226 Filed Weights   11/25/18 1402 11/26/18 0500 11/28/18 0601  Weight: (!) 154.7 kg (!) 153.3 kg (!) 156 kg    Exam:  . General: 33 y.o. year-old female morbidly obese in no acute distress.  Alert and oriented x3.  Cardiovascular: Regular rate and rhythm with no rubs or gallops.  No JVD or thyromegaly . Respiratory: Clear to auscultation with no wheezes or rales.  Good effort. . Abdomen: Soft nontender nondistended with normal bowel sounds x4 quadrants. . Musculoskeletal: No pitting edema noted. 2/4 pulses in all 4 extremities. Marland Kitchen Psychiatry: Mood is appropriate for condition and setting   Data Reviewed: CBC: Recent Labs  Lab 11/25/18 1450 11/25/18 2302 11/26/18 0534 11/27/18 0727 11/28/18 0417  WBC 10.1 9.5 8.0 9.8 8.2  HGB 12.4 12.2 12.2 11.8*  12.5  HCT 38.4 37.7 38.9 36.9 40.5  MCV 83.7 84.3 84.9 85.6 85.6  PLT 305 276 292 290 027   Basic Metabolic Panel: Recent Labs  Lab 11/25/18 1450 11/25/18 2302 11/26/18 0534 11/27/18 0727 11/28/18 0417  NA 136  --  136 134* 134*  K 3.1*  --  3.3* 3.7 4.3  CL 103  --  104 102 103  CO2 23  --  23 22 21*  GLUCOSE 113*  --  91 96 95   BUN 13  --  12 18 15   CREATININE 1.01* 1.01* 0.99 1.07* 0.93  CALCIUM 9.2  --  8.8* 8.8* 9.1  MG  --  1.8  --   --   --    GFR: Estimated Creatinine Clearance: 121.8 mL/min (by C-G formula based on SCr of 0.93 mg/dL). Liver Function Tests: Recent Labs  Lab 11/25/18 1450 11/26/18 0534 11/27/18 0727 11/28/18 0417  AST 19 17 16 19   ALT 15 15 14 14   ALKPHOS 61 55 51 54  BILITOT 0.4 0.3 0.7 0.3  PROT 7.6 6.9 6.6 7.0  ALBUMIN 3.3* 3.1* 3.0* 3.0*   No results for input(s): LIPASE, AMYLASE in the last 168 hours. No results for input(s): AMMONIA in the last 168 hours. Coagulation Profile: No results for input(s): INR, PROTIME in the last 168 hours. Cardiac Enzymes: Recent Labs  Lab 11/25/18 2302 11/26/18 0534 11/26/18 1033  TROPONINI 0.03* 0.03* 0.03*   BNP (last 3 results) No results for input(s): PROBNP in the last 8760 hours. HbA1C: No results for input(s): HGBA1C in the last 72 hours. CBG: Recent Labs  Lab 11/25/18 2002  GLUCAP 84   Lipid Profile: No results for input(s): CHOL, HDL, LDLCALC, TRIG, CHOLHDL, LDLDIRECT in the last 72 hours. Thyroid Function Tests: Recent Labs    11/25/18 1417  TSH 2.550   Anemia Panel: No results for input(s): VITAMINB12, FOLATE, FERRITIN, TIBC, IRON, RETICCTPCT in the last 72 hours. Urine analysis:    Component Value Date/Time   COLORURINE YELLOW 11/25/2018 1600   APPEARANCEUR CLEAR 11/25/2018 1600   LABSPEC 1.017 11/25/2018 1600   PHURINE 6.0 11/25/2018 1600   GLUCOSEU NEGATIVE 11/25/2018 1600   HGBUR NEGATIVE 11/25/2018 1600   BILIRUBINUR NEGATIVE 11/25/2018 1600   KETONESUR NEGATIVE 11/25/2018 1600   PROTEINUR 100 (A) 11/25/2018 1600   NITRITE NEGATIVE 11/25/2018 1600   LEUKOCYTESUR NEGATIVE 11/25/2018 1600   Sepsis Labs: @LABRCNTIP (procalcitonin:4,lacticidven:4)  ) Recent Results (from the past 240 hour(s))  MRSA PCR Screening     Status: None   Collection Time: 11/25/18  8:55 PM  Result Value Ref Range Status    MRSA by PCR NEGATIVE NEGATIVE Final    Comment:        The GeneXpert MRSA Assay (FDA approved for NASAL specimens only), is one component of a comprehensive MRSA colonization surveillance program. It is not intended to diagnose MRSA infection nor to guide or monitor treatment for MRSA infections. Performed at [facility], [place] [address] [place], [place] [address]       Studies: No results found.  Scheduled Meds: . enoxaparin (LOVENOX) injection  80 mg Subcutaneous Daily  . hydrALAZINE  100 mg Oral Q8H  . labetalol  300 mg Oral TID  . NIFEdipine  20 mg Oral Q8H  . prenatal vitamin w/FE, FA  1 tablet Oral Daily    Continuous Infusions:   LOS: 2 days     Kayleen Memos, MD [facility] Pager [phone]  If  7PM-7AM, please contact night-coverage [email or web address] Password [facility] 11/28/2018, 12:26 PM

## 2018-11-29 LAB — COMPREHENSIVE METABOLIC PANEL
ALT: 13 U/L (ref 0–44)
AST: 21 U/L (ref 15–41)
Albumin: 3.1 g/dL — ABNORMAL LOW (ref 3.5–5.0)
Alkaline Phosphatase: 48 U/L (ref 38–126)
Anion gap: 10 (ref 5–15)
BUN: 16 mg/dL (ref 6–20)
CO2: 22 mmol/L (ref 22–32)
Calcium: 9 mg/dL (ref 8.9–10.3)
Chloride: 102 mmol/L (ref 98–111)
Creatinine, Ser: 1.15 mg/dL — ABNORMAL HIGH (ref 0.44–1.00)
GFR calc Af Amer: >60 mL/min (ref >60)
GFR calc non Af Amer: >60 mL/min (ref >60)
Glucose, Bld: 86 mg/dL (ref 70–99)
POTASSIUM: 4.4 mmol/L (ref 3.5–5.1)
Sodium: 134 mmol/L — ABNORMAL LOW (ref 135–145)
Total Bilirubin: 0.7 mg/dL (ref 0.3–1.2)
Total Protein: 6.9 g/dL (ref 6.5–8.1)

## 2018-11-29 LAB — CBC
HEMATOCRIT: 38.5 % (ref 36.0–46.0)
Hemoglobin: 11.9 g/dL — ABNORMAL LOW (ref 12.0–15.0)
MCH: 27.1 pg (ref 26.0–34.0)
MCHC: 30.9 g/dL (ref 30.0–36.0)
MCV: 87.7 fL (ref 80.0–100.0)
Platelets: 301 10*3/uL (ref 150–400)
RBC: 4.39 MIL/uL (ref 3.87–5.11)
RDW: 14.9 % (ref 11.5–15.5)
WBC: 9.1 10*3/uL (ref 4.0–10.5)
nRBC: 0 % (ref 0.0–0.2)

## 2018-11-29 MED ORDER — HYDRALAZINE HCL 100 MG PO TABS: 100.0000 mg | ORAL_TABLET | Freq: Three times a day (TID) | ORAL | 0 refills | Status: DC

## 2018-11-29 MED ORDER — LABETALOL HCL 300 MG PO TABS: 300.0000 mg | ORAL_TABLET | Freq: Three times a day (TID) | ORAL | 0 refills | Status: DC

## 2018-11-29 MED ORDER — NIFEDIPINE 20 MG PO CAPS: 20.0000 mg | ORAL_CAPSULE | Freq: Three times a day (TID) | ORAL | 0 refills | Status: DC

## 2018-11-29 MED ORDER — CLONIDINE HCL 0.2 MG PO TABS: 0.2000 mg | ORAL_TABLET | Freq: Three times a day (TID) | ORAL | 0 refills | Status: DC

## 2018-11-29 MED ORDER — CLONIDINE HCL 0.1 MG PO TABS
0.2000 mg | ORAL_TABLET | Freq: Three times a day (TID) | ORAL | Status: DC
  Administered 2018-11-29: 0.2 mg via ORAL
  Filled 2018-11-29: qty 2

## 2018-11-29 NOTE — Progress Notes (Signed)
Patient agrees to buy an automatic blood pressure cuff to take blood pressure at home for monitoring. This was relayed to MD [facility].

## 2018-11-29 NOTE — Discharge Instructions (Signed)
Preventing Hypertension Hypertension, commonly called high blood pressure, is when the force of blood pumping through the arteries is too strong. Arteries are blood vessels that carry blood from the heart throughout the body. Over time, hypertension can damage the arteries and decrease blood flow to important parts of the body, including the brain, heart, and kidneys. Often, hypertension does not cause symptoms until blood pressure is very high. For this reason, it is important to have your blood pressure checked on a regular basis. Hypertension can often be prevented with diet and lifestyle changes. If you already have hypertension, you can control it with diet and lifestyle changes, as well as medicine. What nutrition changes can be made? Maintain a healthy diet. This includes:  Eating less salt (sodium). Ask your health care provider how much sodium is safe for you to have. The general recommendation is to consume less than 1 tsp (2,300 mg) of sodium a day. ? Do not add salt to your food. ? Choose low-sodium options when grocery shopping and eating out.  Limiting fats in your diet. You can do this by eating low-fat or fat-free dairy products and by eating less red meat.  Eating more fruits, vegetables, and whole grains. Make a goal to eat: ? 1-2 cups of fresh fruits and vegetables each day. ? 3-4 servings of whole grains each day.  Avoiding foods and beverages that have added sugars.  Eating fish that contain healthy fats (omega-3 fatty acids), such as mackerel or salmon. If you need help putting together a healthy eating plan, try the DASH diet. This diet is high in fruits, vegetables, and whole grains. It is low in sodium, red meat, and added sugars. DASH stands for Dietary Approaches to Stop Hypertension. What lifestyle changes can be made?   Lose weight if you are overweight. Losing just 3?5% of your body weight can help prevent or control hypertension. ? For example, if your present  weight is 200 lb (91 kg), a loss of 3-5% of your weight means losing 6-10 lb (2.7-4.5 kg). ? Ask your health care provider to help you with a diet and exercise plan to safely lose weight.  Get enough exercise. Do at least 150 minutes of moderate-intensity exercise each week. ? You could do this in short exercise sessions several times a day, or you could do longer exercise sessions a few times a week. For example, you could take a brisk 10-minute walk or bike ride, 3 times a day, for 5 days a week.  Find ways to reduce stress, such as exercising, meditating, listening to music, or taking a yoga class. If you need help reducing stress, ask your health care provider.  Do not smoke. This includes e-cigarettes. Chemicals in tobacco and nicotine products raise your blood pressure each time you smoke. If you need help quitting, ask your health care provider.  Avoid alcohol. If you drink alcohol, limit alcohol intake to no more than 1 drink a day for nonpregnant women and 2 drinks a day for men. One drink equals 12 oz of beer, 5 oz of wine, or 1 oz of hard liquor. Why are these changes important? Diet and lifestyle changes can help you prevent hypertension, and they may make you feel better overall and improve your quality of life. If you have hypertension, making these changes will help you control it and help prevent major complications, such as:  Hardening and narrowing of arteries that supply blood to: ? Your heart. This can cause a heart  it and help prevent major complications, such as:   Hardening and narrowing of arteries that supply blood to:  ? Your heart. This can cause a heart attack.  ? Your brain. This can cause a stroke.  ? Your kidneys. This can cause kidney failure.   Stress on your heart muscle, which can cause heart failure.  What can I do to lower my risk?   Work with your health care provider to make a hypertension prevention plan that works for you. Follow your plan and keep all follow-up visits as told by your health care provider.   Learn how to check your blood pressure at home. Make sure that you know your personal target  blood pressure, as told by your health care provider.  How is this treated?  In addition to diet and lifestyle changes, your health care provider may recommend medicines to help lower your blood pressure. You may need to try a few different medicines to find what works best for you. You also may need to take more than one medicine. Take over-the-counter and prescription medicines only as told by your health care provider.  Where to find support  Your health care provider can help you prevent hypertension and help you keep your blood pressure at a healthy level. Your local hospital or your community may also provide support services and prevention programs.  The American Heart Association offers an online support network at: http://supportnetwork.heart.org/high-blood-pressure  Where to find more information  Learn more about hypertension from:   National Heart, Lung, and Blood Institute: www.nhlbi.nih.gov/health/health-topics/topics/hbp   Centers for Disease Control and Prevention: www.cdc.gov/bloodpressure   American Academy of Family Physicians: http://familydoctor.org/familydoctor/en/diseases-conditions/high-blood-pressure.printerview.all.html  Learn more about the DASH diet from:   National Heart, Lung, and Blood Institute: www.nhlbi.nih.gov/health/health-topics/topics/dash  Contact a health care provider if:   You think you are having a reaction to medicines you have taken.   You have recurrent headaches or feel dizzy.   You have swelling in your ankles.   You have trouble with your vision.  Summary   Hypertension often does not cause any symptoms until blood pressure is very high. It is important to get your blood pressure checked regularly.   Diet and lifestyle changes are the most important steps in preventing hypertension.   By keeping your blood pressure in a healthy range, you can prevent complications like heart attack, heart failure, stroke, and kidney failure.   Work with your health care  provider to make a hypertension prevention plan that works for you.  This information is not intended to replace advice given to you by your health care provider. Make sure you discuss any questions you have with your health care provider.  Document Released: 10/21/2015 Document Revised: 06/16/2016 Document Reviewed: 06/16/2016  Elsevier Interactive Patient Education  2019 [employer]

## 2018-11-29 NOTE — Progress Notes (Signed)
PT NOTE:   Pt in good spirits and IND with all mobility tasks.  Pt ambulated 500' unassisted with no SOB, dizziness or instability.  No PT needs identified at this time.  PT will sign off.  Debe Coder PT Acute Rehabilitation Services Pager [phone] Office [phone]

## 2018-11-29 NOTE — Discharge Summary (Signed)
Discharge Summary  Danielle Kent JJK:[id number] DOB: [birth year]  PCP: Patient, No Pcp Per  Admit date: 11/25/2018 Discharge date: 11/29/2018  Time spent: 35 minutes  Recommendations for Outpatient Follow-up:  1. Follow-up with your OB/GYN 2. Follow-up with your PCP 3. Take your medications as prescribed  Discharge Diagnoses:  Active Hospital Problems   Diagnosis Date Noted  . Hypertensive urgency 11/25/2018  . Chest pain   . Supervision of high risk pregnancy, antepartum 11/25/2018    Resolved Hospital Problems  No resolved problems to display.    Discharge Condition: Stable  Diet recommendation: Resume previous diet  Vitals:   11/29/18 1115 11/29/18 1130  BP:  (!) 159/87  Pulse: (!) 112 88  Resp: 19 (!) 23  Temp:    SpO2: 99% 96%    History of present illness:  Danielle Kent a 33 y.o.femalewithhistory of hypertension, asthma who has been recently started back on labetalol after her confirmation of pregnancy last month presents to the ER with complaints of elevated blood pressure. Has been compliant with her labetalol. Last couple of days patient has been getting some chest pressure lasting a few minutes on exertion. Also frontal headache with no focal deficits or visual symptoms.  Hospital course complicated by persistently elevated labile blood pressure.  Maxed out 3 antihypertensives, plus added clonidine 0.2 mg 3 times daily.   Doing okay on this regimen.  11/29/2018: Patient seen and examined at her bedside.  She has no new complaints.  She denies headache, change in vision, chest pain, dyspnea or palpitations.  Advised to regularly check her blood pressure at home or at [facility] to assure that it continues to be well controlled.  Patient understands and agrees to plan.  On the day of discharge, the patient was hemodynamically stable.  She will need to follow-up with her OB/GYN, PCP post hospitalization.  She will also need to take her medications as  prescribed.    Hospital Course:  Principal Problem:   Hypertensive urgency Active Problems:   Supervision of high risk pregnancy, antepartum   Chest pain  Hypertensive urgency in the setting of first trimester pregnancy Presented with blood pressure 251/160, renal function, troponin, mental status, chest x-ray unremarkable Her blood pressure is now at goal Maxed out p.o. labetalol to 300 mg 3 times daily Maxed out p.o. hydralazine to 100 mg 3 times daily Maxed out p.o. nifedipine 20 mg 3 times daily Added clonidine 0.2 mg 3 times daily Continue the following antihypertensive medications: Labetalol 300 mg 3 times a day, hydralazine 100 mg 3 times a day, nifedipine 20 mg 3 times a day, clonidine 0.2 mg 3 times a day. Follow-up with your OB/GYN and PCP post hospitalization.  Resolved chest pain likely secondary to accelerated hypertension Troponin 0.033 Twelve-lead EKG with no specific ST-T changes Cardiology consulted and followed. 2D echo done on 11/26/2018 is essentially unremarkable, normal LVEF, except for mildly dilated left atrium.  First trimester pregnancy-  [redacted] weeks pregnant Being followed by OB/GYN outpatient Patient is G2, P1, with LMP 09/01/2018  Severe morbid obesity BMI 66 Recommend controlling diet in the setting of pregnancy  Resolved hypokalemia post repletion    Code Status:Full code.  Consults called:Cardiology. ER physician discussed with patient's OB/GYN.  Discussed with OB/GYN as well on 11/26/2018, okay to use hydralazine and nifedipine.   Discharge Exam: BP (!) 159/87   Pulse 88   Temp 97.9 F (36.6 C) (Oral)   Resp (!) 23   Ht 5' (1.524 m)  Wt (!) 155 kg   LMP 08/31/2018 (Exact Date)   SpO2 96%   BMI 66.74 kg/m  . General: 33 y.o. year-old female well developed well nourished in no acute distress.  Alert and oriented x3. . Cardiovascular: Regular rate and rhythm with no rubs or gallops.  No thyromegaly or JVD noted.    Marland Kitchen Respiratory: Clear to auscultation with no wheezes or rales. Good inspiratory effort. . Abdomen: Soft nontender nondistended with normal bowel sounds x4 quadrants. . Musculoskeletal: No lower extremity edema. 2/4 pulses in all 4 extremities. . Skin: No ulcerative lesions noted or rashes, . Psychiatry: Mood is appropriate for condition and setting  Discharge Instructions You were cared for by a hospitalist during your hospital stay. If you have any questions about your discharge medications or the care you received while you were in the hospital after you are discharged, you can call the unit and asked to speak with the hospitalist on call if the hospitalist that took care of you is not available. Once you are discharged, your primary care physician will handle any further medical issues. Please note that NO REFILLS for any discharge medications will be authorized once you are discharged, as it is imperative that you return to your primary care physician (or establish a relationship with a primary care physician if you do not have one) for your aftercare needs so that they can reassess your need for medications and monitor your lab values.   Allergies as of 11/29/2018   No Known Allergies     Medication List    TAKE these medications   cloNIDine 0.2 MG tablet Commonly known as:  CATAPRES Take 1 tablet (0.2 mg total) by mouth 3 (three) times daily for 30 days.   hydrALAZINE 100 MG tablet Commonly known as:  APRESOLINE Take 1 tablet (100 mg total) by mouth every 8 (eight) hours for 30 days.   labetalol 300 MG tablet Commonly known as:  NORMODYNE Take 1 tablet (300 mg total) by mouth 3 (three) times daily for 30 days. What changed:    medication strength  how much to take  when to take this   NIFEdipine 20 MG capsule Commonly known as:  PROCARDIA Take 1 capsule (20 mg total) by mouth 3 (three) times daily for 30 days.   Prenatal Vitamin 27-0.8 MG Tabs Take 1 tablet by mouth  daily.      No Known Allergies Follow-up Information    [name], MD. Call in 1 day(s).   Specialty:  Obstetrics and Gynecology Why:  Please call for post hospital follow-up appointment. Contact information: [place] [address] [address] [place]. Call in 1 day(s).   Why:  Please call for a post hospital follow-up appointment. Contact information: [address] [place] [address] [phone]           The results of significant diagnostics from this hospitalization (including imaging, microbiology, ancillary and laboratory) are listed below for reference.    Significant Diagnostic Studies: Dg Chest 2 View  Result Date: 11/25/2018 CLINICAL DATA:  Chest pain x 2 days, elevated BP, non smoker, no cough or congestion, [redacted] weeks pregnant EXAM: CHEST - 2 VIEW COMPARISON:  None. FINDINGS: The heart size and mediastinal contours are within normal limits. Both lungs are clear. No pleural effusion or pneumothorax. The visualized skeletal structures are unremarkable. IMPRESSION: No active cardiopulmonary disease. Electronically Signed  By: Lajean Manes M.D.   On: 11/25/2018 14:39   US Ob Comp < 14 Wks  Result Date: 11/25/2018 CLINICAL DATA:  33 year old female with pelvic pain, chest pain and hypertensive in the 1st trimester of pregnancy. Estimated gestational age by LMP 12 weeks 1 day. Quantitative beta HCG 85,573. EXAM: OBSTETRIC <14 WK ULTRASOUND TECHNIQUE: Transabdominal ultrasound was performed for evaluation of the gestation as well as the maternal uterus and adnexal regions. COMPARISON:  None. FINDINGS: Intrauterine gestational sac: Single Yolk sac:  Not visible Embryo:  Visible Cardiac Activity: Detected Heart Rate: 156 bpm CRL:   75.4 mm   13 w 4 d                  [place] EDC: 05/29/2019 Subchorionic hemorrhage:  None visualized. Maternal uterus/adnexae: Neither ovary is visualized. No  pelvic free fluid identified. IMPRESSION: Single living IUP demonstrated. No acute maternal findings visualized. Electronically Signed   By: Genevie Ann M.D.   On: 11/25/2018 19:13    Microbiology: Recent Results (from the past 240 hour(s))  MRSA PCR Screening     Status: None   Collection Time: 11/25/18  8:55 PM  Result Value Ref Range Status   MRSA by PCR NEGATIVE NEGATIVE Final    Comment:        The GeneXpert MRSA Assay (FDA approved for NASAL specimens only), is one component of a comprehensive MRSA colonization surveillance program. It is not intended to diagnose MRSA infection nor to guide or monitor treatment for MRSA infections. Performed at [facility], [place] [address], [place], [place] [id number]      Labs: Basic Metabolic Panel: Recent Labs  Lab 11/25/18 1450 11/25/18 2302 11/26/18 0534 11/27/18 0727 11/28/18 0417 11/29/18 0354  NA 136  --  136 134* 134* 134*  K 3.1*  --  3.3* 3.7 4.3 4.4  CL 103  --  104 102 103 102  CO2 23  --  23 22 21* 22  GLUCOSE 113*  --  91 96 95 86  BUN 13  --  12 18 15 16   CREATININE 1.01* 1.01* 0.99 1.07* 0.93 1.15*  CALCIUM 9.2  --  8.8* 8.8* 9.1 9.0  MG  --  1.8  --   --   --   --    Liver Function Tests: Recent Labs  Lab 11/25/18 1450 11/26/18 0534 11/27/18 0727 11/28/18 0417 11/29/18 0354  AST 19 17 16 19 21   ALT 15 15 14 14 13   ALKPHOS 61 55 51 54 48  BILITOT 0.4 0.3 0.7 0.3 0.7  PROT 7.6 6.9 6.6 7.0 6.9  ALBUMIN 3.3* 3.1* 3.0* 3.0* 3.1*   No results for input(s): LIPASE, AMYLASE in the last 168 hours. No results for input(s): AMMONIA in the last 168 hours. CBC: Recent Labs  Lab 11/25/18 2302 11/26/18 0534 11/27/18 0727 11/28/18 0417 11/29/18 0354  WBC 9.5 8.0 9.8 8.2 9.1  HGB 12.2 12.2 11.8* 12.5 11.9*  HCT 37.7 38.9 36.9 40.5 38.5  MCV 84.3 84.9 85.6 85.6 87.7  PLT 276 292 290 268 301   Cardiac Enzymes: Recent Labs  Lab 11/25/18 2302 11/26/18 0534 11/26/18 1033  TROPONINI 0.03*  0.03* 0.03*   BNP: BNP (last 3 results) No results for input(s): BNP in the last 8760 hours.  ProBNP (last 3 results) No results for input(s): PROBNP in the last 8760 hours.  CBG: Recent Labs  Lab 11/25/18 2002  GLUCAP 84  Signed:  Kayleen Memos, MD [facility] 11/29/2018, 12:22 PM

## 2018-12-06 ENCOUNTER — Encounter: Payer: Self-pay | Admitting: Obstetrics and Gynecology

## 2018-12-06 ENCOUNTER — Other Ambulatory Visit: Payer: Self-pay

## 2018-12-06 ENCOUNTER — Other Ambulatory Visit (HOSPITAL_COMMUNITY)
Admission: RE | Admit: 2018-12-06 | Discharge: 2018-12-06 | Disposition: A | Discharge status: Home / Self Care | Payer: Medicaid Other | Source: Ambulatory Visit | Attending: Obstetrics and Gynecology | Admitting: Obstetrics and Gynecology

## 2018-12-06 ENCOUNTER — Ambulatory Visit (INDEPENDENT_AMBULATORY_CARE_PROVIDER_SITE_OTHER): Payer: Medicaid Other | Admitting: Obstetrics and Gynecology

## 2018-12-06 DIAGNOSIS — Z98891 History of uterine scar from previous surgery: Secondary | ICD-10-CM

## 2018-12-06 DIAGNOSIS — O0990 Supervision of high risk pregnancy, unspecified, unspecified trimester: Secondary | ICD-10-CM | POA: Diagnosis not present

## 2018-12-06 DIAGNOSIS — O99212 Obesity complicating pregnancy, second trimester: Secondary | ICD-10-CM

## 2018-12-06 DIAGNOSIS — Z3A15 15 weeks gestation of pregnancy: Secondary | ICD-10-CM

## 2018-12-06 DIAGNOSIS — O10919 Unspecified pre-existing hypertension complicating pregnancy, unspecified trimester: Secondary | ICD-10-CM

## 2018-12-06 DIAGNOSIS — O10912 Unspecified pre-existing hypertension complicating pregnancy, second trimester: Secondary | ICD-10-CM

## 2018-12-06 DIAGNOSIS — O0992 Supervision of high risk pregnancy, unspecified, second trimester: Secondary | ICD-10-CM

## 2018-12-06 DIAGNOSIS — O99210 Obesity complicating pregnancy, unspecified trimester: Secondary | ICD-10-CM

## 2018-12-06 MED ORDER — ASPIRIN EC 81 MG PO TBEC: 81.0000 mg | DELAYED_RELEASE_TABLET | Freq: Every day | ORAL | 2 refills | Status: DC

## 2018-12-06 MED ORDER — BUTALBITAL-APAP-CAFFEINE 50-325-40 MG PO CAPS: 1.0000 | ORAL_CAPSULE | Freq: Four times a day (QID) | ORAL | 3 refills | Status: DC | PRN

## 2018-12-06 NOTE — Patient Instructions (Signed)
 Second Trimester of Pregnancy The second trimester is from week 14 through week 27 (months 4 through 6). The second trimester is often a time when you feel your best. Your body has adjusted to being pregnant, and you begin to feel better physically. Usually, morning sickness has lessened or quit completely, you may have more energy, and you may have an increase in appetite. The second trimester is also a time when the fetus is growing rapidly. At the end of the sixth month, the fetus is about 9 inches long and weighs about 1 pounds. You will likely begin to feel the baby move (quickening) between 16 and 20 weeks of pregnancy. Body changes during your second trimester Your body continues to go through many changes during your second trimester. The changes vary from woman to woman.  Your weight will continue to increase. You will notice your lower abdomen bulging out.  You may begin to get stretch marks on your hips, abdomen, and breasts.  You may develop headaches that can be relieved by medicines. The medicines should be approved by your health care provider.  You may urinate more often because the fetus is pressing on your bladder.  You may develop or continue to have heartburn as a result of your pregnancy.  You may develop constipation because certain hormones are causing the muscles that push waste through your intestines to slow down.  You may develop hemorrhoids or swollen, bulging veins (varicose veins).  You may have back pain. This is caused by: ? Weight gain. ? Pregnancy hormones that are relaxing the joints in your pelvis. ? A shift in weight and the muscles that support your balance.  Your breasts will continue to grow and they will continue to become tender.  Your gums may bleed and may be sensitive to brushing and flossing.  Dark spots or blotches (chloasma, mask of pregnancy) may develop on your face. This will likely fade after the baby is born.  A dark line from  your belly button to the pubic area (linea nigra) may appear. This will likely fade after the baby is born.  You may have changes in your hair. These can include thickening of your hair, rapid growth, and changes in texture. Some women also have hair loss during or after pregnancy, or hair that feels dry or thin. Your hair will most likely return to normal after your baby is born. What to expect at prenatal visits During a routine prenatal visit:  You will be weighed to make sure you and the fetus are growing normally.  Your blood pressure will be taken.  Your abdomen will be measured to track your baby's growth.  The fetal heartbeat will be listened to.  Any test results from the previous visit will be discussed. Your health care provider may ask you:  How you are feeling.  If you are feeling the baby move.  If you have had any abnormal symptoms, such as leaking fluid, bleeding, severe headaches, or abdominal cramping.  If you are using any tobacco products, including cigarettes, chewing tobacco, and electronic cigarettes.  If you have any questions. Other tests that may be performed during your second trimester include:  Blood tests that check for: ? Low iron levels (anemia). ? High blood sugar that affects pregnant women (gestational diabetes) between 24 and 28 weeks. ? Rh antibodies. This is to check for a protein on red blood cells (Rh factor).  Urine tests to check for infections, diabetes, or protein in   the urine.  An ultrasound to confirm the proper growth and development of the baby.  An amniocentesis to check for possible genetic problems.  Fetal screens for spina bifida and Down syndrome.  HIV (human immunodeficiency virus) testing. Routine prenatal testing includes screening for HIV, unless you choose not to have this test. Follow these instructions at home: Medicines  Follow your health care provider's instructions regarding medicine use. Specific medicines  may be either safe or unsafe to take during pregnancy.  Take a prenatal vitamin that contains at least 600 micrograms (mcg) of folic acid.  If you develop constipation, try taking a stool softener if your health care provider approves. Eating and drinking   Eat a balanced diet that includes fresh fruits and vegetables, whole grains, good sources of protein such as meat, eggs, or tofu, and low-fat dairy. Your health care provider will help you determine the amount of weight gain that is right for you.  Avoid raw meat and uncooked cheese. These carry germs that can cause birth defects in the baby.  If you have low calcium intake from food, talk to your health care provider about whether you should take a daily calcium supplement.  Limit foods that are high in fat and processed sugars, such as fried and sweet foods.  To prevent constipation: ? Drink enough fluid to keep your urine clear or pale yellow. ? Eat foods that are high in fiber, such as fresh fruits and vegetables, whole grains, and beans. Activity  Exercise only as directed by your health care provider. Most women can continue their usual exercise routine during pregnancy. Try to exercise for 30 minutes at least 5 days a week. Stop exercising if you experience uterine contractions.  Avoid heavy lifting, wear low heel shoes, and practice good posture.  A sexual relationship may be continued unless your health care provider directs you otherwise. Relieving pain and discomfort  Wear a good support bra to prevent discomfort from breast tenderness.  Take warm sitz baths to soothe any pain or discomfort caused by hemorrhoids. Use hemorrhoid cream if your health care provider approves.  Rest with your legs elevated if you have leg cramps or low back pain.  If you develop varicose veins, wear support hose. Elevate your feet for 15 minutes, 3-4 times a day. Limit salt in your diet. Prenatal Care  Write down your questions. Take  them to your prenatal visits.  Keep all your prenatal visits as told by your health care provider. This is important. Safety  Wear your seat belt at all times when driving.  Make a list of emergency phone numbers, including numbers for family, friends, the hospital, and police and fire departments. General instructions  Ask your health care provider for a referral to a local prenatal education class. Begin classes no later than the beginning of month 6 of your pregnancy.  Ask for help if you have counseling or nutritional needs during pregnancy. Your health care provider can offer advice or refer you to specialists for help with various needs.  Do not use hot tubs, steam rooms, or saunas.  Do not douche or use tampons or scented sanitary pads.  Do not cross your legs for long periods of time.  Avoid cat litter boxes and soil used by cats. These carry germs that can cause birth defects in the baby and possibly loss of the fetus by miscarriage or stillbirth.  Avoid all smoking, herbs, alcohol, and unprescribed drugs. Chemicals in these products can affect the   formation and growth of the baby.  Do not use any products that contain nicotine or tobacco, such as cigarettes and e-cigarettes. If you need help quitting, ask your health care provider.  Visit your dentist if you have not gone yet during your pregnancy. Use a soft toothbrush to brush your teeth and be gentle when you floss. Contact a health care provider if:  You have dizziness.  You have mild pelvic cramps, pelvic pressure, or nagging pain in the abdominal area.  You have persistent nausea, vomiting, or diarrhea.  You have a bad smelling vaginal discharge.  You have pain when you urinate. Get help right away if:  You have a fever.  You are leaking fluid from your vagina.  You have spotting or bleeding from your vagina.  You have severe abdominal cramping or pain.  You have rapid weight gain or weight loss.  You  have shortness of breath with chest pain.  You notice sudden or extreme swelling of your face, hands, ankles, feet, or legs.  You have not felt your baby move in over an hour.  You have severe headaches that do not go away when you take medicine.  You have vision changes. Summary  The second trimester is from week 14 through week 27 (months 4 through 6). It is also a time when the fetus is growing rapidly.  Your body goes through many changes during pregnancy. The changes vary from woman to woman.  Avoid all smoking, herbs, alcohol, and unprescribed drugs. These chemicals affect the formation and growth your baby.  Do not use any tobacco products, such as cigarettes, chewing tobacco, and e-cigarettes. If you need help quitting, ask your health care provider.  Contact your health care provider if you have any questions. Keep all prenatal visits as told by your health care provider. This is important. This information is not intended to replace advice given to you by your health care provider. Make sure you discuss any questions you have with your health care provider. Document Released: 09/30/2001 Document Revised: 11/11/2016 Document Reviewed: 11/11/2016 Elsevier Interactive Patient Education  2019 [employer].   Contraception Choices Contraception, also called birth control, refers to methods or devices that prevent pregnancy. Hormonal methods Contraceptive implant  A contraceptive implant is a thin, plastic tube that contains a hormone. It is inserted into the upper part of the arm. It can remain in place for up to 3 years. Progestin-only injections Progestin-only injections are injections of progestin, a synthetic form of the hormone progesterone. They are given every 3 months by a health care provider. Birth control pills  Birth control pills are pills that contain hormones that prevent pregnancy. They must be taken once a day, preferably at the same time each day. Birth  control patch  The birth control patch contains hormones that prevent pregnancy. It is placed on the skin and must be changed once a week for three weeks and removed on the fourth week. A prescription is needed to use this method of contraception. Vaginal ring  A vaginal ring contains hormones that prevent pregnancy. It is placed in the vagina for three weeks and removed on the fourth week. After that, the process is repeated with a new ring. A prescription is needed to use this method of contraception. Emergency contraceptive Emergency contraceptives prevent pregnancy after unprotected sex. They come in pill form and can be taken up to 5 days after sex. They work best the sooner they are taken after having sex. Most  emergency contraceptives are available without a prescription. This method should not be used as your only form of birth control. Barrier methods Female condom  A female condom is a thin sheath that is worn over the penis during sex. Condoms keep sperm from going inside a woman's body. They can be used with a spermicide to increase their effectiveness. They should be disposed after a single use. Female condom  A female condom is a soft, loose-fitting sheath that is put into the vagina before sex. The condom keeps sperm from going inside a woman's body. They should be disposed after a single use. Diaphragm  A diaphragm is a soft, dome-shaped barrier. It is inserted into the vagina before sex, along with a spermicide. The diaphragm blocks sperm from entering the uterus, and the spermicide kills sperm. A diaphragm should be left in the vagina for 6-8 hours after sex and removed within 24 hours. A diaphragm is prescribed and fitted by a health care provider. A diaphragm should be replaced every 1-2 years, after giving birth, after gaining more than 15 lb (6.8 kg), and after pelvic surgery. Cervical cap  A cervical cap is a round, soft latex or plastic cup that fits over the cervix. It is  inserted into the vagina before sex, along with spermicide. It blocks sperm from entering the uterus. The cap should be left in place for 6-8 hours after sex and removed within 48 hours. A cervical cap must be prescribed and fitted by a health care provider. It should be replaced every 2 years. Sponge  A sponge is a soft, circular piece of polyurethane foam with spermicide on it. The sponge helps block sperm from entering the uterus, and the spermicide kills sperm. To use it, you make it wet and then insert it into the vagina. It should be inserted before sex, left in for at least 6 hours after sex, and removed and thrown away within 30 hours. Spermicides Spermicides are chemicals that kill or block sperm from entering the cervix and uterus. They can come as a cream, jelly, suppository, foam, or tablet. A spermicide should be inserted into the vagina with an applicator at least 93-26 minutes before sex to allow time for it to work. The process must be repeated every time you have sex. Spermicides do not require a prescription. Intrauterine contraception Intrauterine device (IUD) An IUD is a T-shaped device that is put in a woman's uterus. There are two types:  Hormone IUD.This type contains progestin, a synthetic form of the hormone progesterone. This type can stay in place for 3-5 years.  Copper IUD.This type is wrapped in copper wire. It can stay in place for 10 years.  Permanent methods of contraception Female tubal ligation In this method, a woman's fallopian tubes are sealed, tied, or blocked during surgery to prevent eggs from traveling to the uterus. Hysteroscopic sterilization In this method, a small, flexible insert is placed into each fallopian tube. The inserts cause scar tissue to form in the fallopian tubes and block them, so sperm cannot reach an egg. The procedure takes about 3 months to be effective. Another form of birth control must be used during those 3 months. Female  sterilization This is a procedure to tie off the tubes that carry sperm (vasectomy). After the procedure, the man can still ejaculate fluid (semen). Natural planning methods Natural family planning In this method, a couple does not have sex on days when the woman could become pregnant. Calendar method This means keeping  track of the length of each menstrual cycle, identifying the days when pregnancy can happen, and not having sex on those days. Ovulation method In this method, a couple avoids sex during ovulation. Symptothermal method This method involves not having sex during ovulation. The woman typically checks for ovulation by watching changes in her temperature and in the consistency of cervical mucus. Post-ovulation method In this method, a couple waits to have sex until after ovulation. Summary  Contraception, also called birth control, means methods or devices that prevent pregnancy.  Hormonal methods of contraception include implants, injections, pills, patches, vaginal rings, and emergency contraceptives.  Barrier methods of contraception can include female condoms, female condoms, diaphragms, cervical caps, sponges, and spermicides.  There are two types of IUDs (intrauterine devices). An IUD can be put in a woman's uterus to prevent pregnancy for 3-5 years.  Permanent sterilization can be done through a procedure for males, females, or both.  Natural family planning methods involve not having sex on days when the woman could become pregnant. This information is not intended to replace advice given to you by your health care provider. Make sure you discuss any questions you have with your health care provider. Document Released: 10/06/2005 Document Revised: 10/08/2017 Document Reviewed: 11/08/2016 Elsevier Interactive Patient Education  2019 [employer].   Breastfeeding  Choosing to breastfeed is one of the best decisions you can make for yourself and your baby. A change in  hormones during pregnancy causes your breasts to make breast milk in your milk-producing glands. Hormones prevent breast milk from being released before your baby is born. They also prompt milk flow after birth. Once breastfeeding has begun, thoughts of your baby, as well as his or her sucking or crying, can stimulate the release of milk from your milk-producing glands. Benefits of breastfeeding Research shows that breastfeeding offers many health benefits for infants and mothers. It also offers a cost-free and convenient way to feed your baby. For your baby  Your first milk (colostrum) helps your baby's digestive system to function better.  Special cells in your milk (antibodies) help your baby to fight off infections.  Breastfed babies are less likely to develop asthma, allergies, obesity, or type 2 diabetes. They are also at lower risk for sudden infant death syndrome (SIDS).  Nutrients in breast milk are better able to meet your baby's needs compared to infant formula.  Breast milk improves your baby's brain development. For you  Breastfeeding helps to create a very special bond between you and your baby.  Breastfeeding is convenient. Breast milk costs nothing and is always available at the correct temperature.  Breastfeeding helps to burn calories. It helps you to lose the weight that you gained during pregnancy.  Breastfeeding makes your uterus return faster to its size before pregnancy. It also slows bleeding (lochia) after you give birth.  Breastfeeding helps to lower your risk of developing type 2 diabetes, osteoporosis, rheumatoid arthritis, cardiovascular disease, and breast, ovarian, uterine, and endometrial cancer later in life. Breastfeeding basics Starting breastfeeding  Find a comfortable place to sit or lie down, with your neck and back well-supported.  Place a pillow or a rolled-up blanket under your baby to bring him or her to the level of your breast (if you are  seated). Nursing pillows are specially designed to help support your arms and your baby while you breastfeed.  Make sure that your baby's tummy (abdomen) is facing your abdomen.  Gently massage your breast. With your fingertips, massage from  the outer edges of your breast inward toward the nipple. This encourages milk flow. If your milk flows slowly, you may need to continue this action during the feeding.  Support your breast with 4 fingers underneath and your thumb above your nipple (make the letter "C" with your hand). Make sure your fingers are well away from your nipple and your baby's mouth.  Stroke your baby's lips gently with your finger or nipple.  When your baby's mouth is open wide enough, quickly bring your baby to your breast, placing your entire nipple and as much of the areola as possible into your baby's mouth. The areola is the colored area around your nipple. ? More areola should be visible above your baby's upper lip than below the lower lip. ? Your baby's lips should be opened and extended outward (flanged) to ensure an adequate, comfortable latch. ? Your baby's tongue should be between his or her lower gum and your breast.  Make sure that your baby's mouth is correctly positioned around your nipple (latched). Your baby's lips should create a seal on your breast and be turned out (everted).  It is common for your baby to suck about 2-3 minutes in order to start the flow of breast milk. Latching Teaching your baby how to latch onto your breast properly is very important. An improper latch can cause nipple pain, decreased milk supply, and poor weight gain in your baby. Also, if your baby is not latched onto your nipple properly, he or she may swallow some air during feeding. This can make your baby fussy. Burping your baby when you switch breasts during the feeding can help to get rid of the air. However, teaching your baby to latch on properly is still the best way to prevent  fussiness from swallowing air while breastfeeding. Signs that your baby has successfully latched onto your nipple  Silent tugging or silent sucking, without causing you pain. Infant's lips should be extended outward (flanged).  Swallowing heard between every 3-4 sucks once your milk has started to flow (after your let-down milk reflex occurs).  Muscle movement above and in front of his or her ears while sucking. Signs that your baby has not successfully latched onto your nipple  Sucking sounds or smacking sounds from your baby while breastfeeding.  Nipple pain. If you think your baby has not latched on correctly, slip your finger into the corner of your baby's mouth to break the suction and place it between your baby's gums. Attempt to start breastfeeding again. Signs of successful breastfeeding Signs from your baby  Your baby will gradually decrease the number of sucks or will completely stop sucking.  Your baby will fall asleep.  Your baby's body will relax.  Your baby will retain a small amount of milk in his or her mouth.  Your baby will let go of your breast by himself or herself. Signs from you  Breasts that have increased in firmness, weight, and size 1-3 hours after feeding.  Breasts that are softer immediately after breastfeeding.  Increased milk volume, as well as a change in milk consistency and color by the fifth day of breastfeeding.  Nipples that are not sore, cracked, or bleeding. Signs that your baby is getting enough milk  Wetting at least 1-2 diapers during the first 24 hours after birth.  Wetting at least 5-6 diapers every 24 hours for the first week after birth. The urine should be clear or pale yellow by the age of 5 days.  Wetting 6-8 diapers every 24 hours as your baby continues to grow and develop.  At least 3 stools in a 24-hour period by the age of 5 days. The stool should be soft and yellow.  At least 3 stools in a 24-hour period by the age of 7  days. The stool should be seedy and yellow.  No loss of weight greater than 10% of birth weight during the first 3 days of life.  Average weight gain of 4-7 oz (113-198 g) per week after the age of 4 days.  Consistent daily weight gain by the age of 5 days, without weight loss after the age of 2 weeks. After a feeding, your baby may spit up a small amount of milk. This is normal. Breastfeeding frequency and duration Frequent feeding will help you make more milk and can prevent sore nipples and extremely full breasts (breast engorgement). Breastfeed when you feel the need to reduce the fullness of your breasts or when your baby shows signs of hunger. This is called "breastfeeding on demand." Signs that your baby is hungry include:  Increased alertness, activity, or restlessness.  Movement of the head from side to side.  Opening of the mouth when the corner of the mouth or cheek is stroked (rooting).  Increased sucking sounds, smacking lips, cooing, sighing, or squeaking.  Hand-to-mouth movements and sucking on fingers or hands.  Fussing or crying. Avoid introducing a pacifier to your baby in the first 4-6 weeks after your baby is born. After this time, you may choose to use a pacifier. Research has shown that pacifier use during the first year of a baby's life decreases the risk of sudden infant death syndrome (SIDS). Allow your baby to feed on each breast as long as he or she wants. When your baby unlatches or falls asleep while feeding from the first breast, offer the second breast. Because newborns are often sleepy in the first few weeks of life, you may need to awaken your baby to get him or her to feed. Breastfeeding times will vary from baby to baby. However, the following rules can serve as a guide to help you make sure that your baby is properly fed:  Newborns (babies 49 weeks of age or younger) may breastfeed every 1-3 hours.  Newborns should not go without breastfeeding for longer  than 3 hours during the day or 5 hours during the night.  You should breastfeed your baby a minimum of 8 times in a 24-hour period. Breast milk pumping     Pumping and storing breast milk allows you to make sure that your baby is exclusively fed your breast milk, even at times when you are unable to breastfeed. This is especially important if you go back to work while you are still breastfeeding, or if you are not able to be present during feedings. Your lactation consultant can help you find a method of pumping that works best for you and give you guidelines about how long it is safe to store breast milk. Caring for your breasts while you breastfeed Nipples can become dry, cracked, and sore while breastfeeding. The following recommendations can help keep your breasts moisturized and healthy:  Avoid using soap on your nipples.  Wear a supportive bra designed especially for nursing. Avoid wearing underwire-style bras or extremely tight bras (sports bras).  Air-dry your nipples for 3-4 minutes after each feeding.  Use only cotton bra pads to absorb leaked breast milk. Leaking of breast milk between feedings is  normal.  Use lanolin on your nipples after breastfeeding. Lanolin helps to maintain your skin's normal moisture barrier. Pure lanolin is not harmful (not toxic) to your baby. You may also hand express a few drops of breast milk and gently massage that milk into your nipples and allow the milk to air-dry. In the first few weeks after giving birth, some women experience breast engorgement. Engorgement can make your breasts feel heavy, warm, and tender to the touch. Engorgement peaks within 3-5 days after you give birth. The following recommendations can help to ease engorgement:  Completely empty your breasts while breastfeeding or pumping. You may want to start by applying warm, moist heat (in the shower or with warm, water-soaked hand towels) just before feeding or pumping. This increases  circulation and helps the milk flow. If your baby does not completely empty your breasts while breastfeeding, pump any extra milk after he or she is finished.  Apply ice packs to your breasts immediately after breastfeeding or pumping, unless this is too uncomfortable for you. To do this: ? Put ice in a plastic bag. ? Place a towel between your skin and the bag. ? Leave the ice on for 20 minutes, 2-3 times a day.  Make sure that your baby is latched on and positioned properly while breastfeeding. If engorgement persists after 48 hours of following these recommendations, contact your health care provider or a Science writer. Overall health care recommendations while breastfeeding  Eat 3 healthy meals and 3 snacks every day. Well-nourished mothers who are breastfeeding need an additional 450-500 calories a day. You can meet this requirement by increasing the amount of a balanced diet that you eat.  Drink enough water to keep your urine pale yellow or clear.  Rest often, relax, and continue to take your prenatal vitamins to prevent fatigue, stress, and low vitamin and mineral levels in your body (nutrient deficiencies).  Do not use any products that contain nicotine or tobacco, such as cigarettes and e-cigarettes. Your baby may be harmed by chemicals from cigarettes that pass into breast milk and exposure to secondhand smoke. If you need help quitting, ask your health care provider.  Avoid alcohol.  Do not use illegal drugs or marijuana.  Talk with your health care provider before taking any medicines. These include over-the-counter and prescription medicines as well as vitamins and herbal supplements. Some medicines that may be harmful to your baby can pass through breast milk.  It is possible to become pregnant while breastfeeding. If birth control is desired, ask your health care provider about options that will be safe while breastfeeding your baby. Where to find more  information: [employer]: www.llli.org Contact a health care provider if:  You feel like you want to stop breastfeeding or have become frustrated with breastfeeding.  Your nipples are cracked or bleeding.  Your breasts are red, tender, or warm.  You have: ? Painful breasts or nipples. ? A swollen area on either breast. ? A fever or chills. ? Nausea or vomiting. ? Drainage other than breast milk from your nipples.  Your breasts do not become full before feedings by the fifth day after you give birth.  You feel sad and depressed.  Your baby is: ? Too sleepy to eat well. ? Having trouble sleeping. ? More than 16 week old and wetting fewer than 6 diapers in a 24-hour period. ? Not gaining weight by 87 days of age.  Your baby has fewer than 3 stools in a  24-hour period.  Your baby's skin or the white parts of his or her eyes become yellow. Get help right away if:  Your baby is overly tired (lethargic) and does not want to wake up and feed.  Your baby develops an unexplained fever. Summary  Breastfeeding offers many health benefits for infant and mothers.  Try to breastfeed your infant when he or she shows early signs of hunger.  Gently tickle or stroke your baby's lips with your finger or nipple to allow the baby to open his or her mouth. Bring the baby to your breast. Make sure that much of the areola is in your baby's mouth. Offer one side and burp the baby before you offer the other side.  Talk with your health care provider or lactation consultant if you have questions or you face problems as you breastfeed. This information is not intended to replace advice given to you by your health care provider. Make sure you discuss any questions you have with your health care provider. Document Released: 10/06/2005 Document Revised: 11/07/2016 Document Reviewed: 11/07/2016 Elsevier Interactive Patient Education  2019 [employer].   Vaginal Birth After Cesarean  Delivery  Vaginal birth after cesarean delivery (VBAC) is giving birth vaginally after previously delivering a baby through a cesarean section (C-section). A VBAC may be a safe option for you, depending on your health and other factors. It is important to discuss VBAC with your health care provider early in your pregnancy so you can understand the risks, benefits, and options. Having these discussions early will give you time to make your birth plan. Who are the best candidates for VBAC? The best candidates for VBAC are women who:  Have had one or two prior cesarean deliveries, and the incision made during the delivery was horizontal (low transverse).  Do not have a vertical (classical) scar on their uterus.  Have not had a tear in the wall of their uterus (uterine rupture).  Plan to have more pregnancies. A VBAC is also more likely to be successful:  In women who have previously given birth vaginally.  When labor starts by itself (spontaneously) before the due date. What are the benefits of VBAC? The benefits of delivering your baby vaginally instead of by a cesarean delivery include:  A shorter hospital stay.  A faster recovery time.  Less pain.  Avoiding risks associated with major surgery, such as infection and blood clots.  Less blood loss and less need for donated blood (transfusions). What are the risks of VBAC? The main risk of attempting a VBAC is that it may fail, forcing your health care provider to deliver your baby by a C-section. Other risks are rare and include:  Tearing (rupture) of the scar from a past cesarean delivery.  Other risks associated with vaginal deliveries. If a repeat cesarean delivery is needed, the risks include:  Blood loss.  Infection.  Blood clot.  Damage to surrounding organs.  Removal of the uterus (hysterectomy), if it is damaged.  Placenta problems in future pregnancies. What else should I know about my options? Delivering a  baby through a VBAC is similar to having a normal spontaneous vaginal delivery. Therefore, it is safe:  To try with twins.  For your health care provider to try to turn the baby from a breech position (external cephalic version) during labor.  With epidural analgesia for pain relief. Consider where you would like to deliver your baby. VBAC should be attempted in facilities where an emergency cesarean delivery  can be performed. VBAC is not recommended for home births. Any changes in your health or your baby's health during your pregnancy may make it necessary to change your initial decision about VBAC. Your health care provider may recommend that you do not attempt a VBAC if:  Your baby's suspected weight is 8.8 lb (4 kg) or more.  You have preeclampsia. This is a condition that causes high blood pressure along with other symptoms, such as swelling and headaches.  You will have VBAC less than 19 months after your cesarean delivery.  You are past your due date.  You need to have labor started (induced) because your cervix is not ready for labor (unfavorable). Where to find more information  American Pregnancy Association: americanpregnancy.org  [employer] of Obstetricians and Gynecologists: acog.org Summary  Vaginal birth after cesarean delivery (VBAC) is giving birth vaginally after previously delivering a baby through a cesarean section (C-section). A VBAC may be a safe option for you, depending on your health and other factors.  Discuss VBAC with your health care provider early in your pregnancy so you can understand the risks, benefits, options, and have plenty of time to make your birth plan.  The main risk of attempting a VBAC is that it may fail, forcing your health care provider to deliver your baby by a C-section. Other risks are rare. This information is not intended to replace advice given to you by your health care provider. Make sure you discuss any questions you have  with your health care provider. Document Released: 03/29/2007 Document Revised: 01/13/2017 Document Reviewed: 01/13/2017 Elsevier Interactive Patient Education  2019 [employer].

## 2018-12-06 NOTE — Progress Notes (Signed)
  Subjective:    Danielle Kent is a G2P1001 [redacted]w[redacted]d being seen today for her first obstetrical visit.  Her obstetrical history is significant for obesity and Chronic hypertension and previous cesarean section. Patient does intend to breast feed. Pregnancy history fully reviewed.  Patient reports frequent daily headaches not always relieved with tylenol.  Vitals:   12/06/18 1032 12/06/18 1034  BP: (!) 179/122 138/88  Pulse: (!) 106 (!) 105  Temp: (!) 97 F (36.1 C)   Weight: (!) 343 lb 12.8 oz (155.9 kg)     HISTORY: OB History  Gravida Para Term Preterm AB Living  2 1 1     1   SAB TAB Ectopic Multiple Live Births          1    # Outcome Date GA Lbr Len/2nd Weight Sex Delivery Anes PTL Lv  2 Current           1 Term 06/06/10    F CS-LTranv   LIV     Complications: Gestational hypertension   Past Medical History:  Diagnosis Date  . Asthma   . Hypertension    Past Surgical History:  Procedure Laterality Date  . CESAREAN SECTION     Family History  Problem Relation Age of Onset  . Diabetes Mother   . Hypertension Mother   . Multiple sclerosis Mother   . Asthma Father   . Heart attack Maternal Grandfather 57     Exam    Uterus:   Limited due to body habitus  Pelvic Exam:    Perineum: No Hemorrhoids, Normal Perineum   Vulva: normal   Vagina:  normal mucosa, normal discharge   pH:    Cervix: multiparous appearance and cervix is closed and long   Adnexa: normal adnexa and no mass, fullness, tenderness   Bony Pelvis: gynecoid  System: Breast:  normal appearance, no masses or tenderness   Skin: normal coloration and turgor, no rashes    Neurologic: oriented, no focal deficits   Extremities: normal strength, tone, and muscle mass   HEENT extra ocular movement intact   Mouth/Teeth mucous membranes moist, pharynx normal without lesions and dental hygiene good   Neck supple and no masses   Cardiovascular: regular rate and rhythm   Respiratory:  appears well, vitals  normal, no respiratory distress, acyanotic, normal RR, chest clear, no wheezing, crepitations, rhonchi, normal symmetric air entry   Abdomen: Obese   Urinary:       Assessment:    Pregnancy: G2P1001 Patient Active Problem List   Diagnosis Date Noted  . Previous cesarean section 12/06/2018  . Chronic hypertension during pregnancy, antepartum 12/06/2018  . Maternal morbid obesity, antepartum ([place]) 12/06/2018  . Chest pain   . Supervision of high risk pregnancy, antepartum 11/25/2018  . Hypertensive urgency 11/25/2018        Plan:     Initial labs drawn. Prenatal vitamins. Problem list reviewed and updated. Genetic Screening discussed : panorama ordered .  Ultrasound discussed; fetal survey: ordered. Rx ASA provided rx Fioricet provided Baseline labs ordered Records from previous c-section requested  Follow up in 4 weeks. 50% of 30 min visit spent on counseling and coordination of care.     Danielle Kent 12/06/2018

## 2018-12-07 LAB — PROTEIN / CREATININE RATIO, URINE
Creatinine, Urine: 262.6 mg/dL
Protein, Ur: 34.3 mg/dL
Protein/Creat Ratio: 131 mg/g creat (ref 0–200)

## 2018-12-07 LAB — CERVICOVAGINAL ANCILLARY ONLY
Bacterial vaginitis: POSITIVE — AB
Candida vaginitis: POSITIVE — AB
Chlamydia: NEGATIVE
Neisseria Gonorrhea: NEGATIVE
Trichomonas: NEGATIVE

## 2018-12-08 LAB — URINE CULTURE, OB REFLEX

## 2018-12-08 LAB — CULTURE, OB URINE

## 2018-12-08 MED ORDER — TERCONAZOLE 0.8 % VA CREA: 1.0000 | TOPICAL_CREAM | Freq: Every day | VAGINAL | 0 refills | Status: DC

## 2018-12-08 MED ORDER — METRONIDAZOLE 500 MG PO TABS: 500.0000 mg | ORAL_TABLET | Freq: Two times a day (BID) | ORAL | 0 refills | Status: DC

## 2018-12-08 NOTE — Addendum Note (Signed)
Addended by: Mora Bellman on: 12/08/2018 11:58 AM   Modules accepted: Orders

## 2018-12-09 LAB — CYTOLOGY - PAP
Diagnosis: NEGATIVE
HPV: NOT DETECTED

## 2018-12-16 ENCOUNTER — Telehealth: Payer: Self-pay

## 2018-12-16 DIAGNOSIS — R7303 Prediabetes: Secondary | ICD-10-CM

## 2018-12-16 LAB — COMPREHENSIVE METABOLIC PANEL
ALT: 14 IU/L (ref 0–32)
AST: 13 IU/L (ref 0–40)
Albumin/Globulin Ratio: 1.2 (ref 1.2–2.2)
Albumin: 3.9 g/dL (ref 3.8–4.8)
Alkaline Phosphatase: 77 IU/L (ref 39–117)
BUN/Creatinine Ratio: 14 (ref 9–23)
BUN: 19 mg/dL (ref 6–20)
Bilirubin Total: <0.2 mg/dL (ref 0.0–1.2)
CO2: 23 mmol/L (ref 20–29)
Calcium: 9.5 mg/dL (ref 8.7–10.2)
Chloride: 98 mmol/L (ref 96–106)
Creatinine, Ser: 1.33 mg/dL — ABNORMAL HIGH (ref 0.57–1.00)
GFR calc Af Amer: 61 mL/min/{1.73_m2} (ref >59)
GFR calc non Af Amer: 53 mL/min/{1.73_m2} — ABNORMAL LOW (ref >59)
Globulin, Total: 3.2 g/dL (ref 1.5–4.5)
Glucose: 89 mg/dL (ref 65–99)
Potassium: 4.6 mmol/L (ref 3.5–5.2)
Sodium: 136 mmol/L (ref 134–144)
Total Protein: 7.1 g/dL (ref 6.0–8.5)

## 2018-12-16 LAB — OBSTETRIC PANEL, INCLUDING HIV
Antibody Screen: NEGATIVE
Basophils Absolute: 0.1 10*3/uL (ref 0.0–0.2)
Basos: 1 %
EOS (ABSOLUTE): 0.3 10*3/uL (ref 0.0–0.4)
Eos: 2 %
HIV Screen 4th Generation wRfx: NONREACTIVE
Hematocrit: 38.8 % (ref 34.0–46.6)
Hemoglobin: 12.8 g/dL (ref 11.1–15.9)
Hepatitis B Surface Ag: NEGATIVE
Immature Grans (Abs): 0 10*3/uL (ref 0.0–0.1)
Immature Granulocytes: 0 %
Lymphocytes Absolute: 2.2 10*3/uL (ref 0.7–3.1)
Lymphs: 19 %
MCH: 26.7 pg (ref 26.6–33.0)
MCHC: 33 g/dL (ref 31.5–35.7)
MCV: 81 fL (ref 79–97)
Monocytes Absolute: 0.9 10*3/uL (ref 0.1–0.9)
Monocytes: 7 %
Neutrophils Absolute: 8.2 10*3/uL — ABNORMAL HIGH (ref 1.4–7.0)
Neutrophils: 71 %
Platelets: 391 10*3/uL (ref 150–450)
RBC: 4.8 x10E6/uL (ref 3.77–5.28)
RDW: 13.8 % (ref 11.7–15.4)
RPR Ser Ql: NONREACTIVE
Rh Factor: POSITIVE
Rubella Antibodies, IGG: 3.14 index (ref >0.99)
WBC: 11.6 10*3/uL — ABNORMAL HIGH (ref 3.4–10.8)

## 2018-12-16 LAB — SMN1 COPY NUMBER ANALYSIS (SMA CARRIER SCREENING)

## 2018-12-16 LAB — HEMOGLOBIN A1C
ESTIMATED AVERAGE GLUCOSE: 123 mg/dL
Hgb A1c MFr Bld: 5.9 % — ABNORMAL HIGH (ref 4.8–5.6)

## 2018-12-16 LAB — HEMOGLOBINOPATHY EVALUATION
HGB C: 0 %
HGB S: 0 %
HGB VARIANT: 0 %
Hemoglobin A2 Quantitation: 2.3 % (ref 1.8–3.2)
Hemoglobin F Quantitation: 0 % (ref 0.0–2.0)
Hgb A: 97.7 % (ref 96.4–98.8)

## 2018-12-16 LAB — TSH: TSH: 2.58 u[IU]/mL (ref 0.450–4.500)

## 2018-12-16 NOTE — Telephone Encounter (Signed)
-----   Message from Mora Bellman, MD sent at 12/16/2018  7:12 AM EST ----- Patient is pre-diabetic based on blood test during her ob visit. Please have her come in for a fasting 2 hour test before her next appointment  Thanks  Peggy

## 2018-12-16 NOTE — Telephone Encounter (Signed)
Per provider - called patient advised of lab results - need to schedule 2 hour fasting lab test.   Lab appt scheduled for 12/23/2018 - 8 am. Patient was advised of fasting protocol. Patient stated she understood and no further questions.  Future lab orders have been placed in systen.

## 2018-12-20 ENCOUNTER — Encounter: Payer: Self-pay | Admitting: Obstetrics and Gynecology

## 2018-12-23 ENCOUNTER — Other Ambulatory Visit: Payer: Medicaid Other

## 2018-12-23 DIAGNOSIS — R7303 Prediabetes: Secondary | ICD-10-CM

## 2018-12-24 LAB — GLUCOSE TOLERANCE, 2 HOURS W/ 1HR
Glucose, 1 hour: 164 mg/dL (ref 65–179)
Glucose, 2 hour: 85 mg/dL (ref 65–152)
Glucose, Fasting: 94 mg/dL — ABNORMAL HIGH (ref 65–91)

## 2018-12-27 ENCOUNTER — Encounter: Payer: Self-pay | Admitting: Obstetrics and Gynecology

## 2018-12-27 ENCOUNTER — Other Ambulatory Visit: Payer: Self-pay | Admitting: Obstetrics and Gynecology

## 2018-12-27 DIAGNOSIS — O24419 Gestational diabetes mellitus in pregnancy, unspecified control: Secondary | ICD-10-CM

## 2018-12-27 DIAGNOSIS — O24319 Unspecified pre-existing diabetes mellitus in pregnancy, unspecified trimester: Secondary | ICD-10-CM | POA: Insufficient documentation

## 2018-12-27 MED ORDER — ACCU-CHEK FASTCLIX LANCETS MISC: 1.0000 [IU] | Freq: Four times a day (QID) | 12 refills | Status: DC

## 2018-12-27 MED ORDER — ACCU-CHEK NANO SMARTVIEW W/DEVICE KIT: 1.0000 | PACK | 0 refills | Status: DC

## 2018-12-27 MED ORDER — GLUCOSE BLOOD VI STRP: ORAL_STRIP | 12 refills | Status: DC

## 2018-12-28 ENCOUNTER — Encounter (HOSPITAL_COMMUNITY): Payer: Self-pay

## 2019-01-03 ENCOUNTER — Other Ambulatory Visit: Payer: Self-pay

## 2019-01-03 ENCOUNTER — Encounter: Payer: Self-pay | Admitting: Obstetrics and Gynecology

## 2019-01-03 ENCOUNTER — Ambulatory Visit (INDEPENDENT_AMBULATORY_CARE_PROVIDER_SITE_OTHER): Payer: Medicaid Other | Admitting: Obstetrics and Gynecology

## 2019-01-03 VITALS — BP 195/136 | HR 92 | Wt 356.2 lb

## 2019-01-03 DIAGNOSIS — O99212 Obesity complicating pregnancy, second trimester: Secondary | ICD-10-CM

## 2019-01-03 DIAGNOSIS — O24419 Gestational diabetes mellitus in pregnancy, unspecified control: Secondary | ICD-10-CM

## 2019-01-03 DIAGNOSIS — O10912 Unspecified pre-existing hypertension complicating pregnancy, second trimester: Secondary | ICD-10-CM

## 2019-01-03 DIAGNOSIS — Z3A19 19 weeks gestation of pregnancy: Secondary | ICD-10-CM

## 2019-01-03 DIAGNOSIS — O0990 Supervision of high risk pregnancy, unspecified, unspecified trimester: Secondary | ICD-10-CM

## 2019-01-03 DIAGNOSIS — O99210 Obesity complicating pregnancy, unspecified trimester: Secondary | ICD-10-CM

## 2019-01-03 DIAGNOSIS — Z98891 History of uterine scar from previous surgery: Secondary | ICD-10-CM

## 2019-01-03 DIAGNOSIS — O10919 Unspecified pre-existing hypertension complicating pregnancy, unspecified trimester: Secondary | ICD-10-CM

## 2019-01-03 MED ORDER — METHYLDOPA 250 MG PO TABS: 250.0000 mg | ORAL_TABLET | Freq: Three times a day (TID) | ORAL | 3 refills | Status: DC

## 2019-01-03 MED ORDER — BUTALBITAL-APAP-CAFFEINE 50-325-40 MG PO CAPS: 1.0000 | ORAL_CAPSULE | Freq: Four times a day (QID) | ORAL | 3 refills | Status: DC | PRN

## 2019-01-03 NOTE — Progress Notes (Signed)
   PRENATAL VISIT NOTE  Subjective:  Danielle Kent is a 33 y.o. G2P1001 at [redacted]w[redacted]d being seen today for ongoing prenatal care.  She is currently monitored for the following issues for this high-risk pregnancy and has Supervision of high risk pregnancy, antepartum; Hypertensive urgency; Chest pain; Previous cesarean section; Chronic hypertension during pregnancy, antepartum; Maternal morbid obesity, antepartum ([place]); and Gestational diabetes mellitus (GDM) affecting pregnancy, antepartum on their problem list.  Patient reports no complaints.  Contractions: Not present. Vag. Bleeding: None.   . Denies leaking of fluid.   The following portions of the patient's history were reviewed and updated as appropriate: allergies, current medications, past family history, past medical history, past social history, past surgical history and problem list.   Objective:   Vitals:   01/03/19 0853  BP: (!) 195/136  Pulse: 92  Weight: (!) 356 lb 3.2 oz (161.6 kg)    Fetal Status: Fetal Heart Rate (bpm): 150         General:  Alert, oriented and cooperative. Patient is in no acute distress.  Skin: Skin is warm and dry. No rash noted.   Cardiovascular: Normal heart rate noted  Respiratory: Normal respiratory effort, no problems with respiration noted  Abdomen: Soft, gravid, appropriate for gestational age.  Pain/Pressure: Present     Pelvic: Cervical exam deferred        Extremities: Normal range of motion.  Edema: None  Mental Status: Normal mood and affect. Normal behavior. Normal judgment and thought content.   Assessment and Plan:  Pregnancy: G2P1001 at [redacted]w[redacted]d 1. Supervision of high risk pregnancy, antepartum Patient is without complaints AFP today Anatomy ultrasound tomorrow - AFP, Serum, Open Spina Bifida  2. Chronic hypertension during pregnancy, antepartum Patient admits to not taking her blood pressure medication today. She does not like the way labetalol makes her feels and therefore only takes  it at bedtime Labetalol discontinue and aldomet 250mg  TID started instead Patient declined MAU evaluation today despite my recommendations Patient to follow up tomorrow morning for anatomy ultrasound and BP check. Patient is aware that she may be asked to go to MAU tomorrow and is willing to do that  3. Gestational diabetes mellitus (GDM) affecting pregnancy, antepartum Patient made aware of diagnosis of diabetes in pregnancy Patient scheduled to see diabetic educator on Wednesday  4. Maternal morbid obesity, antepartum ([place])   5. Previous cesarean section Will discuss TOLAC at a later visit  Preterm labor symptoms and general obstetric precautions including but not limited to vaginal bleeding, contractions, leaking of fluid and fetal movement were reviewed in detail with the patient. Please refer to After Visit Summary for other counseling recommendations.   Return in about 4 weeks (around 01/31/2019) for ROB.  Future Appointments  Date Time Provider [place]  01/04/2019  9:15 AM [place] [facility]  01/04/2019  9:30 AM [place] [facility]  01/05/2019  3:15 PM [facility] GDM CLASS [facility] NDM    Mora Bellman, MD

## 2019-01-03 NOTE — Progress Notes (Signed)
Pt is here for ROB. [redacted]w[redacted]d.

## 2019-01-04 ENCOUNTER — Other Ambulatory Visit: Payer: Self-pay

## 2019-01-04 ENCOUNTER — Other Ambulatory Visit (HOSPITAL_COMMUNITY): Payer: Self-pay | Admitting: *Deleted

## 2019-01-04 ENCOUNTER — Ambulatory Visit (HOSPITAL_COMMUNITY)
Admission: RE | Admit: 2019-01-04 | Discharge: 2019-01-04 | Disposition: A | Discharge status: Home / Self Care | Payer: Medicaid Other | Source: Ambulatory Visit | Attending: Obstetrics and Gynecology | Admitting: Obstetrics and Gynecology

## 2019-01-04 ENCOUNTER — Encounter (HOSPITAL_COMMUNITY): Payer: Self-pay

## 2019-01-04 ENCOUNTER — Ambulatory Visit (HOSPITAL_COMMUNITY): Payer: Medicaid Other | Admitting: *Deleted

## 2019-01-04 VITALS — BP 162/107 | HR 103 | Temp 98.2°F | Wt 353.4 lb

## 2019-01-04 DIAGNOSIS — O3412 Maternal care for benign tumor of corpus uteri, second trimester: Secondary | ICD-10-CM

## 2019-01-04 DIAGNOSIS — O24410 Gestational diabetes mellitus in pregnancy, diet controlled: Secondary | ICD-10-CM

## 2019-01-04 DIAGNOSIS — O99212 Obesity complicating pregnancy, second trimester: Secondary | ICD-10-CM

## 2019-01-04 DIAGNOSIS — O10012 Pre-existing essential hypertension complicating pregnancy, second trimester: Secondary | ICD-10-CM

## 2019-01-04 DIAGNOSIS — O34219 Maternal care for unspecified type scar from previous cesarean delivery: Secondary | ICD-10-CM

## 2019-01-04 DIAGNOSIS — O0990 Supervision of high risk pregnancy, unspecified, unspecified trimester: Secondary | ICD-10-CM | POA: Diagnosis present

## 2019-01-04 DIAGNOSIS — O10919 Unspecified pre-existing hypertension complicating pregnancy, unspecified trimester: Secondary | ICD-10-CM | POA: Diagnosis present

## 2019-01-04 DIAGNOSIS — Z3A19 19 weeks gestation of pregnancy: Secondary | ICD-10-CM

## 2019-01-04 DIAGNOSIS — O10912 Unspecified pre-existing hypertension complicating pregnancy, second trimester: Secondary | ICD-10-CM

## 2019-01-04 DIAGNOSIS — Z363 Encounter for antenatal screening for malformations: Secondary | ICD-10-CM

## 2019-01-04 DIAGNOSIS — D259 Leiomyoma of uterus, unspecified: Secondary | ICD-10-CM

## 2019-01-05 ENCOUNTER — Ambulatory Visit: Payer: Medicaid Other | Admitting: Registered"

## 2019-01-05 ENCOUNTER — Ambulatory Visit: Payer: Medicaid Other

## 2019-01-05 LAB — AFP, SERUM, OPEN SPINA BIFIDA
AFP MoM: 1.06
AFP Value: 36.5 ng/mL
Gest. Age on Collection Date: 19.1 weeks
Maternal Age At EDD: 34.1 yr
OSBR Risk 1 IN: 10000
Test Results:: NEGATIVE
Weight: 356 [lb_av]

## 2019-01-07 ENCOUNTER — Encounter: Payer: Self-pay | Admitting: Registered"

## 2019-01-07 ENCOUNTER — Other Ambulatory Visit: Payer: Self-pay

## 2019-01-07 ENCOUNTER — Encounter: Payer: Medicaid Other | Attending: Obstetrics and Gynecology | Admitting: Registered"

## 2019-01-07 DIAGNOSIS — O99810 Abnormal glucose complicating pregnancy: Secondary | ICD-10-CM

## 2019-01-07 NOTE — Progress Notes (Signed)

## 2019-01-17 ENCOUNTER — Other Ambulatory Visit: Payer: Self-pay

## 2019-01-17 ENCOUNTER — Inpatient Hospital Stay (HOSPITAL_COMMUNITY)
Admission: AD | Admit: 2019-01-17 | Discharge: 2019-01-17 | Disposition: A | Discharge status: Home / Self Care | Payer: Medicaid Other | Attending: Obstetrics & Gynecology | Admitting: Obstetrics & Gynecology

## 2019-01-17 ENCOUNTER — Telehealth: Payer: Self-pay

## 2019-01-17 DIAGNOSIS — O1412 Severe pre-eclampsia, second trimester: Secondary | ICD-10-CM | POA: Diagnosis not present

## 2019-01-17 DIAGNOSIS — Z3A21 21 weeks gestation of pregnancy: Secondary | ICD-10-CM | POA: Diagnosis not present

## 2019-01-17 DIAGNOSIS — O112 Pre-existing hypertension with pre-eclampsia, second trimester: Secondary | ICD-10-CM

## 2019-01-17 DIAGNOSIS — O10912 Unspecified pre-existing hypertension complicating pregnancy, second trimester: Secondary | ICD-10-CM

## 2019-01-17 DIAGNOSIS — O24419 Gestational diabetes mellitus in pregnancy, unspecified control: Secondary | ICD-10-CM

## 2019-01-17 DIAGNOSIS — Z98891 History of uterine scar from previous surgery: Secondary | ICD-10-CM | POA: Insufficient documentation

## 2019-01-17 DIAGNOSIS — O10919 Unspecified pre-existing hypertension complicating pregnancy, unspecified trimester: Secondary | ICD-10-CM

## 2019-01-17 DIAGNOSIS — O119 Pre-existing hypertension with pre-eclampsia, unspecified trimester: Secondary | ICD-10-CM

## 2019-01-17 DIAGNOSIS — R102 Pelvic and perineal pain: Secondary | ICD-10-CM | POA: Diagnosis not present

## 2019-01-17 DIAGNOSIS — O26892 Other specified pregnancy related conditions, second trimester: Secondary | ICD-10-CM | POA: Diagnosis present

## 2019-01-17 DIAGNOSIS — O99210 Obesity complicating pregnancy, unspecified trimester: Secondary | ICD-10-CM

## 2019-01-17 DIAGNOSIS — O0992 Supervision of high risk pregnancy, unspecified, second trimester: Secondary | ICD-10-CM | POA: Insufficient documentation

## 2019-01-17 DIAGNOSIS — O99212 Obesity complicating pregnancy, second trimester: Secondary | ICD-10-CM | POA: Diagnosis not present

## 2019-01-17 DIAGNOSIS — Z79899 Other long term (current) drug therapy: Secondary | ICD-10-CM | POA: Diagnosis not present

## 2019-01-17 DIAGNOSIS — O0990 Supervision of high risk pregnancy, unspecified, unspecified trimester: Secondary | ICD-10-CM

## 2019-01-17 LAB — COMPREHENSIVE METABOLIC PANEL
ALT: 14 U/L (ref 0–44)
ANION GAP: 9 (ref 5–15)
AST: 17 U/L (ref 15–41)
Albumin: 2.8 g/dL — ABNORMAL LOW (ref 3.5–5.0)
Alkaline Phosphatase: 65 U/L (ref 38–126)
BUN: 15 mg/dL (ref 6–20)
CO2: 24 mmol/L (ref 22–32)
Calcium: 9.2 mg/dL (ref 8.9–10.3)
Chloride: 101 mmol/L (ref 98–111)
Creatinine, Ser: 1.13 mg/dL — ABNORMAL HIGH (ref 0.44–1.00)
GFR calc Af Amer: >60 mL/min (ref >60)
GFR calc non Af Amer: >60 mL/min (ref >60)
Glucose, Bld: 99 mg/dL (ref 70–99)
Potassium: 3.5 mmol/L (ref 3.5–5.1)
Sodium: 134 mmol/L — ABNORMAL LOW (ref 135–145)
Total Bilirubin: 0.3 mg/dL (ref 0.3–1.2)
Total Protein: 6.9 g/dL (ref 6.5–8.1)

## 2019-01-17 LAB — URINALYSIS, ROUTINE W REFLEX MICROSCOPIC
Bilirubin Urine: NEGATIVE
Glucose, UA: NEGATIVE mg/dL
Hgb urine dipstick: NEGATIVE
KETONES UR: NEGATIVE mg/dL
Leukocytes,Ua: NEGATIVE
Nitrite: NEGATIVE
Protein, ur: 100 mg/dL — AB
Specific Gravity, Urine: 1.024 (ref 1.005–1.030)
pH: 6 (ref 5.0–8.0)

## 2019-01-17 LAB — PROTEIN / CREATININE RATIO, URINE
CREATININE, URINE: 170.44 mg/dL
Protein Creatinine Ratio: 0.46 mg/mg{Cre} — ABNORMAL HIGH (ref 0.00–0.15)
TOTAL PROTEIN, URINE: 78 mg/dL

## 2019-01-17 LAB — CBC
HCT: 36.2 % (ref 36.0–46.0)
Hemoglobin: 11.8 g/dL — ABNORMAL LOW (ref 12.0–15.0)
MCH: 27.6 pg (ref 26.0–34.0)
MCHC: 32.6 g/dL (ref 30.0–36.0)
MCV: 84.8 fL (ref 80.0–100.0)
PLATELETS: 336 10*3/uL (ref 150–400)
RBC: 4.27 MIL/uL (ref 3.87–5.11)
RDW: 14.6 % (ref 11.5–15.5)
WBC: 13.5 10*3/uL — ABNORMAL HIGH (ref 4.0–10.5)
nRBC: 0.1 % (ref 0.0–0.2)

## 2019-01-17 LAB — TROPONIN I: TROPONIN I: 0.03 ng/mL — AB (ref <0.03)

## 2019-01-17 MED ORDER — LABETALOL HCL 5 MG/ML IV SOLN
20.0000 mg | INTRAVENOUS | Status: DC | PRN
  Administered 2019-01-17: 20 mg via INTRAVENOUS
  Filled 2019-01-17: qty 4

## 2019-01-17 MED ORDER — LABETALOL HCL 100 MG PO TABS
600.0000 mg | ORAL_TABLET | Freq: Once | ORAL | Status: DC
  Filled 2019-01-17: qty 6

## 2019-01-17 MED ORDER — HYDRALAZINE HCL 20 MG/ML IJ SOLN
10.0000 mg | INTRAMUSCULAR | Status: DC | PRN
  Administered 2019-01-17: 10 mg via INTRAVENOUS
  Filled 2019-01-17: qty 1

## 2019-01-17 MED ORDER — LABETALOL HCL 5 MG/ML IV SOLN
80.0000 mg | INTRAVENOUS | Status: DC | PRN
  Administered 2019-01-17: 80 mg via INTRAVENOUS
  Filled 2019-01-17: qty 16

## 2019-01-17 MED ORDER — LABETALOL HCL 5 MG/ML IV SOLN
40.0000 mg | INTRAVENOUS | Status: DC | PRN
  Administered 2019-01-17: 40 mg via INTRAVENOUS
  Filled 2019-01-17: qty 8

## 2019-01-17 MED ORDER — NIFEDIPINE ER OSMOTIC RELEASE 60 MG PO TB24: 60.0000 mg | ORAL_TABLET | Freq: Two times a day (BID) | ORAL | 1 refills | Status: DC

## 2019-01-17 NOTE — Discharge Instructions (Signed)
.  Preeclampsia and Eclampsia    Preeclampsia is a serious condition that may develop during pregnancy. It is also called toxemia of pregnancy. This condition causes high blood pressure along with other symptoms, such as swelling and headaches. These symptoms may develop as the condition gets worse. Preeclampsia may occur at 20 weeks of pregnancy or later.  Diagnosing and treating preeclampsia early is very important. If not treated early, it can cause serious problems for you and your baby. One problem it can lead to is eclampsia. Eclampsia is a condition that causes muscle jerking or shaking (convulsions or seizures) and other serious problems for the mother. During pregnancy, delivering your baby may be the best treatment for preeclampsia or eclampsia. For most women, preeclampsia and eclampsia symptoms go away after giving birth.  In rare cases, a woman may develop preeclampsia after giving birth (postpartum preeclampsia). This usually occurs within 48 hours after childbirth but may occur up to 6 weeks after giving birth.  What are the causes?  The cause of preeclampsia is not known.  What increases the risk?  The following risk factors make you more likely to develop preeclampsia:   Being pregnant for the first time.   Having had preeclampsia during a past pregnancy.   Having a family history of preeclampsia.   Having high blood pressure.   Being pregnant with more than one baby.   Being 35 or older.   Being African-American.   Having kidney disease or diabetes.   Having medical conditions such as lupus or blood diseases.   Being very overweight (obese).  What are the signs or symptoms?  The earliest signs of preeclampsia are:   High blood pressure.   Increased protein in your urine. Your health care provider will check for this at every visit before you give birth (prenatal visit).  Other symptoms that may develop as the condition gets worse include:   Severe headaches.   Sudden weight  gain.   Swelling of the hands, face, legs, and feet.   Nausea and vomiting.   Vision problems, such as blurred or double vision.   Numbness in the face, arms, legs, and feet.   Urinating less than usual.   Dizziness.   Slurred speech.   Abdominal pain, especially upper abdominal pain.   Convulsions or seizures.  How is this diagnosed?  There are no screening tests for preeclampsia. Your health care provider will ask you about symptoms and check for signs of preeclampsia during your prenatal visits. You may also have tests that include:   Urine tests.   Blood tests.   Checking your blood pressure.   Monitoring your baby's heart rate.   Ultrasound.  How is this treated?  You and your health care provider will determine the treatment approach that is best for you. Treatment may include:   Having more frequent prenatal exams to check for signs of preeclampsia, if you have an increased risk for preeclampsia.   Medicine to lower your blood pressure.   Staying in the hospital, if your condition is severe. There, treatment will focus on controlling your blood pressure and the amount of fluids in your body (fluid retention).   Taking medicine (magnesium sulfate) to prevent seizures. This may be given as an injection or through an IV.   Taking a low-dose aspirin during your pregnancy.   Delivering your baby early, if your condition gets worse. You may have your labor started with medicine (induced), or you may have a cesarean   delivery.  Follow these instructions at home:  Eating and drinking     Drink enough fluid to keep your urine pale yellow.   Avoid caffeine.  Lifestyle   Do not use any products that contain nicotine or tobacco, such as cigarettes and e-cigarettes. If you need help quitting, ask your health care provider.   Do not use alcohol or drugs.   Avoid stress as much as possible. Rest and get plenty of sleep.  General instructions   Take over-the-counter and prescription medicines only as  told by your health care provider.   When lying down, lie on your left side. This keeps pressure off your major blood vessels.   When sitting or lying down, raise (elevate) your feet. Try putting some pillows underneath your lower legs.   Exercise regularly. Ask your health care provider what kinds of exercise are best for you.   Keep all follow-up and prenatal visits as told by your health care provider. This is important.  How is this prevented?  There is no known way of preventing preeclampsia or eclampsia from developing. However, to lower your risk of complications and detect problems early:   Get regular prenatal care. Your health care provider may be able to diagnose and treat the condition early.   Maintain a healthy weight. Ask your health care provider for help managing weight gain during pregnancy.   Work with your health care provider to manage any long-term (chronic) health conditions you have, such as diabetes or kidney problems.   You may have tests of your blood pressure and kidney function after giving birth.   Your health care provider may have you take low-dose aspirin during your next pregnancy.  Contact a health care provider if:   You have symptoms that your health care provider told you may require more treatment or monitoring, such as:  ? Headaches.  ? Nausea or vomiting.  ? Abdominal pain.  ? Dizziness.  ? Light-headedness.  Get help right away if:   You have severe:  ? Abdominal pain.  ? Headaches that do not get better.  ? Dizziness.  ? Vision problems.  ? Confusion.  ? Nausea or vomiting.   You have any of the following:  ? A seizure.  ? Sudden, rapid weight gain.  ? Sudden swelling in your hands, ankles, or face.  ? Trouble moving any part of your body.  ? Numbness in any part of your body.  ? Trouble speaking.  ? Abnormal bleeding.   You faint.  Summary   Preeclampsia is a serious condition that may develop during pregnancy. It is also called toxemia of pregnancy.   This  condition causes high blood pressure along with other symptoms, such as swelling and headaches.   Diagnosing and treating preeclampsia early is very important. If not treated early, it can cause serious problems for you and your baby.   Get help right away if you have symptoms that your health care provider told you to watch for.  This information is not intended to replace advice given to you by your health care provider. Make sure you discuss any questions you have with your health care provider.  Document Released: 10/03/2000 Document Revised: 09/22/2017 Document Reviewed: 05/12/2016  Elsevier Interactive Patient Education  2019 [employer]

## 2019-01-17 NOTE — Telephone Encounter (Signed)
Patient called and states she has been having sharp abdominal pain for the last 3 days. Pt state she is also having headaches frequently, denies blurry vision. Pt verbalizes she has felt fetal movement, denies any bleeding. Pt reports she has been taking her blood pressure medication regularly, Aldomet TID. I advised pt that based on her chronic HTN and GDM diagnoses that she should go to MAU to be evaluated ASAP. Pt verbalizes understanding and says she will go.

## 2019-01-17 NOTE — [facility] Note (Addendum)
Pt reports pelvic pain x 3 days, better than it was over the weekend. 3/10.  HA is ongoing with her CHTN.

## 2019-01-17 NOTE — MAU Provider Note (Signed)
History     CSN: [id number]  Arrival date and time: 01/17/19 1609   First Provider Initiated Contact with Patient 01/17/19 1722      Chief Complaint  Patient presents with   Pelvic Pain   [name] is a 33 y.o. G2P1001 at 53w1dwho presents to [facility]U for preeclampsia evaluation [facility] and [facility] come [facility] for [facility]. Pt reports she called the clinic because of pelvic pain and was told she should be evaluated because of her co-existing pregnancy conditions.  Pt reports on-going issues with HA. The last time she had a HA was this morning, but denies HA at this time. Pt took Fioricet for HA about 1000 today, which caused resolution of the HA within the hour.  Pt also reports "chest tightness" starting in January 2020, around the time she learned she was pregnant. Last episode of chest tightness was today at 0100. Pt reports the chest tightness occurs randomly and the pt cannot identify anything that makes it better or worse. Pt reports simultaneous heart palpitations, but denies concurrent N/V, dizziness, numbness. Pt denies hx of anxiety.  Pt reports hx of asthma.  Pt also reports swelling in left hand and leg that began at the end of February.  Pt denies blurry vision/seeing spots, N/V, epigastric pain, swelling in face and hands sudden weight gain. Pt has gained 4.5kg since 01/04/2019. Pt denies chest pain and SOB at this time.  Pt denies constipation, diarrhea, or urinary problems. Pt denies fever, chills, fatigue, sweating or changes in appetite. Pt denies dizziness, light-headedness, weakness.  Pt denies VB, ctx, LOF and reports good FM.  Pt also reports mid-line pelvic pain x2-3days. Pt reports pain was the most severe this morning, which caused her to call the clinic. Pt reports the pain is worse when standing for extended periods of time or lying flat and better when sitting straight up. Pt describes pain as a "really bad cramp." Pt rates  pain at its worst as 10/10, but reports it currently is 3-4/10. Pt has not tried anything at home for the pain other than positional changes.  Current pregnancy problems? cHTN, GDM Blood Type? O positive Allergies? NKDA Current medications? see list under medications tab [facility]  Pt reports taking anti-hypertensives TID, but reports she missed her 2pm dosing today.    OB History    Gravida  2   Para  1   Term  1   Preterm      AB      Living  1     SAB      TAB      Ectopic      Multiple      Live Births  1           Past Medical History:  Diagnosis Date   Asthma    Hypertension     Past Surgical History:  Procedure Laterality Date   CESAREAN SECTION      Family History  Problem Relation Age of Onset   Diabetes Mother    Hypertension Mother    Multiple sclerosis Mother    Asthma Father    Heart attack Maternal Grandfather 57    Social History   Tobacco Use   Smoking status: Never Smoker   Smokeless tobacco: Never Used  Substance Use Topics   Alcohol use: Not Currently   Drug use: Not Currently    Allergies: No  Known Allergies  Medications Prior to Admission  Medication Sig Dispense Refill Last Dose   Butalbital-APAP-Caffeine 50-325-40 MG capsule Take 1-2 capsules by mouth every 6 (six) hours as needed for headache. 30 capsule 3 01/17/2019 at Unknown time   methyldopa (ALDOMET) 250 MG tablet Take 1 tablet (250 mg total) by mouth 3 (three) times daily. 90 tablet 3 01/17/2019 at Unknown time   metroNIDAZOLE (FLAGYL) 500 MG tablet Take 1 tablet (500 mg total) by mouth 2 (two) times daily. 14 tablet 0 Past Week at Unknown time   Prenatal Vit-Fe Fumarate-FA (PRENATAL VITAMIN) 27-0.8 MG TABS Take 1 tablet by mouth daily. 30 tablet 0 01/17/2019 at Unknown time   terconazole (TERAZOL 3) 0.8 % vaginal cream Place 1 applicator vaginally at bedtime. Apply nightly for three nights. 20 g 0 Past Week at  Unknown time   Accu-Chek FastClix Lancets MISC 1 Units by Percutaneous route 4 (four) times daily. 100 each 12 Taking   aspirin EC 81 MG tablet Take 1 tablet (81 mg total) by mouth daily. Take after 12 weeks for prevention of preeclampsia later in pregnancy (Patient not taking: Reported on 01/03/2019) 300 tablet 2 Not Taking   Blood Glucose Monitoring Suppl (ACCU-CHEK NANO SMARTVIEW) w/Device KIT 1 kit by Subdermal route as directed. Check blood sugars for fasting, and two hours after breakfast, lunch and dinner (4 checks daily) 1 kit 0 Taking   Butalbital-APAP-Caffeine 50-325-40 MG capsule Take 1-2 capsules by mouth every 6 (six) hours as needed for headache. 30 capsule 3 Taking   cloNIDine (CATAPRES) 0.2 MG tablet Take 1 tablet (0.2 mg total) by mouth 3 (three) times daily for 30 days. 90 tablet 0 Taking   glucose blood (ACCU-CHEK SMARTVIEW) test strip Use as instructed to check blood sugars 100 each 12 Taking   hydrALAZINE (APRESOLINE) 100 MG tablet Take 1 tablet (100 mg total) by mouth every 8 (eight) hours for 30 days. 90 tablet 0 Taking   labetalol (NORMODYNE) 300 MG tablet Take 1 tablet (300 mg total) by mouth 3 (three) times daily for 30 days. 90 tablet 0 Taking   NIFEdipine (PROCARDIA) 20 MG capsule Take 1 capsule (20 mg total) by mouth 3 (three) times daily for 30 days. 90 capsule 0 Taking    Review of Systems  Constitutional: Negative for chills, diaphoresis, fatigue and fever.  Respiratory: Positive for chest tightness (sx not present at MAU visit). Negative for shortness of breath.   Cardiovascular: Positive for palpitations (sx not present at MAU visit). Negative for chest pain.  Gastrointestinal: Negative for abdominal pain, constipation, diarrhea, nausea and vomiting.  Genitourinary: Positive for pelvic pain. Negative for difficulty urinating, dysuria, flank pain, frequency, urgency, vaginal bleeding and vaginal discharge.  Neurological: Negative for dizziness, weakness,  light-headedness, numbness and headaches.   Physical Exam   Blood pressure (!) 164/99, pulse 94, temperature 98.4 F (36.9 C), temperature source Oral, resp. rate 18, weight (!) 164.8 kg, last menstrual period 08/31/2018, SpO2 94 %.  Patient Vitals for the past 24 hrs:  BP Temp Temp src Pulse Resp SpO2 Weight  01/17/19 2031 (!) 164/99 -- -- 94 -- -- --  01/17/19 2016 (!) 153/81 -- -- 93 -- -- --  01/17/19 2007 (!) 154/80 -- -- 93 -- -- --  01/17/19 1946 (!) 148/82 -- -- 93 -- -- --  01/17/19 1931 (!) 151/88 -- -- 93 -- -- --  01/17/19 1916 (!) 156/87 -- -- 91 -- -- --  01/17/19 1900 (!) 156/97 -- --  91 -- 94 % --  01/17/19 1845 (!) 161/99 -- -- 90 -- 94 % --  01/17/19 1831 (!) 154/84 -- -- 92 -- 96 % --  01/17/19 1815 (!) 174/105 -- -- 93 -- 95 % --  01/17/19 1800 (!) 182/122 -- -- 96 -- 96 % --  01/17/19 1745 (!) 198/124 -- -- (!) 101 -- 98 % --  01/17/19 1730 (!) 227/135 -- -- (!) 114 -- 100 % --  01/17/19 1717 (!) 179/122 98.4 F (36.9 C) Oral (!) 107 18 97 % --  01/17/19 1656 -- -- -- -- -- -- (!) 164.8 kg  01/17/19 1655 -- -- -- -- -- -- (!) 164.8 kg    Physical Exam  Constitutional: She is oriented to person, place, and time. She appears well-developed and well-nourished. No distress.  HENT:  Head: Normocephalic and atraumatic.  Respiratory: Effort normal.  GI: She exhibits no distension and no mass. There is abdominal tenderness (see graphical documentation for more information).    Musculoskeletal:     Comments: No swelling of extremities noted on exam.  Neurological: She is alert and oriented to person, place, and time.  Skin: Skin is warm and dry. She is not diaphoretic.  Psychiatric: She has a normal mood and affect. Her behavior is normal.   Orders Placed This Encounter  Procedures   Urinalysis, Routine w reflex microscopic    Standing Status:   Standing    Number of Occurrences:   1   Protein / creatinine ratio, urine    Standing Status:   Standing     Number of Occurrences:   1   CBC    Standing Status:   Standing    Number of Occurrences:   1   Comprehensive metabolic panel    Standing Status:   Standing    Number of Occurrences:   1   Troponin I - ONCE - STAT    Standing Status:   Standing    Number of Occurrences:   1   Notify Physician    Confirmatory reading of BP> 160/110 15 minutes later    Standing Status:   Standing    Number of Occurrences:   1    Order Specific Question:   Notify Physician    Answer:   Temp greater than or equal to 100.4    Order Specific Question:   Notify Physician    Answer:   RR greater than 24 or less than 10    Order Specific Question:   Notify Physician    Answer:   HR greater than 120 or less than 50    Order Specific Question:   Notify Physician    Answer:   SBP greater than 160 mmHG or less than 80 mmHG    Order Specific Question:   Notify Physician    Answer:   DBP greater than 110 mmHG or less than 45 mmHG    Order Specific Question:   Notify Physician    Answer:   Urinary output is less than 116m for any 4 hour period   Vital signs    Standing Status:   Standing    Number of Occurrences:   1   Measure blood pressure    20 minutes after giving hydralazine 10 MG IV dose.  Call MD if SBP >/= 160 or DBP >/= 110.    Standing Status:   Standing    Number of Occurrences:   1   EKG 12-Lead  Standing Status:   Standing    Number of Occurrences:   1    Order Specific Question:   Reason for Exam    Answer:   chest tightness   Discharge patient    Order Specific Question:   Discharge disposition    Answer:   01-Home or Self Care [1]    Order Specific Question:   Discharge patient date    Answer:   01/17/2019   Meds ordered this encounter  Medications   AND Linked Order Group    labetalol (NORMODYNE,TRANDATE) injection 20 mg    labetalol (NORMODYNE,TRANDATE) injection 40 mg    labetalol (NORMODYNE,TRANDATE) injection 80 mg    hydrALAZINE (APRESOLINE) injection 10 mg    DISCONTD: labetalol (NORMODYNE) tablet 600 mg   NIFEdipine (PROCARDIA XL) 60 MG 24 hr tablet    Sig: Take 1 tablet (60 mg total) by mouth 2 (two) times daily.    Dispense:  60 tablet    Refill:  1    Order Specific Question:   Supervising Provider    Answer:   Woodroe Mode [[id number]]    MAU Course  Procedures  MDM -r/o preeclampsia           -preeclampsia protocol initiated with two severe-range pressures on admission           -BP lowered after 20/40/32m of labetalol administered, lowered to 154/84, hydralazine held, another severe range pressure of 161/99 shortly thereafter, hydralazine given, pressures below severe range since           -UA: 100 PRO, rare bacteria, hazy, otherwise WNL           -Pro/Creat: 0.46 (was 131 on 12/06/2018)           -CBC: WNL for pregnancy (platelets 336)           -CMP: serum creatinine 1.13 (was 1.33 on 12/06/2018)           -cHTN with superimposed preeclampsia with severe features -chest tightness/palpitations           -EKG: NSR, septal infarct, abnormal ECG -consulted with Dr. PKennon Roundsre: pt, reviewed EKG tracing with Dr. PKennon Rounds per Dr. PKennon Roundsrun a Troponin lab and if troponin normal and BP not severe after IV medications, can discharge to home and change procardia to Procardia XL 647mBID, other meds remaining the same; pt reports she was taken off labetalol 30041mID d/t side effects and she is no longer taking this medication           -Troponin: 0.03, unchanged since 11/26/2018, per Dr. PraKennon RoundsK [place] discharge with this lab value -pelvic/abdominal pain/area of induration           -CE: long/thick/posterior           -pt reports pelvic pain has improved during stay at MAU -swelling in left hand/leg - not noted on exam -pt to clinic Friday AM for BP check -discharged to home, confirmed discharge acceptable plan of action with Dr. PraKennon Roundsmediately before discharge -while in the patient's room providing discharge instructions, a severe range pressure  of 164/99 occurred. Conferred with Dr. PraKennon Roundst still OK for discharge.  Orders Placed This Encounter  Procedures   Urinalysis, Routine w reflex microscopic    Standing Status:   Standing    Number of Occurrences:   1   Protein / creatinine ratio, urine    Standing Status:   Standing    Number of Occurrences:   1  CBC    Standing Status:   Standing    Number of Occurrences:   1   Comprehensive metabolic panel    Standing Status:   Standing    Number of Occurrences:   1   Troponin I - ONCE - STAT    Standing Status:   Standing    Number of Occurrences:   1   Notify Physician    Confirmatory reading of BP> 160/110 15 minutes later    Standing Status:   Standing    Number of Occurrences:   1    Order Specific Question:   Notify Physician    Answer:   Temp greater than or equal to 100.4    Order Specific Question:   Notify Physician    Answer:   RR greater than 24 or less than 10    Order Specific Question:   Notify Physician    Answer:   HR greater than 120 or less than 50    Order Specific Question:   Notify Physician    Answer:   SBP greater than 160 mmHG or less than 80 mmHG    Order Specific Question:   Notify Physician    Answer:   DBP greater than 110 mmHG or less than 45 mmHG    Order Specific Question:   Notify Physician    Answer:   Urinary output is less than 12m for any 4 hour period   Vital signs    Standing Status:   Standing    Number of Occurrences:   1   Measure blood pressure    20 minutes after giving hydralazine 10 MG IV dose.  Call MD if SBP >/= 160 or DBP >/= 110.    Standing Status:   Standing    Number of Occurrences:   1   EKG 12-Lead    Standing Status:   Standing    Number of Occurrences:   1    Order Specific Question:   Reason for Exam    Answer:   chest tightness   Discharge patient    Order Specific Question:   Discharge disposition    Answer:   01-Home or Self Care [1]    Order Specific Question:   Discharge patient date     Answer:   01/17/2019    [place] Ob Detail +14 Wk  Result Date: 01/04/2019 ----------------------------------------------------------------------  OBSTETRICS REPORT                       (Signed Final 01/04/2019 10:59 am) ---------------------------------------------------------------------- Patient Info  ID #:       [id number]                         D.O.B.:  [birth year](32 yrs)  Name:       Danielle Kent                     Visit Date: 01/04/2019 09:55 am ---------------------------------------------------------------------- Performed By  Performed By:     HNovella Rob       Ref. Address:     Faculty                    RDMS  Attending:        RTama HighMD        Location[facility]  Referred By:      Vickii Chafe                    CONSTANT MD ---------------------------------------------------------------------- Orders   #  Description                          Code         Ordered By   1  [place] MFM OB DETAIL +14 [place]              76811.01     PEGGY CONSTANT  ----------------------------------------------------------------------   #  Order #                    Accession #                 Episode #   1  [id number]                  [id number]                  [id number]  ---------------------------------------------------------------------- Indications   Encounter for antenatal screening for          Z36.3   malformations (low risk [place])   Hypertension - Chronic/Pre-existing            O10.019   History of cesarean delivery, currently        O71.219   pregnant   Maternal morbid obesity (67.77)                O99.210 E66.01   Gestational diabetes in pregnancy, diet        O24.410   controlled   Uterine fibroids affecting pregnancy in        O34.12, D25.9   second trimester, antepartum   [redacted] weeks gestation of pregnancy                Z3A.19  ---------------------------------------------------------------------- Vital Signs  Weight (lb):  356                               Height:        5'0"  BMI:         69.52 ---------------------------------------------------------------------- Fetal Evaluation  Num Of Fetuses:         1  Fetal Heart Rate(bpm):  143  Cardiac Activity:       Observed  Presentation:           Cephalic  Placenta:               Anterior  P. Cord Insertion:      Visualized, central  Amniotic Fluid  AFI FV:      Within normal limits                              Largest Pocket(cm)                              4.33 ---------------------------------------------------------------------- Biometry  BPD:      42.8  mm     G. Age:  19w 0d         36  %    CI:         71.1   %    70 - 86  FL/HC:      19.4   %    16.1 - 18.3  HC:      161.7  mm     G. Age:  19w 0d         28  %    HC/AC:      1.11        1.09 - 1.39  AC:      145.8  mm     G. Age:  19w 6d         65  %    FL/BPD:     73.4   %  FL:       31.4  mm     G. Age:  19w 5d         60  %    FL/AC:      21.5   %    20 - 24  LV:        6.8  mm  Est. FW:     308  gm    0 lb 11 oz      53  % ---------------------------------------------------------------------- OB History  Gravidity:    2         Term:   1  Living:       1 ---------------------------------------------------------------------- Gestational Age  U/S Today:     19w 3d                                        EDD:   05/28/19  Best:          19w 2d     Det. By:  Previous Ultrasound      EDD:   05/29/19 ---------------------------------------------------------------------- Anatomy  Cranium:               Appears normal         Aortic Arch:            Not well visualized  Cavum:                 Appears normal         Ductal Arch:            Not well visualized  Ventricles:            Appears normal         Diaphragm:              Appears normal  Choroid Plexus:        Appears normal         Stomach:                Appears normal, left                                                                         sided  Cerebellum:            Not well visualized    Abdomen:                Appears normal  Posterior Fossa:       Not well visualized  Abdominal Wall:         Previously seen  Nuchal Fold:           Not well visualized    Cord Vessels:           Previously seen  Face:                  Not well visualized    Kidneys:                Previously seen  Lips:                  Not well visualized    Bladder:                Appears normal  Thoracic:              Appears normal         Spine:                  Not well visualized  Heart:                 Not well visualized    Upper Extremities:      Visualized  RVOT:                  Appears normal         Lower Extremities:      Appears normal  LVOT:                  Appears normal  Other:  Female gender Technically difficult due to maternal habitus and fetal          position. ---------------------------------------------------------------------- Cervix Uterus Adnexa  Cervix  Length:           4.03  cm.  Normal appearance by transabdominal scan.  Uterus  Single fibroid noted, see table below.  Left Ovary  Not visualized. No adnexal mass visualized.  Right Ovary  Within normal limits.  Cul De Sac  No free fluid seen.  Adnexa  No abnormality visualized. ---------------------------------------------------------------------- Myomas   Site                     L(cm)      W(cm)      D(cm)      Location   Anterior                 2.3        1.3        1.9  ----------------------------------------------------------------------   Blood Flow                 RI        PI       Comments  ---------------------------------------------------------------------- Impression  We performed fetal anatomy scan. No makers of  aneuploidies or fetal structural defects are seen. Fetal  biometry is consistent with her previously-established dates.  Amniotic fluid is normal and good fetal activity is seen. Fetal  anatomical survey is extremely-limited because of maternal   obesity.  A small anterior intramural myoma is seen.  On cell-free fetal DNA screening, the risks of fetal  aneuploidies are not increased.  Maternal obesity imposes limitations on the resolution of  images, and failure to detect fetal anomalies is more common  in obese pregnant women. As maternal obesity makes  clinical assessment of fetal growth difficult, we recommend  serial growth scans until delivery.  Her blood pressures  at our office were: 155/112 and 162/107  mm Hg. Patient has been having headaches off and on and  takes fioricet prn. She does not have visual disturbances.  She takes nifedipine 20 mg tid and methyldopa 250 mg tid. I  strongly recommended inpatient management to control her  blood pressure and determine the correct regimen and  dosage of antihypertensives. Patient refused admission. She  has an appointment tomorrow with your diabetic educator  that affords her an opportunity to have her blood pressure  checked.  She should be counseled if her blood pressures are still very  high (SBP over 160 or DBP over 110 mm Hg). ---------------------------------------------------------------------- Recommendations  -An appointment was made for her to return in 4 weeks for  completion of fetal anatomy. ----------------------------------------------------------------------                  Tama High, MD Electronically Signed Final Report   01/04/2019 10:59 am ----------------------------------------------------------------------    Assessment and Plan   1. Preeclampsia, severe, second trimester   2. Gestational diabetes mellitus (GDM) affecting pregnancy, antepartum   3. Previous cesarean section   4. Chronic hypertension during pregnancy, antepartum   5. Maternal morbid obesity, antepartum ([place])   6. Supervision of high risk pregnancy, antepartum   7. Chronic hypertension with superimposed preeclampsia   8. [redacted] weeks gestation of pregnancy    Allergies as of 01/17/2019   No Known  Allergies     Medication List    STOP taking these medications   labetalol 300 MG tablet Commonly known as:  NORMODYNE   NIFEdipine 20 MG capsule Commonly known as:  PROCARDIA Replaced by:  NIFEdipine 60 MG 24 hr tablet     TAKE these medications   Accu-Chek FastClix Lancets Misc 1 Units by Percutaneous route 4 (four) times daily.   Accu-Chek Nano SmartView w/Device Kit 1 kit by Subdermal route as directed. Check blood sugars for fasting, and two hours after breakfast, lunch and dinner (4 checks daily)   aspirin EC 81 MG tablet Take 1 tablet (81 mg total) by mouth daily. Take after 12 weeks for prevention of preeclampsia later in pregnancy   Butalbital-APAP-Caffeine 50-325-40 MG capsule Take 1-2 capsules by mouth every 6 (six) hours as needed for headache. What changed:  Another medication with the same name was removed. Continue taking this medication, and follow the directions you see here.   cloNIDine 0.2 MG tablet Commonly known as:  CATAPRES Take 1 tablet (0.2 mg total) by mouth 3 (three) times daily for 30 days.   glucose blood test strip Commonly known as:  Accu-Chek SmartView Use as instructed to check blood sugars   hydrALAZINE 100 MG tablet Commonly known as:  APRESOLINE Take 1 tablet (100 mg total) by mouth every 8 (eight) hours for 30 days.   methyldopa 250 MG tablet Commonly known as:  ALDOMET Take 1 tablet (250 mg total) by mouth 3 (three) times daily.   metroNIDAZOLE 500 MG tablet Commonly known as:  Flagyl Take 1 tablet (500 mg total) by mouth 2 (two) times daily.   NIFEdipine 60 MG 24 hr tablet Commonly known as:  Procardia XL Take 1 tablet (60 mg total) by mouth 2 (two) times daily. Replaces:  NIFEdipine 20 MG capsule   Prenatal Vitamin 27-0.8 MG Tabs Take 1 tablet by mouth daily.   terconazole 0.8 % vaginal cream Commonly known as:  TERAZOL 3 Place 1 applicator vaginally at bedtime. Apply nightly for three nights.       -  please let  your OB know about your chest tightness and palpitations so you can be referred to cardiology, if appropriate -you have been sent a new prescription for Procardia XL 19m BID, please use this instead of your old RX for procardia -please proceed to the clinic Friday AM for a BP check, clinic has been notified by inbasket message via Epic -preeclampsia/PTL/MAU return precautions given -pt discharged to home in stable condition  NElmyra RicksE Naseem Kent 01/17/2019, 8:45 PM

## 2019-01-19 ENCOUNTER — Telehealth: Payer: Self-pay | Admitting: Certified Nurse Midwife

## 2019-01-19 MED ORDER — NIFEDIPINE ER OSMOTIC RELEASE 60 MG PO TB24: 60.0000 mg | ORAL_TABLET | Freq: Two times a day (BID) | ORAL | 1 refills | Status: DC

## 2019-01-19 NOTE — Telephone Encounter (Signed)
Pt requesting Procardia Rx be sent to another pharmacy. Rx sent to [facility] as pt requested.

## 2019-01-21 ENCOUNTER — Other Ambulatory Visit: Payer: Self-pay

## 2019-01-21 ENCOUNTER — Inpatient Hospital Stay (HOSPITAL_COMMUNITY)
Admission: AD | Admit: 2019-01-21 | Discharge: 2019-01-21 | Disposition: A | Discharge status: Home / Self Care | Payer: Medicaid Other | Attending: Obstetrics & Gynecology | Admitting: Obstetrics & Gynecology

## 2019-01-21 ENCOUNTER — Ambulatory Visit: Payer: Medicaid Other

## 2019-01-21 ENCOUNTER — Encounter (HOSPITAL_COMMUNITY): Payer: Self-pay

## 2019-01-21 VITALS — BP 205/142 | HR 111 | Wt 361.6 lb

## 2019-01-21 DIAGNOSIS — O0990 Supervision of high risk pregnancy, unspecified, unspecified trimester: Secondary | ICD-10-CM

## 2019-01-21 DIAGNOSIS — O162 Unspecified maternal hypertension, second trimester: Secondary | ICD-10-CM | POA: Insufficient documentation

## 2019-01-21 DIAGNOSIS — Z7982 Long term (current) use of aspirin: Secondary | ICD-10-CM | POA: Insufficient documentation

## 2019-01-21 DIAGNOSIS — O0992 Supervision of high risk pregnancy, unspecified, second trimester: Secondary | ICD-10-CM

## 2019-01-21 DIAGNOSIS — Z0130 Encounter for examination of blood pressure without abnormal findings: Secondary | ICD-10-CM

## 2019-01-21 DIAGNOSIS — O24419 Gestational diabetes mellitus in pregnancy, unspecified control: Secondary | ICD-10-CM | POA: Diagnosis not present

## 2019-01-21 DIAGNOSIS — O99212 Obesity complicating pregnancy, second trimester: Secondary | ICD-10-CM | POA: Insufficient documentation

## 2019-01-21 DIAGNOSIS — J45909 Unspecified asthma, uncomplicated: Secondary | ICD-10-CM | POA: Diagnosis not present

## 2019-01-21 DIAGNOSIS — O34219 Maternal care for unspecified type scar from previous cesarean delivery: Secondary | ICD-10-CM | POA: Diagnosis not present

## 2019-01-21 DIAGNOSIS — O99512 Diseases of the respiratory system complicating pregnancy, second trimester: Secondary | ICD-10-CM | POA: Insufficient documentation

## 2019-01-21 DIAGNOSIS — Z3A21 21 weeks gestation of pregnancy: Secondary | ICD-10-CM | POA: Insufficient documentation

## 2019-01-21 DIAGNOSIS — O10912 Unspecified pre-existing hypertension complicating pregnancy, second trimester: Secondary | ICD-10-CM

## 2019-01-21 DIAGNOSIS — Z98891 History of uterine scar from previous surgery: Secondary | ICD-10-CM

## 2019-01-21 DIAGNOSIS — Z79899 Other long term (current) drug therapy: Secondary | ICD-10-CM | POA: Diagnosis not present

## 2019-01-21 DIAGNOSIS — O99210 Obesity complicating pregnancy, unspecified trimester: Secondary | ICD-10-CM

## 2019-01-21 DIAGNOSIS — O10919 Unspecified pre-existing hypertension complicating pregnancy, unspecified trimester: Secondary | ICD-10-CM

## 2019-01-21 LAB — COMPREHENSIVE METABOLIC PANEL
ALT: 15 U/L (ref 0–44)
AST: 19 U/L (ref 15–41)
Albumin: 2.9 g/dL — ABNORMAL LOW (ref 3.5–5.0)
Alkaline Phosphatase: 76 U/L (ref 38–126)
Anion gap: 7 (ref 5–15)
BUN: 10 mg/dL (ref 6–20)
CO2: 26 mmol/L (ref 22–32)
Calcium: 9 mg/dL (ref 8.9–10.3)
Chloride: 102 mmol/L (ref 98–111)
Creatinine, Ser: 0.98 mg/dL (ref 0.44–1.00)
GFR calc Af Amer: >60 mL/min (ref >60)
GFR calc non Af Amer: >60 mL/min (ref >60)
Glucose, Bld: 93 mg/dL (ref 70–99)
Potassium: 3.8 mmol/L (ref 3.5–5.1)
Sodium: 135 mmol/L (ref 135–145)
Total Bilirubin: 0.2 mg/dL — ABNORMAL LOW (ref 0.3–1.2)
Total Protein: 6.6 g/dL (ref 6.5–8.1)

## 2019-01-21 LAB — URINALYSIS, ROUTINE W REFLEX MICROSCOPIC
Bilirubin Urine: NEGATIVE
Glucose, UA: NEGATIVE mg/dL
Hgb urine dipstick: NEGATIVE
Ketones, ur: 5 mg/dL — AB
Leukocytes,Ua: NEGATIVE
Nitrite: NEGATIVE
Protein, ur: 100 mg/dL — AB
Specific Gravity, Urine: 1.027 (ref 1.005–1.030)
pH: 5 (ref 5.0–8.0)

## 2019-01-21 LAB — PROTEIN / CREATININE RATIO, URINE
Creatinine, Urine: 257.74 mg/dL
Protein Creatinine Ratio: 0.32 mg/mg{Cre} — ABNORMAL HIGH (ref 0.00–0.15)
Total Protein, Urine: 83 mg/dL

## 2019-01-21 LAB — CBC
HCT: 37.4 % (ref 36.0–46.0)
Hemoglobin: 12.1 g/dL (ref 12.0–15.0)
MCH: 27.3 pg (ref 26.0–34.0)
MCHC: 32.4 g/dL (ref 30.0–36.0)
MCV: 84.2 fL (ref 80.0–100.0)
Platelets: 344 10*3/uL (ref 150–400)
RBC: 4.44 MIL/uL (ref 3.87–5.11)
RDW: 14.6 % (ref 11.5–15.5)
WBC: 12.2 10*3/uL — ABNORMAL HIGH (ref 4.0–10.5)
nRBC: 0.2 % (ref 0.0–0.2)

## 2019-01-21 LAB — GLUCOSE, CAPILLARY: Glucose-Capillary: 89 mg/dL (ref 70–99)

## 2019-01-21 MED ORDER — LABETALOL HCL 5 MG/ML IV SOLN: 40.0000 mg | INTRAVENOUS | Status: DC | PRN

## 2019-01-21 MED ORDER — LABETALOL HCL 5 MG/ML IV SOLN
40.0000 mg | INTRAVENOUS | Status: DC | PRN
  Administered 2019-01-21: 16:00:00 40 mg via INTRAVENOUS
  Filled 2019-01-21: qty 8

## 2019-01-21 MED ORDER — HYDRALAZINE HCL 50 MG PO TABS
100.0000 mg | ORAL_TABLET | Freq: Three times a day (TID) | ORAL | Status: DC
  Administered 2019-01-21: 19:00:00 100 mg via ORAL
  Filled 2019-01-21: qty 2

## 2019-01-21 MED ORDER — LABETALOL HCL 200 MG PO TABS: 200.0000 mg | ORAL_TABLET | Freq: Three times a day (TID) | ORAL | 1 refills | Status: DC

## 2019-01-21 MED ORDER — LABETALOL HCL 100 MG PO TABS
200.0000 mg | ORAL_TABLET | Freq: Once | ORAL | Status: AC
  Administered 2019-01-21: 21:00:00 200 mg via ORAL
  Filled 2019-01-21: qty 2

## 2019-01-21 MED ORDER — NIFEDIPINE 10 MG PO CAPS
10.0000 mg | ORAL_CAPSULE | ORAL | Status: DC | PRN
  Filled 2019-01-21: qty 1

## 2019-01-21 MED ORDER — HYDRALAZINE HCL 20 MG/ML IJ SOLN
10.0000 mg | INTRAMUSCULAR | Status: DC | PRN
  Administered 2019-01-21: 10 mg via INTRAVENOUS
  Filled 2019-01-21: qty 1

## 2019-01-21 MED ORDER — LACTATED RINGERS IV SOLN
INTRAVENOUS | Status: DC
  Administered 2019-01-21: 15:00:00 via INTRAVENOUS

## 2019-01-21 MED ORDER — NIFEDIPINE ER OSMOTIC RELEASE 30 MG PO TB24: 60.0000 mg | ORAL_TABLET | Freq: Two times a day (BID) | ORAL | Status: DC

## 2019-01-21 MED ORDER — LABETALOL HCL 5 MG/ML IV SOLN
20.0000 mg | INTRAVENOUS | Status: DC | PRN
  Administered 2019-01-21: 20 mg via INTRAVENOUS
  Filled 2019-01-21: qty 4

## 2019-01-21 MED ORDER — NIFEDIPINE 10 MG PO CAPS
20.0000 mg | ORAL_CAPSULE | ORAL | Status: DC | PRN
  Filled 2019-01-21: qty 2

## 2019-01-21 MED ORDER — CLONIDINE HCL 0.2 MG PO TABS: 0.2000 mg | ORAL_TABLET | Freq: Three times a day (TID) | ORAL | 1 refills | Status: DC

## 2019-01-21 MED ORDER — LABETALOL HCL 5 MG/ML IV SOLN
80.0000 mg | INTRAVENOUS | Status: DC | PRN
  Administered 2019-01-21: 80 mg via INTRAVENOUS
  Filled 2019-01-21: qty 16

## 2019-01-21 MED ORDER — NIFEDIPINE 10 MG PO CAPS
20.0000 mg | ORAL_CAPSULE | ORAL | Status: DC | PRN
  Administered 2019-01-21: 18:00:00 20 mg via ORAL

## 2019-01-21 MED ORDER — BUTALBITAL-APAP-CAFFEINE 50-325-40 MG PO TABS
2.0000 | ORAL_TABLET | ORAL | Status: DC | PRN
  Administered 2019-01-21: 15:00:00 2 via ORAL
  Filled 2019-01-21: qty 2

## 2019-01-21 MED ORDER — CLONIDINE HCL ER 0.1 MG PO TB12
0.2000 mg | ORAL_TABLET | Freq: Two times a day (BID) | ORAL | Status: DC
  Administered 2019-01-21: 19:00:00 0.2 mg via ORAL
  Filled 2019-01-21: qty 2

## 2019-01-21 MED ORDER — HYDRALAZINE HCL 100 MG PO TABS: 100.0000 mg | ORAL_TABLET | Freq: Three times a day (TID) | ORAL | 1 refills | Status: DC

## 2019-01-21 MED ORDER — NIFEDIPINE ER OSMOTIC RELEASE 30 MG PO TB24: 90.0000 mg | ORAL_TABLET | Freq: Two times a day (BID) | ORAL | Status: DC

## 2019-01-21 NOTE — Discharge Instructions (Signed)
Medications to take daily: Nifedipine (procardia) 60mg  twice per day Clonidine 0.2mg  three times per day Hydralazine 100mg  three times per day Labetalol 200mg  three times per day    Social Distancing FAQ: How It Helps Prevent COVID-19 (Coronavirus) and Steps We Can Take to Protect Ourselves There are many things we can do to prevent the spread of COVID-19 (coronavirus): washing our hands, coughing into our elbows, avoiding touching our faces, staying home if we're feeling sick and social distancing. But what is social distancing? These frequently asked questions explain how to practice social distancing in order to protect yourself, your loved ones and your community. General Information About Social Distancing Q: What is social distancing? A: Social distancing is the practice of purposefully reducing close contact between people. According to the CDC, social distancing means:  Remaining out of congregate settings as much as possible.  Avoiding mass gatherings.  Maintaining distance of about 6 feet from others when possible. Q: Why is social distancing important? A: Social distancing is crucial for preventing the spread of contagious illnesses such as COVID-19 (coronavirus). COVID-19 can spread through coughing, sneezing and close contact. By minimizing the amount of close contact we have with others, we reduce our chances of catching the virus and spreading it to our loved ones and within our community. Q: Who is social distancing important for? A: Social distancing is important for all of [place], but those of [place] who are at higher risk of serious complications caused by [id number] should be especially cautious about social distancing. People who are at high risk of complications include:  Older adults.  People who have serious chronic medical conditions like heart disease, diabetes and lung disease. Q: What is flattening the curve? What does it have to do with social distancing? A:  Flattening the curve refers to reducing the number of people who are sick at one time. If there are high surges in the number of COVID-19 cases all at once, health care systems and resources could potentially become overwhelmed. Efforts that help stop COVID-19 from spreading rapidly - like social distancing - help keep the number of people who are sick at one time as low as possible. Q: When should I start practicing social distancing? A: The best time to begin social distancing is before an illness like COVID-19 becomes widespread throughout your community. Each community's situation is unique, so it is important to follow the guidance of local government, health departments and health care providers. Currently in [place], gatherings of more than 10 people are banned. For the latest information on ATFTD-32 policies in [place], visit the [place] website and view news releases and executive orders. The advice below is applicable if you are symptom-free and have reasonable confidence that you have not had exposure to COVID-19. Always follow the guidance of local government, health departments and your health care providers. Visiting Public Spaces Q: How can I practice social distancing in the workplace? A: When possible, keeping about 6 feet of distance between yourself and others is key. It's also important to practice other preventative measures such as washing hands, avoiding touching your face, coughing into your elbow and staying home if you feel sick. Depending on your job and your community's situation, working from home may be an option. Always follow local guidance. Q: How can my child practice social distancing at school or at college?  A: Many schools and universities in the [place]. have postponed in-person classes; currently in  [place], [employer] schools have been closed until May 15 (this is subject to change). It's important to follow  local guidance, as each community's situation is unique. It's important for people of all ages to follow preventative measures, including staying about 6 feet away from others, avoiding touching your face, washing your hands and coughing into your elbow. Q: Should I be concerned about going to the grocery store? A: In any place where large numbers of people gather, there is potential risk for disease transmission. When you visit the grocery store, keep about 6 feet between yourself and others and use prevention techniques like avoiding touching your face and washing your hands. If possible, visit the store at times when there are likely to be fewer people shopping. Q: Should I take public transportation? A: If you have the option, driving yourself, walking to work or working from home can help reduce the number of people who are using public transportation, which benefits you and your community. In any of these situations, it's very important to keep distance between yourself and others in addition to practicing other preventative measures. Q: Should I stop visiting restaurants and bars? A: Currently in [place], restaurants and bars have been closed until further notice. Always follow local guidance. Avoiding public places as much as possible helps prevent diseases from spreading. If dining out is a non-essential activity, then it is generally in the best interest of you and your loved ones to avoid it. Q: Can I still go to the gym? A: Currently in [place], gyms have been closed until further notice. Always follow local guidance. Alternatives could include exercising in your home or yard or walking through your neighborhood. If you do go to the gym, wipe down and sanitize your equipment, keep distance between yourself and others, avoid touching other people and practice other preventative techniques.  Q: What about events and places where many people gather, such as concerts, festivals,  sporting events and churches? A: Risk of disease transmission is much higher in large groups of people. Effective March 25 at 5 PM in [place], meetings of over 10 people are prohibited in order to prevent COVID-19 from spreading rapidly. Avoiding these gatherings are generally in the best interest of protecting yourself, your loved ones and your community. Always follow local guidance. Meeting with Others Q: Should I stop visiting my elderly relatives and friends?  A: Older adults are at high risk of serious complications from QJJHE-17. Limiting their exposure as much as possible to those who may be sick or who may be carrying the disease is crucial. This is a great opportunity to try other methods of connecting, such as over the phone or through a video chat. Q: What about social distancing with other people in my household? A: Avoiding close contact within a household is almost impossible, and social distancing is mainly focused on large groups. However, if someone in your household is sick, it's important to minimize close contact with them as much as is reasonable. Q: Should I stop meeting up with 1 or 2 friends? Should I stop dating? A: While meeting up with another person who also is symptom-free may be alright in some situations, keep in mind that risk of disease transmission is higher in public places. Now may be a good time to consider other methods of connecting with others, such as over the phone or through a video chat. Q: Can I have a small group of my extended family and/or friends  over to my house? A: If possible, it's best to postpone these kinds of gatherings and look for alternative ways to connect. Avoiding non-essential gatherings is important for preventing the spread of disease. Q: Should my family and friends cancel big gathering events like weddings and birthday parties? A: Effective March 25 at 5 PM in [place], meetings of over 10 people are prohibited in order to  prevent COVID-19 from spreading rapidly. Always follow local guidance. While it's difficult to postpone important events like these, it's also very important to protect our loved ones - especially our loved ones who are most vulnerable. It may be best to postpone or alter your plans.  Q: If I'm avoiding in-person gatherings, how can I stay connected to others? A: There are many ways you can connect with friends: phone calls, text messages, emails and video chats are all great virtual options. While physical social distancing is important for our health, so is social interaction - trying alternative ways to stay connected is a good way to take care of your emotional health. If You Are Experiencing Symptoms Q: How should I approach social distancing if I start to feel sick? A: If you begin to experience symptoms, it's important to stay home and distance yourself from others. Always follow the guidance of your health care providers and local government. Q: Can I have visitors while I am in quarantine? A: Having visitors should be avoided as much as possible. Quarantining is an important way to protect your loved ones and community from picking up the disease; visitors are at high risk of catching the illness from you. Q: Can I go outside in my yard if I am in quarantine? A: Depending on where you live and your community's situation, going outside and getting some fresh air in your yard may be safe. Always follow the guidance of your health care providers and local government.

## 2019-01-21 NOTE — MAU Provider Note (Addendum)
History      Patient Danielle Kent  Is a 33 y.o. G2P1001 [place]. She is a chronic hypertensive, recently diagnosed with super-imposed preeclampsia, although unclear if disease may be uncontrolled hypertension instead. Patient is also a gestational diabetic, and has a history of a prior c-section. She has a history of asthma; has not had an attack in many years.   Patient She had an admission in February for hypertensive crisis; upon discharged she was instructed to take the following:  clonidine 0.2 mg TID hydralazine 100 mg q 8  Labetalol 300 mg TID.  Nifedipine 20 mg TID   Patient was initially took the labetalol and catapres early in her pregnancy and then was switched to aldomet 250 TID on 3-16 because the labetalol made her too drowsy.  Per patient, she continued to take her hydralazine and nifedipine. She had been taking clonidine 0.2 mg but ran out last week.   Patient had MAU visit on 01-17-2019; at that visit she received IV push medicine to lower her pressures. Upon discharge she was instructed to continue taking aldomet; Procardia 60 XL BID was added. [facility].   At that appt, she was found to have BPs of 202/141 and 205/142. At that point she denied headache, blurry vision, floating spots or SOB. [facility] recommended that she come to MAU for evaluation.   CSN: [id number] Arrival date and time: 01/21/19 1318   First Provider Initiated Contact with Patient 01/21/19 1424      Chief Complaint  Patient presents with  . Hypertension   Hypertension  This is a chronic problem. The problem has been gradually worsening since onset. The problem is uncontrolled. Associated symptoms include chest pain. Pertinent negatives include no blurred vision or shortness of breath. Risk factors for coronary artery disease include obesity (pregnancy and gestational diabetes).    OB History    Gravida   2   Para  1   Term  1   Preterm      AB      Living  1     SAB      TAB      Ectopic      Multiple      Live Births  1           Past Medical History:  Diagnosis Date  . Asthma   . Hypertension     Past Surgical History:  Procedure Laterality Date  . CESAREAN SECTION      Family History  Problem Relation Age of Onset  . Diabetes Mother   . Hypertension Mother   . Multiple sclerosis Mother   . Asthma Father   . Heart attack Maternal Grandfather 57    Social History   Tobacco Use  . Smoking status: Never Smoker  . Smokeless tobacco: Never Used  Substance Use Topics  . Alcohol use: Not Currently  . Drug use: Not Currently    Allergies: No Known Allergies  Medications Prior to Admission  Medication Sig Dispense Refill Last Dose  . Accu-Chek FastClix Lancets MISC 1 Units by Percutaneous route 4 (four) times daily. 100 each 12 01/21/2019 at Unknown time  . Blood Glucose Monitoring Suppl (ACCU-CHEK NANO SMARTVIEW) w/Device KIT 1 kit by Subdermal route as directed. Check blood sugars for fasting, and two hours after breakfast, lunch and dinner (4 checks daily)  1 kit 0 01/21/2019 at Unknown time  . Butalbital-APAP-Caffeine 50-325-40 MG capsule Take 1-2 capsules by mouth every 6 (six) hours as needed for headache. 30 capsule 3 01/21/2019 at Unknown time  . glucose blood (ACCU-CHEK SMARTVIEW) test strip Use as instructed to check blood sugars 100 each 12 01/21/2019 at Unknown time  . methyldopa (ALDOMET) 250 MG tablet Take 1 tablet (250 mg total) by mouth 3 (three) times daily. 90 tablet 3 01/21/2019 at Unknown time  . metroNIDAZOLE (FLAGYL) 500 MG tablet Take 1 tablet (500 mg total) by mouth 2 (two) times daily. 14 tablet 0 01/21/2019 at Unknown time  . NIFEdipine (PROCARDIA XL) 60 MG 24 hr tablet Take 1 tablet (60 mg total) by mouth 2 (two) times daily. 60 tablet 1 01/21/2019 at 0930  . Prenatal Vit-Fe Fumarate-FA (PRENATAL VITAMIN) 27-0.8 MG TABS Take 1 tablet by mouth  daily. 30 tablet 0 01/21/2019 at Unknown time  . aspirin EC 81 MG tablet Take 1 tablet (81 mg total) by mouth daily. Take after 12 weeks for prevention of preeclampsia later in pregnancy (Patient not taking: Reported on 01/03/2019) 300 tablet 2 Not Taking  . cloNIDine (CATAPRES) 0.2 MG tablet Take 1 tablet (0.2 mg total) by mouth 3 (three) times daily for 30 days. 90 tablet 0 Taking  . hydrALAZINE (APRESOLINE) 100 MG tablet Take 1 tablet (100 mg total) by mouth every 8 (eight) hours for 30 days. 90 tablet 0 Taking  . NIFEdipine (PROCARDIA XL) 60 MG 24 hr tablet Take 1 tablet (60 mg total) by mouth 2 (two) times daily. (Patient not taking: Reported on 01/21/2019) 60 tablet 1 Not Taking  . terconazole (TERAZOL 3) 0.8 % vaginal cream Place 1 applicator vaginally at bedtime. Apply nightly for three nights. (Patient not taking: Reported on 01/21/2019) 20 g 0 Not Taking    Review of Systems  Constitutional: Negative.   HENT: Negative.   Eyes: Negative for blurred vision.  Respiratory: Negative for shortness of breath.   Cardiovascular: Positive for chest pain.  Gastrointestinal: Negative.   Genitourinary: Negative.   Musculoskeletal: Negative.   Neurological: Negative.   Psychiatric/Behavioral: Negative.    Physical Exam   Blood pressure (!) 159/89, pulse (!) 108, temperature 98.6 F (37 C), temperature source Oral, resp. rate 20, height 5' (1.524 m), weight (!) 163.7 kg, last menstrual period 08/31/2018, SpO2 97 %.  Physical Exam  Constitutional: She is oriented to person, place, and time. She appears well-developed.  HENT:  Head: Normocephalic.  Neck: Normal range of motion.  GI: Soft.  Musculoskeletal: Normal range of motion.  Neurological: She is alert and oriented to person, place, and time.  Skin: Skin is warm and dry.    MAU Course  Procedures  MDM -Will order pre-e labs, obtain IV access.  -Fioricet for headache and stat EKG. EKG normal -CMP, CBC are normal, UPC is 0.3.  FHR of  148 BGM was 89  Patient required three doses of Labetalol and 1 dose of IV hydralazine. Patient tolerated labetalol well but hydralazine made her sleepy.  Headache is now a 0/10 after Fioricet  1713: Patient's bp now elevated again after hydralazine x 1; will give procardia 20 mg orally.  1851: Patient to have 0.2 mg of clonidine now, and 100 mg of hydralazine now. Hold procardia 60 mg XL as patient has received 20 mg of nifedipine while in MAU.   Due to patient's high acuity, patient to remain in MAU for observation, disposition pending.  Care endorsed to  Wenzel at [employer].   8:31 PM Patient's blood pressure had become within an acceptable range, and then became 175/100 again. DW Dr. Harolyn Rutherford, will add 274m labetalol now, and recheck bp in 30 mins. If less than 160/90 at that time then patient can be DC home, and add 2079mlabetalol TID to her med list. [facility]  Assessment and Plan   1. Chronic hypertension affecting pregnancy   2. Gestational diabetes mellitus (GDM) affecting pregnancy, antepartum   3. Previous cesarean section   4. Chronic hypertension during pregnancy, antepartum   5. Maternal morbid obesity, antepartum ([place]  6. Supervision of high risk pregnancy, antepartum    DC home Take all medications as prescribed. Detailed instructions/list provided for the patient  2ndTrimester precautions  RX: procardia XL 6046mID, Clonidine 0.2md31mD, Hyralazine 100mg23m, labetalol 200mg 51mReturn to MAU as needed BP check in the office on Monday   [place] [facility].   Specialty:  Obstetrics and Gynecology Why:  For BP Check Contact information: [address] [place] 01/21/19  9:13 PM     KathryMervyn Skeeterstra 01/21/2019, 2:27 PM

## 2019-01-21 NOTE — Progress Notes (Signed)
Pt is in the office for BP check. BP 202/141 and 205/142. Pt denies any pain or abnormal symptoms. Pt states that she took Procardia and Aldomet today. Advised pt that per provider, she needs to go back to the hospital immediately. Pt agreed.

## 2019-01-21 NOTE — MAU Note (Signed)
Was having follow up visit at [facility] for her blood pressure, was started low dose Procardia and was started on Procardia XL on Wednesday.  Today, BP was still high - was 205 over something.  Occas low abd cramps.  No bleeding. Having headache just started in last hour.  Has felt baby flutters. No leaking fluid.

## 2019-02-01 ENCOUNTER — Other Ambulatory Visit: Payer: Self-pay

## 2019-02-01 ENCOUNTER — Ambulatory Visit (HOSPITAL_COMMUNITY): Payer: Medicaid Other | Admitting: *Deleted

## 2019-02-01 ENCOUNTER — Inpatient Hospital Stay (HOSPITAL_COMMUNITY)
Admission: AD | Admit: 2019-02-01 | Discharge: 2019-02-01 | Disposition: A | Discharge status: Home / Self Care | Payer: Medicaid Other | Attending: Family Medicine | Admitting: Family Medicine

## 2019-02-01 ENCOUNTER — Encounter: Payer: Medicaid Other | Admitting: Obstetrics and Gynecology

## 2019-02-01 ENCOUNTER — Ambulatory Visit (HOSPITAL_COMMUNITY)
Admission: RE | Admit: 2019-02-01 | Discharge: 2019-02-01 | Disposition: A | Discharge status: Home / Self Care | Payer: Medicaid Other | Source: Ambulatory Visit | Attending: Obstetrics and Gynecology | Admitting: Obstetrics and Gynecology

## 2019-02-01 ENCOUNTER — Encounter (HOSPITAL_COMMUNITY): Payer: Self-pay | Admitting: *Deleted

## 2019-02-01 ENCOUNTER — Encounter (HOSPITAL_COMMUNITY): Payer: Self-pay

## 2019-02-01 ENCOUNTER — Other Ambulatory Visit (HOSPITAL_COMMUNITY): Payer: Self-pay | Admitting: *Deleted

## 2019-02-01 DIAGNOSIS — Z363 Encounter for antenatal screening for malformations: Secondary | ICD-10-CM | POA: Diagnosis not present

## 2019-02-01 DIAGNOSIS — O99210 Obesity complicating pregnancy, unspecified trimester: Secondary | ICD-10-CM | POA: Insufficient documentation

## 2019-02-01 DIAGNOSIS — O3412 Maternal care for benign tumor of corpus uteri, second trimester: Secondary | ICD-10-CM

## 2019-02-01 DIAGNOSIS — O10912 Unspecified pre-existing hypertension complicating pregnancy, second trimester: Secondary | ICD-10-CM | POA: Insufficient documentation

## 2019-02-01 DIAGNOSIS — Z98891 History of uterine scar from previous surgery: Secondary | ICD-10-CM | POA: Insufficient documentation

## 2019-02-01 DIAGNOSIS — O10919 Unspecified pre-existing hypertension complicating pregnancy, unspecified trimester: Secondary | ICD-10-CM | POA: Insufficient documentation

## 2019-02-01 DIAGNOSIS — O0990 Supervision of high risk pregnancy, unspecified, unspecified trimester: Secondary | ICD-10-CM

## 2019-02-01 DIAGNOSIS — Z3A23 23 weeks gestation of pregnancy: Secondary | ICD-10-CM | POA: Insufficient documentation

## 2019-02-01 DIAGNOSIS — O10012 Pre-existing essential hypertension complicating pregnancy, second trimester: Secondary | ICD-10-CM

## 2019-02-01 DIAGNOSIS — O1202 Gestational edema, second trimester: Secondary | ICD-10-CM | POA: Insufficient documentation

## 2019-02-01 DIAGNOSIS — O99212 Obesity complicating pregnancy, second trimester: Secondary | ICD-10-CM | POA: Insufficient documentation

## 2019-02-01 DIAGNOSIS — O162 Unspecified maternal hypertension, second trimester: Secondary | ICD-10-CM | POA: Insufficient documentation

## 2019-02-01 DIAGNOSIS — O24319 Unspecified pre-existing diabetes mellitus in pregnancy, unspecified trimester: Secondary | ICD-10-CM | POA: Insufficient documentation

## 2019-02-01 DIAGNOSIS — D259 Leiomyoma of uterus, unspecified: Secondary | ICD-10-CM

## 2019-02-01 DIAGNOSIS — O34219 Maternal care for unspecified type scar from previous cesarean delivery: Secondary | ICD-10-CM | POA: Diagnosis not present

## 2019-02-01 DIAGNOSIS — O24410 Gestational diabetes mellitus in pregnancy, diet controlled: Secondary | ICD-10-CM

## 2019-02-01 NOTE — MAU Provider Note (Signed)
Chief Complaint  Patient presents with  . Hypertension     First Provider Initiated Contact with Patient 02/01/19 2032      S: Danielle Kent  is a 33 y.o. y.o. year old G58P1001 female at [redacted]w[redacted]d weeks gestation who presents to MAU with elevated blood pressures. Was seen at [facility] today for f/u ultrasound & had severe range BP of 188/100. Hx of chronic hypertension. Current blood pressure medication: clonodine 0.2 mg TID, labetalol 200 mg TID, hydralazine 100 mg TID, & nifedipine 60 mg BID.   Associated symptoms: Denies Headache, Denies vision changes, denies epigastric pain Contractions: denies Vaginal bleeding: denies Fetal movement: normal  O:  Patient Vitals for the past 24 hrs:  BP Temp Temp src Pulse Resp SpO2 Height Weight  02/01/19 2009 (!) 157/88 - - 91 20 - - -  02/01/19 1953 (!) 160/92 98.8 F (37.1 C) Oral 94 20 98 % - -  02/01/19 1940 - - - - - - 5' (1.524 m) (!) 168.7 kg   General: NAD Heart: Regular rate Lungs: Normal rate and effort Abd: Soft, NT, Gravid, S=D Extremities: 3+ pitting Pedal edema Neuro: 2+ deep tendon reflexes, No clonus  NST:  Baseline: 150 bpm, Accelerations: Non-reactive but appropriate for gestational age and Decelerations: Absent  No results found for this or any previous visit (from the past 24 hour(s)).  A: [redacted]w[redacted]d week IUP Chronic hypertension -BPs improved in MAU. Per review of chart, patient has poorly controlled CHTN & is often in severe range. No symptoms of PEC today. Will defer PEC labs today as patient is at her BP baseline & without symptoms.  -limited tracing of baby d/t morbid obesity & early gestational age  P: Discharge home in stable condition per consult with Truett Mainland, DO. Preeclampsia precautions. Keep scheduled appointment in office tomorrow Return to maternity admissions as needed in [place], [place], NP 02/01/2019 8:42 PM

## 2019-02-01 NOTE — MAU Note (Signed)
Pt here for BP check after BP was elevated in the office today. Denies any bleeding or leaking.

## 2019-02-01 NOTE — Discharge Instructions (Signed)
Hypertension During Pregnancy ° °Hypertension, commonly called high blood pressure, is when the force of blood pumping through your arteries is too strong. Arteries are blood vessels that carry blood from the heart throughout the body. Hypertension during pregnancy can cause problems for you and your baby. Your baby may be born early (prematurely) or may not weigh as much as he or she should at birth. Very bad cases of hypertension during pregnancy can be life-threatening. °Different types of hypertension can occur during pregnancy. These include: °· Chronic hypertension. This happens when: °? You have hypertension before pregnancy and it continues during pregnancy. °? You develop hypertension before you are [redacted] weeks pregnant, and it continues during pregnancy. °· Gestational hypertension. This is hypertension that develops after the 20th week of pregnancy. °· Preeclampsia, also called toxemia of pregnancy. This is a very serious type of hypertension that develops during pregnancy. It can be very dangerous for you and your baby. °? In rare cases, you may develop preeclampsia after giving birth (postpartum preeclampsia). This usually occurs within 48 hours after childbirth but may occur up to 6 weeks after giving birth. °Gestational hypertension and preeclampsia usually go away within 6 weeks after your baby is born. Women who have hypertension during pregnancy have a greater chance of developing hypertension later in life or during future pregnancies. °What are the causes? °The exact cause of hypertension during pregnancy is not known. °What increases the risk? °There are certain factors that make it more likely for you to develop hypertension during pregnancy. These include: °· Having hypertension during a previous pregnancy or prior to pregnancy. °· Being overweight. °· Being age 35 or older. °· Being pregnant for the first time. °· Being pregnant with more than one baby. °· Becoming pregnant using fertilization  methods such as IVF (in vitro fertilization). °· Having diabetes, kidney problems, or systemic lupus erythematosus. °· Having a family history of hypertension. °What are the signs or symptoms? °Chronic hypertension and gestational hypertension rarely cause symptoms. Preeclampsia causes symptoms, which may include: °· Increased protein in your urine. Your health care provider will check for this at every visit before you give birth (prenatal visit). °· Severe headaches. °· Sudden weight gain. °· Swelling of the hands, face, legs, and feet. °· Nausea and vomiting. °· Vision problems, such as blurred or double vision. °· Numbness in the face, arms, legs, and feet. °· Dizziness. °· Slurred speech. °· Sensitivity to bright lights. °· Abdominal pain. °· Convulsions or seizures. °How is this diagnosed? °You may be diagnosed with hypertension during a routine prenatal exam. At each prenatal visit, you may: °· Have a urine test to check for high amounts of protein in your urine. °· Have your blood pressure checked. A blood pressure reading is given as two numbers, such as "120 over 80" (or 120/80). The first ("top") number is a measure of the pressure in your arteries when your heart beats (systolic pressure). The second ("bottom") number is a measure of the pressure in your arteries as your heart relaxes between beats (diastolic pressure). Blood pressure is measured in a unit called mm Hg. For most women, a normal blood pressure reading is: °? Systolic: below 120. °? Diastolic: below 80. °The type of hypertension that you are diagnosed with depends on your test results and when your symptoms developed. °· Chronic hypertension is usually diagnosed before 20 weeks of pregnancy. °· Gestational hypertension is usually diagnosed after 20 weeks of pregnancy. °· Hypertension with high amounts of protein in   the urine is diagnosed as preeclampsia. °· Blood pressure measurements that stay above 160 systolic, or above 110 diastolic,  are signs of severe preeclampsia. °How is this treated? °Treatment for hypertension during pregnancy varies depending on the type of hypertension you have and how serious it is. °· If you take medicines called ACE inhibitors to treat chronic hypertension, you may need to switch medicines. ACE inhibitors should not be taken during pregnancy. °· If you have gestational hypertension, you may need to take blood pressure medicine. °· If you are at risk for preeclampsia, your health care provider may recommend that you take a low-dose aspirin during your pregnancy. °· If you have severe preeclampsia, you may need to be hospitalized so you and your baby can be monitored closely. You may also need to take medicine (magnesium sulfate) to prevent seizures and to lower blood pressure. This medicine may be given as an injection or through an IV. °· In some cases, if your condition gets worse, you may need to deliver your baby early. °Follow these instructions at home: °Eating and drinking ° °· Drink enough fluid to keep your urine pale yellow. °· Avoid caffeine. °Lifestyle °· Do not use any products that contain nicotine or tobacco, such as cigarettes and e-cigarettes. If you need help quitting, ask your health care provider. °· Do not use alcohol or drugs. °· Avoid stress as much as possible. Rest and get plenty of sleep. °General instructions °· Take over-the-counter and prescription medicines only as told by your health care provider. °· While lying down, lie on your left side. This keeps pressure off your major blood vessels. °· While sitting or lying down, raise (elevate) your feet. Try putting some pillows under your lower legs. °· Exercise regularly. Ask your health care provider what kinds of exercise are best for you. °· Keep all prenatal and follow-up visits as told by your health care provider. This is important. °Contact a health care provider if: °· You have symptoms that your health care provider told you may  require more treatment or monitoring, such as: °? Nausea or vomiting. °? Headache. °Get help right away if you have: °· Severe abdominal pain that does not get better with treatment. °· A severe headache that does not get better. °· Vomiting that does not get better. °· Sudden, rapid weight gain. °· Sudden swelling in your hands, ankles, or face. °· Vaginal bleeding. °· Blood in your urine. °· Fewer movements from your baby than usual. °· Blurred or double vision. °· Muscle twitching or sudden muscle tightening (spasms). °· Shortness of breath. °· Blue fingernails or lips. °Summary °· Hypertension, commonly called high blood pressure, is when the force of blood pumping through your arteries is too strong. °· Hypertension during pregnancy can cause problems for you and your baby. °· Treatment for hypertension during pregnancy varies depending on the type of hypertension you have and how serious it is. °· Get help right away if you have symptoms that your health care provider told you to watch for. °This information is not intended to replace advice given to you by your health care provider. Make sure you discuss any questions you have with your health care provider. °Document Released: 06/24/2011 Document Revised: 09/22/2017 Document Reviewed: 03/21/2016 °Elsevier Interactive Patient Education © 2019 [employer] Inc. ° °

## 2019-02-02 ENCOUNTER — Encounter: Payer: Medicaid Other | Admitting: Obstetrics and Gynecology

## 2019-02-02 ENCOUNTER — Ambulatory Visit (INDEPENDENT_AMBULATORY_CARE_PROVIDER_SITE_OTHER): Payer: Medicaid Other | Admitting: Obstetrics and Gynecology

## 2019-02-02 VITALS — BP 159/105 | HR 94 | Wt 376.8 lb

## 2019-02-02 DIAGNOSIS — O24319 Unspecified pre-existing diabetes mellitus in pregnancy, unspecified trimester: Secondary | ICD-10-CM

## 2019-02-02 DIAGNOSIS — O24312 Unspecified pre-existing diabetes mellitus in pregnancy, second trimester: Secondary | ICD-10-CM

## 2019-02-02 DIAGNOSIS — Z3A23 23 weeks gestation of pregnancy: Secondary | ICD-10-CM

## 2019-02-02 DIAGNOSIS — O10912 Unspecified pre-existing hypertension complicating pregnancy, second trimester: Secondary | ICD-10-CM

## 2019-02-02 DIAGNOSIS — O10919 Unspecified pre-existing hypertension complicating pregnancy, unspecified trimester: Secondary | ICD-10-CM

## 2019-02-02 MED ORDER — LABETALOL HCL 200 MG PO TABS: 300.0000 mg | ORAL_TABLET | Freq: Three times a day (TID) | ORAL | 1 refills | Status: DC

## 2019-02-02 NOTE — Progress Notes (Signed)
Pt presents for a Rob. Pt bp is elevated today. Pt does not complain of having any headaches, epigastric pain, or visual disturbances. She does have some swelling in hands and feet.

## 2019-02-02 NOTE — Progress Notes (Signed)
Prenatal Visit Note Date: 02/02/2019 Clinic: [facility]  Subjective:  Danielle Kent is a 33 y.o. G2P1001 at [redacted]w[redacted]d being seen today for ongoing prenatal care.  She is currently monitored for the following issues for this high-risk pregnancy and has Supervision of high risk pregnancy, antepartum; Hypertensive urgency; Previous cesarean section; Chronic hypertension during pregnancy, antepartum; Maternal morbid obesity, antepartum ([place]); and Preexisting diabetes complicating pregnancy, antepartum on their problem list.  Patient reports no complaints.   Contractions: Not present. Vag. Bleeding: None.  Movement: Present. Denies leaking of fluid.   The following portions of the patient's history were reviewed and updated as appropriate: allergies, current medications, past family history, past medical history, past social history, past surgical history and problem list. Problem list updated.  Objective:   Vitals:   02/02/19 1622  BP: (!) 159/105  Pulse: 94  Weight: (!) 376 lb 12.8 oz (170.9 kg)    Fetal Status: Fetal Heart Rate (bpm): 145   Movement: Present     General:  Alert, oriented and cooperative. Patient is in no acute distress.  Skin: Skin is warm and dry. No rash noted.   Cardiovascular: Normal heart rate noted  Respiratory: Normal respiratory effort, no problems with respiration noted  Abdomen: Soft, gravid, appropriate for gestational age. Pain/Pressure: Absent     Pelvic:  Cervical exam deferred        Extremities: Normal range of motion.  Edema: Moderate pitting, indentation subsides rapidly  Mental Status: Normal mood and affect. Normal behavior. Normal judgment and thought content.   Urinalysis:      Assessment and Plan:  Pregnancy: G2P1001 at [redacted]w[redacted]d  1. Chronic hypertension during pregnancy, antepartum Normal growth yesterday. Pt confirms on labetalol 200 tid, clonidine 0.2 tid, hydralazine 100 tid and states she took her meds today. Pt has no s/s of pre-eclampsia. Will get  surveillance labs today. Referal for cardiology consult given. S/p normal echo 11/2018. Recommended she increase labetalol to 300 tid and RTC 1wk for bp check Recommended she start low dose asa 81mg  qday.  - CBC - Comprehensive metabolic panel - Ambulatory referral to Cardiology - Protein / creatinine ratio, urine  2. Preexisting diabetes complicating pregnancy, antepartum Pt gives normal numbers but didn't bring book or meter. Will get a1c.  - Hemoglobin A1c  3. Obesity in pregnancy Nearly 20 lbs weight gain since last visit. Will need to watch closely  4. Pregnancy Ask about btl papers next visit  Preterm labor symptoms and general obstetric precautions including but not limited to vaginal bleeding, contractions, leaking of fluid and fetal movement were reviewed in detail with the patient. Please refer to After Visit Summary for other counseling recommendations.  Return in about 1 week (around 02/09/2019) for rob-in person.   Aletha Halim, MD

## 2019-02-02 NOTE — Patient Instructions (Signed)
Start aspirin 81mg  by mouth daily  Increase labetalol to 300mg  by mouth three times a day  Look into getting compression stockings/TED hoses.

## 2019-02-03 LAB — CBC
Hematocrit: 34.4 % (ref 34.0–46.6)
Hemoglobin: 11.4 g/dL (ref 11.1–15.9)
MCH: 27.7 pg (ref 26.6–33.0)
MCHC: 33.1 g/dL (ref 31.5–35.7)
MCV: 84 fL (ref 79–97)
Platelets: 348 10*3/uL (ref 150–450)
RBC: 4.12 x10E6/uL (ref 3.77–5.28)
RDW: 14.5 % (ref 11.7–15.4)
WBC: 9.3 10*3/uL (ref 3.4–10.8)

## 2019-02-03 LAB — COMPREHENSIVE METABOLIC PANEL
ALT: 15 IU/L (ref 0–32)
AST: 11 IU/L (ref 0–40)
Albumin/Globulin Ratio: 1.2 (ref 1.2–2.2)
Albumin: 3.5 g/dL — ABNORMAL LOW (ref 3.8–4.8)
Alkaline Phosphatase: 75 IU/L (ref 39–117)
BUN/Creatinine Ratio: 18 (ref 9–23)
BUN: 16 mg/dL (ref 6–20)
Bilirubin Total: <0.2 mg/dL (ref 0.0–1.2)
CO2: 22 mmol/L (ref 20–29)
Calcium: 9.7 mg/dL (ref 8.7–10.2)
Chloride: 100 mmol/L (ref 96–106)
Creatinine, Ser: 0.9 mg/dL (ref 0.57–1.00)
GFR calc Af Amer: 97 mL/min/{1.73_m2} (ref >59)
GFR calc non Af Amer: 84 mL/min/{1.73_m2} (ref >59)
Globulin, Total: 3 g/dL (ref 1.5–4.5)
Glucose: 90 mg/dL (ref 65–99)
Potassium: 4.3 mmol/L (ref 3.5–5.2)
Sodium: 135 mmol/L (ref 134–144)
Total Protein: 6.5 g/dL (ref 6.0–8.5)

## 2019-02-03 LAB — PROTEIN / CREATININE RATIO, URINE
Creatinine, Urine: 51.4 mg/dL
Protein, Ur: 8.2 mg/dL
Protein/Creat Ratio: 160 mg/g creat (ref 0–200)

## 2019-02-03 LAB — HEMOGLOBIN A1C
Est. average glucose Bld gHb Est-mCnc: 123 mg/dL
Hgb A1c MFr Bld: 5.9 % — ABNORMAL HIGH (ref 4.8–5.6)

## 2019-02-09 ENCOUNTER — Ambulatory Visit (INDEPENDENT_AMBULATORY_CARE_PROVIDER_SITE_OTHER): Payer: Medicaid Other | Admitting: Obstetrics & Gynecology

## 2019-02-09 ENCOUNTER — Other Ambulatory Visit: Payer: Self-pay

## 2019-02-09 ENCOUNTER — Encounter: Payer: Self-pay | Admitting: Obstetrics & Gynecology

## 2019-02-09 DIAGNOSIS — O0990 Supervision of high risk pregnancy, unspecified, unspecified trimester: Secondary | ICD-10-CM

## 2019-02-09 DIAGNOSIS — Z3A24 24 weeks gestation of pregnancy: Secondary | ICD-10-CM

## 2019-02-09 DIAGNOSIS — O0992 Supervision of high risk pregnancy, unspecified, second trimester: Secondary | ICD-10-CM

## 2019-02-09 MED ORDER — LABETALOL HCL 200 MG PO TABS: 400.0000 mg | ORAL_TABLET | Freq: Three times a day (TID) | ORAL | 1 refills | Status: DC

## 2019-02-09 NOTE — Progress Notes (Signed)
   PRENATAL VISIT NOTE  Subjective:  Danielle Kent is a 33 y.o. G2P1001 at [redacted]w[redacted]d being seen today for ongoing prenatal care.  She is currently monitored for the following issues for this high-risk pregnancy and has Supervision of high risk pregnancy, antepartum; Hypertensive urgency; Previous cesarean section; Chronic hypertension during pregnancy, antepartum; Maternal morbid obesity, antepartum ([place]); and Preexisting diabetes complicating pregnancy, antepartum on their problem list.  Patient reports no complaints.  Contractions: Not present. Vag. Bleeding: None.  Movement: Present. Denies leaking of fluid.   The following portions of the patient's history were reviewed and updated as appropriate: allergies, current medications, past family history, past medical history, past social history, past surgical history and problem list.   Objective:   Vitals:   02/09/19 1615  BP: (!) 174/108  Pulse: 96  Weight: (!) 372 lb (168.7 kg)    Fetal Status: Fetal Heart Rate (bpm): 152   Movement: Present     General:  Alert, oriented and cooperative. Patient is in no acute distress.  Skin: Skin is warm and dry. No rash noted.   Cardiovascular: Normal heart rate noted  Respiratory: Normal respiratory effort, no problems with respiration noted  Abdomen: Soft, gravid, appropriate for gestational age.  Pain/Pressure: Present     Pelvic: Cervical exam deferred        Extremities: Normal range of motion.  Edema: Trace  Mental Status: Normal mood and affect. Normal behavior. Normal judgment and thought content.   Assessment and Plan:  Pregnancy: G2P1001 at [redacted]w[redacted]d 1. Supervision of high risk pregnancy, antepartum HTN poorly controlled, has cardiology appt - US Fetal Echocardiography; Future - labetalol (NORMODYNE) 200 MG tablet; Take 2 tablets (400 mg total) by mouth 3 (three) times daily.  Dispense: 90 tablet; Refill: 1  Preterm labor symptoms and general obstetric precautions including but not limited  to vaginal bleeding, contractions, leaking of fluid and fetal movement were reviewed in detail with the patient. Please refer to After Visit Summary for other counseling recommendations.   Return in about 2 weeks (around 02/23/2019).  Future Appointments  Date Time Provider [place]  02/17/2019  8:30 AM Pixie Casino, MD [facility]  03/01/2019  1:30 PM [place] NURSE [place] [facility]  03/01/2019  1:30 PM [place] 1 [facility]    Emeterio Reeve, MD

## 2019-02-09 NOTE — Progress Notes (Signed)
ROB: No complaints , pt denies any HA's , no visual changes. Pt forgot to bring blood sugar log.

## 2019-02-09 NOTE — Patient Instructions (Signed)

## 2019-02-14 ENCOUNTER — Telehealth: Payer: Self-pay

## 2019-02-14 NOTE — Telephone Encounter (Signed)

## 2019-02-15 ENCOUNTER — Other Ambulatory Visit: Payer: Self-pay | Admitting: Advanced Practice Midwife

## 2019-02-15 DIAGNOSIS — O0990 Supervision of high risk pregnancy, unspecified, unspecified trimester: Secondary | ICD-10-CM

## 2019-02-16 ENCOUNTER — Telehealth: Payer: Self-pay | Admitting: Internal Medicine

## 2019-02-16 NOTE — Telephone Encounter (Signed)
[phone] my chart/ consent/ pre reg completed

## 2019-02-17 ENCOUNTER — Telehealth: Payer: Self-pay | Admitting: Licensed Clinical Social Worker

## 2019-02-17 ENCOUNTER — Telehealth (INDEPENDENT_AMBULATORY_CARE_PROVIDER_SITE_OTHER): Payer: Medicaid Other | Admitting: Internal Medicine

## 2019-02-17 DIAGNOSIS — I160 Hypertensive urgency: Secondary | ICD-10-CM

## 2019-02-17 DIAGNOSIS — I10 Essential (primary) hypertension: Secondary | ICD-10-CM

## 2019-02-17 DIAGNOSIS — O10919 Unspecified pre-existing hypertension complicating pregnancy, unspecified trimester: Secondary | ICD-10-CM

## 2019-02-17 DIAGNOSIS — Z3A25 25 weeks gestation of pregnancy: Secondary | ICD-10-CM | POA: Diagnosis not present

## 2019-02-17 NOTE — Progress Notes (Signed)
Virtual Visit via Telephone Note   This visit type was conducted due to national recommendations for restrictions regarding the COVID-19 Pandemic (e.g. social distancing) in an effort to limit this patient's exposure and mitigate transmission in our community.  Due to her co-morbid illnesses, this patient is at least at moderate risk for complications without adequate follow up.  This format is felt to be most appropriate for this patient at this time.  The patient did not have access to video technology/had technical difficulties with video requiring transitioning to audio format only (telephone).  All issues noted in this document were discussed and addressed.  No physical exam could be performed with this format.  Please refer to the patient's chart for her  consent to telehealth for [facility].   Evaluation Performed:  Telephone visit  Date:  02/17/2019   ID:  Danielle Kent, DOB 05/30/1986, MRN [id number]  Patient Location:  [address] [place] [address]  Provider location:   [address], [address] [place], [place] [address]  PCP:  Patient, No Pcp Per  Cardiologist:  No primary care provider on file. Electrophysiologist:  None   Chief Complaint:  New patient uncontrolled hypertension  History of Present Illness:    Danielle Kent is a 33 y.o. female who presents via [employer], civil (consulting) for a telehealth visit today.  Danielle Kent is a 33 year old female who is now [redacted] weeks pregnant with a second pregnancy.  She reported she had no problems with hypertension during her first pregnancy however she now has issues with significant hypertension.  She is morbidly obese.  Her blood pressure has been well over 254 systolic over 270 diastolic, in fact this was a reading when she had a recent echocardiogram in February.  This showed normal LVEF of 55 to 60%, normal wall thickness, mild left atrial enlargement and no regional wall motion abnormalities.  Since then she has had some  on and off chest pain and shortness of breath.  This seems to be coincident with uncontrolled hypertension.  Her OB has continue to uptitrate her medications.  She is currently on 4 medications, clonidine, hydralazine, labetalol and Procardia.  Most recently, her blood pressure in the office was 168/98 on 02/01/2019.  This was down from 205/142 on 01/21/2019.  Of note, she was also off that week for spring break and she noted that her blood pressure was better controlled during the week.  Unfortunately she does not have a home blood pressure cuff.  She apparently ordered 1 month ago but it was on back order.  She has not been able to get a home cuff and therefore we do not have a new blood pressure reading today to aid in management or titration of her medications.  The patient does not have symptoms concerning for COVID-19 infection (fever, chills, cough, or new SHORTNESS OF BREATH).    Prior CV studies:   The following studies were reviewed today:  Office visits and blood pressure readings  PMHx:  Past Medical History:  Diagnosis Date   Asthma    Hypertension     Past Surgical History:  Procedure Laterality Date   CESAREAN SECTION      FAMHx:  Family History  Problem Relation Age of Onset   Diabetes Mother    Hypertension Mother    Multiple sclerosis Mother    Asthma Father    Heart attack Maternal Grandfather 51    SOCHx:   reports that she has never smoked. She  has never used smokeless tobacco. She reports previous alcohol use. She reports previous drug use.  ALLERGIES:  No Known Allergies  MEDS:  No outpatient medications have been marked as taking for the 02/17/19 encounter (Telemedicine) with Pixie Casino, MD.     ROS: Pertinent items noted in HPI and remainder of comprehensive ROS otherwise negative.  Labs/Other Tests and Data Reviewed:    Recent Labs: 11/25/2018: Magnesium 1.8 12/06/2018: TSH 2.580 02/02/2019: ALT 15; BUN 16; Creatinine, Ser 0.90;  Hemoglobin 11.4; Platelets 348; Potassium 4.3; Sodium 135   Recent Lipid Panel No results found for: CHOL, TRIG, HDL, CHOLHDL, LDLCALC, LDLDIRECT  Wt Readings from Last 3 Encounters:  02/09/19 (!) 372 lb (168.7 kg)  02/02/19 (!) 376 lb 12.8 oz (170.9 kg)  02/01/19 (!) 372 lb (168.7 kg)     Exam:    Vital Signs:  LMP 08/31/2018 (Exact Date)    No exam due to telehealth visit  ASSESSMENT & PLAN:    1. Uncontrolled hypertension of pregnancy 2. [redacted] weeks pregnant 3. Morbid obesity  Unfortunately, [name] does not have a home blood pressure cuff therefore we were not able to obtain remote readings of her blood pressure today.  She says she ordered 1 over a month ago but is been on back order and has not received it.  This is critical for her to manage her blood pressure especially through the later stages of her pregnancy.  I will reach out to our heart failure clinic who recently established a program to provide blood pressure cuffs and scales for patients through a donation find for those in need.  Hopefully we can provide her with a blood pressure cuff and I would advise her to get twice daily readings that I could review again through virtual visit in about 2 weeks.  We also discussed staggering her medicines.  It does sound like she has some uncovered periods during the day where her medicines wear off.  If she could stagger the dose of her short acting medications (clonidine, hydralazine, labetalol) by 1 hour, this should help to decrease the trough in between medications.  Thanks again for the kind referral.  COVID-19 Education: The signs and symptoms of COVID-19 were discussed with the patient and how to seek care for testing (follow up with PCP or arrange E-visit).  The importance of social distancing was discussed today.  Patient Risk:   After full review of this patients clinical status, I feel that they are at least moderate risk at this time.  Time:   Today, I have spent 25  minutes with the patient with telehealth technology discussing pressure management, appropriate dosing times, warning signs and symptoms to look out for, and trying to arrange a blood pressure cuff.     Medication Adjustments/Labs and Tests Ordered: Current medicines are reviewed at length with the patient today.  Concerns regarding medicines are outlined above.   Tests Ordered: No orders of the defined types were placed in this encounter.   Medication Changes: No orders of the defined types were placed in this encounter.   Disposition:  in 2 week(s)  Pixie Casino, MD, [facility], [place] Director of the Advanced Lipid Disorders &  Cardiovascular Risk Reduction Clinic Diplomate of the American Board of Clinical Lipidology Attending Cardiologist  Direct Dial: [phone]   Fax: [phone]  Website:  [email or web address]  Pixie Casino, MD  02/17/2019 8:55 AM

## 2019-02-17 NOTE — Telephone Encounter (Signed)
CSW referred to assist patient with obtaining a BP cuff. CSW contacted patient to inform cuff will be delivered to her home next week. CSW also confirmed with patient that she is eating well and low sodium. Patient reports she is fine with food at the moment but will hold on to CSW number of needed. Patient grateful for support and assistance. CSW available as needed. Raquel Sarna, [place], [place]

## 2019-02-17 NOTE — Patient Instructions (Signed)
Medication Instructions:  Your Physician recommend you continue on your current medication as directed.    If you need a refill on your cardiac medications before your next appointment, please call your pharmacy.   Lab work: None  Testing/Procedures: None  Follow-Up: At [facility], you and your health needs are our priority.  As part of our continuing mission to provide you with exceptional heart care, we have created designated Provider Care Teams.  These Care Teams include your primary Cardiologist (physician) and Advanced Practice Providers (APPs -  Physician Assistants and Nurse Practitioners) who all work together to provide you with the care you need, when you need it. You will need a follow up appointment in 2 weeks.  Please call our office 2 months in advance to schedule this appointment.  You may see Dr. Debara Pickett or one of the following Advanced Practice Providers on your designated Care Team: Almyra Deforest, [place] . Fabian Sharp, PA-C

## 2019-02-23 ENCOUNTER — Ambulatory Visit (INDEPENDENT_AMBULATORY_CARE_PROVIDER_SITE_OTHER): Payer: Medicaid Other | Admitting: Obstetrics and Gynecology

## 2019-02-23 ENCOUNTER — Inpatient Hospital Stay (HOSPITAL_COMMUNITY)
Admission: AD | Admit: 2019-02-23 | Discharge: 2019-04-07 | DRG: 785 | Disposition: A | Discharge status: Home / Self Care | Payer: Medicaid Other | Attending: Obstetrics and Gynecology | Admitting: Obstetrics and Gynecology

## 2019-02-23 ENCOUNTER — Other Ambulatory Visit: Payer: Self-pay

## 2019-02-23 ENCOUNTER — Encounter: Payer: Self-pay | Admitting: Obstetrics and Gynecology

## 2019-02-23 ENCOUNTER — Encounter (HOSPITAL_COMMUNITY): Payer: Self-pay | Admitting: *Deleted

## 2019-02-23 ENCOUNTER — Inpatient Hospital Stay (HOSPITAL_COMMUNITY): Payer: Medicaid Other

## 2019-02-23 VITALS — BP 200/137 | HR 96 | Wt 380.4 lb

## 2019-02-23 DIAGNOSIS — Z3A31 31 weeks gestation of pregnancy: Secondary | ICD-10-CM | POA: Diagnosis not present

## 2019-02-23 DIAGNOSIS — O99214 Obesity complicating childbirth: Secondary | ICD-10-CM | POA: Diagnosis present

## 2019-02-23 DIAGNOSIS — Z3A27 27 weeks gestation of pregnancy: Secondary | ICD-10-CM | POA: Diagnosis not present

## 2019-02-23 DIAGNOSIS — O114 Pre-existing hypertension with pre-eclampsia, complicating childbirth: Principal | ICD-10-CM | POA: Diagnosis present

## 2019-02-23 DIAGNOSIS — O1002 Pre-existing essential hypertension complicating childbirth: Secondary | ICD-10-CM | POA: Diagnosis present

## 2019-02-23 DIAGNOSIS — Z3A29 29 weeks gestation of pregnancy: Secondary | ICD-10-CM | POA: Diagnosis not present

## 2019-02-23 DIAGNOSIS — O329XX0 Maternal care for malpresentation of fetus, unspecified, not applicable or unspecified: Secondary | ICD-10-CM | POA: Diagnosis present

## 2019-02-23 DIAGNOSIS — O322XX0 Maternal care for transverse and oblique lie, not applicable or unspecified: Secondary | ICD-10-CM | POA: Diagnosis present

## 2019-02-23 DIAGNOSIS — O321XX0 Maternal care for breech presentation, not applicable or unspecified: Secondary | ICD-10-CM | POA: Diagnosis not present

## 2019-02-23 DIAGNOSIS — N189 Chronic kidney disease, unspecified: Secondary | ICD-10-CM | POA: Diagnosis present

## 2019-02-23 DIAGNOSIS — Z3A28 28 weeks gestation of pregnancy: Secondary | ICD-10-CM | POA: Diagnosis not present

## 2019-02-23 DIAGNOSIS — O10919 Unspecified pre-existing hypertension complicating pregnancy, unspecified trimester: Secondary | ICD-10-CM

## 2019-02-23 DIAGNOSIS — Z3A26 26 weeks gestation of pregnancy: Secondary | ICD-10-CM | POA: Diagnosis not present

## 2019-02-23 DIAGNOSIS — O1413 Severe pre-eclampsia, third trimester: Secondary | ICD-10-CM | POA: Diagnosis not present

## 2019-02-23 DIAGNOSIS — O112 Pre-existing hypertension with pre-eclampsia, second trimester: Secondary | ICD-10-CM | POA: Diagnosis not present

## 2019-02-23 DIAGNOSIS — O99210 Obesity complicating pregnancy, unspecified trimester: Secondary | ICD-10-CM | POA: Diagnosis not present

## 2019-02-23 DIAGNOSIS — Z362 Encounter for other antenatal screening follow-up: Secondary | ICD-10-CM | POA: Diagnosis not present

## 2019-02-23 DIAGNOSIS — O10912 Unspecified pre-existing hypertension complicating pregnancy, second trimester: Secondary | ICD-10-CM

## 2019-02-23 DIAGNOSIS — Z3A33 33 weeks gestation of pregnancy: Secondary | ICD-10-CM | POA: Diagnosis not present

## 2019-02-23 DIAGNOSIS — Z302 Encounter for sterilization: Secondary | ICD-10-CM | POA: Diagnosis not present

## 2019-02-23 DIAGNOSIS — O119 Pre-existing hypertension with pre-eclampsia, unspecified trimester: Secondary | ICD-10-CM | POA: Diagnosis not present

## 2019-02-23 DIAGNOSIS — Z3A32 32 weeks gestation of pregnancy: Secondary | ICD-10-CM | POA: Diagnosis not present

## 2019-02-23 DIAGNOSIS — O34219 Maternal care for unspecified type scar from previous cesarean delivery: Secondary | ICD-10-CM | POA: Diagnosis present

## 2019-02-23 DIAGNOSIS — O10913 Unspecified pre-existing hypertension complicating pregnancy, third trimester: Secondary | ICD-10-CM | POA: Diagnosis not present

## 2019-02-23 DIAGNOSIS — O3412 Maternal care for benign tumor of corpus uteri, second trimester: Secondary | ICD-10-CM | POA: Diagnosis not present

## 2019-02-23 DIAGNOSIS — O99213 Obesity complicating pregnancy, third trimester: Secondary | ICD-10-CM | POA: Diagnosis not present

## 2019-02-23 DIAGNOSIS — I129 Hypertensive chronic kidney disease with stage 1 through stage 4 chronic kidney disease, or unspecified chronic kidney disease: Secondary | ICD-10-CM | POA: Diagnosis present

## 2019-02-23 DIAGNOSIS — O3413 Maternal care for benign tumor of corpus uteri, third trimester: Secondary | ICD-10-CM | POA: Diagnosis not present

## 2019-02-23 DIAGNOSIS — Z98891 History of uterine scar from previous surgery: Secondary | ICD-10-CM

## 2019-02-23 DIAGNOSIS — J45909 Unspecified asthma, uncomplicated: Secondary | ICD-10-CM | POA: Diagnosis not present

## 2019-02-23 DIAGNOSIS — O0990 Supervision of high risk pregnancy, unspecified, unspecified trimester: Secondary | ICD-10-CM

## 2019-02-23 DIAGNOSIS — D259 Leiomyoma of uterus, unspecified: Secondary | ICD-10-CM | POA: Diagnosis not present

## 2019-02-23 DIAGNOSIS — O289 Unspecified abnormal findings on antenatal screening of mother: Secondary | ICD-10-CM | POA: Diagnosis not present

## 2019-02-23 DIAGNOSIS — O1490 Unspecified pre-eclampsia, unspecified trimester: Secondary | ICD-10-CM

## 2019-02-23 DIAGNOSIS — O99212 Obesity complicating pregnancy, second trimester: Secondary | ICD-10-CM | POA: Diagnosis not present

## 2019-02-23 DIAGNOSIS — O1410 Severe pre-eclampsia, unspecified trimester: Secondary | ICD-10-CM

## 2019-02-23 DIAGNOSIS — O24313 Unspecified pre-existing diabetes mellitus in pregnancy, third trimester: Secondary | ICD-10-CM | POA: Diagnosis not present

## 2019-02-23 DIAGNOSIS — E1122 Type 2 diabetes mellitus with diabetic chronic kidney disease: Secondary | ICD-10-CM | POA: Diagnosis present

## 2019-02-23 DIAGNOSIS — O288 Other abnormal findings on antenatal screening of mother: Secondary | ICD-10-CM

## 2019-02-23 DIAGNOSIS — Z6841 Body Mass Index (BMI) 40.0 and over, adult: Secondary | ICD-10-CM

## 2019-02-23 DIAGNOSIS — Z1159 Encounter for screening for other viral diseases: Secondary | ICD-10-CM | POA: Diagnosis not present

## 2019-02-23 DIAGNOSIS — O9989 Other specified diseases and conditions complicating pregnancy, childbirth and the puerperium: Secondary | ICD-10-CM | POA: Diagnosis not present

## 2019-02-23 DIAGNOSIS — O113 Pre-existing hypertension with pre-eclampsia, third trimester: Secondary | ICD-10-CM | POA: Diagnosis not present

## 2019-02-23 DIAGNOSIS — R030 Elevated blood-pressure reading, without diagnosis of hypertension: Secondary | ICD-10-CM | POA: Diagnosis present

## 2019-02-23 DIAGNOSIS — O365931 Maternal care for other known or suspected poor fetal growth, third trimester, fetus 1: Secondary | ICD-10-CM

## 2019-02-23 DIAGNOSIS — I10 Essential (primary) hypertension: Secondary | ICD-10-CM

## 2019-02-23 DIAGNOSIS — O24319 Unspecified pre-existing diabetes mellitus in pregnancy, unspecified trimester: Secondary | ICD-10-CM | POA: Diagnosis present

## 2019-02-23 DIAGNOSIS — O0992 Supervision of high risk pregnancy, unspecified, second trimester: Secondary | ICD-10-CM

## 2019-02-23 DIAGNOSIS — Z3A30 30 weeks gestation of pregnancy: Secondary | ICD-10-CM | POA: Diagnosis not present

## 2019-02-23 DIAGNOSIS — O24410 Gestational diabetes mellitus in pregnancy, diet controlled: Secondary | ICD-10-CM | POA: Diagnosis not present

## 2019-02-23 LAB — CBC WITH DIFFERENTIAL/PLATELET
Abs Immature Granulocytes: 0.07 10*3/uL (ref 0.00–0.07)
Basophils Absolute: 0 10*3/uL (ref 0.0–0.1)
Basophils Relative: 0 %
Eosinophils Absolute: 0.2 10*3/uL (ref 0.0–0.5)
Eosinophils Relative: 2 %
HCT: 37.7 % (ref 36.0–46.0)
Hemoglobin: 12.4 g/dL (ref 12.0–15.0)
Immature Granulocytes: 1 %
Lymphocytes Relative: 18 %
Lymphs Abs: 2.1 10*3/uL (ref 0.7–4.0)
MCH: 28.3 pg (ref 26.0–34.0)
MCHC: 32.9 g/dL (ref 30.0–36.0)
MCV: 86.1 fL (ref 80.0–100.0)
Monocytes Absolute: 0.9 10*3/uL (ref 0.1–1.0)
Monocytes Relative: 8 %
Neutro Abs: 8.7 10*3/uL — ABNORMAL HIGH (ref 1.7–7.7)
Neutrophils Relative %: 71 %
Platelets: 338 10*3/uL (ref 150–400)
RBC: 4.38 MIL/uL (ref 3.87–5.11)
RDW: 14.2 % (ref 11.5–15.5)
WBC: 12.1 10*3/uL — ABNORMAL HIGH (ref 4.0–10.5)
nRBC: 0 % (ref 0.0–0.2)

## 2019-02-23 LAB — RAPID URINE DRUG SCREEN, HOSP PERFORMED
Amphetamines: NOT DETECTED
Barbiturates: NOT DETECTED
Benzodiazepines: NOT DETECTED
Cocaine: NOT DETECTED
Opiates: NOT DETECTED
Tetrahydrocannabinol: NOT DETECTED

## 2019-02-23 LAB — URINALYSIS, ROUTINE W REFLEX MICROSCOPIC
Bilirubin Urine: NEGATIVE
Glucose, UA: NEGATIVE mg/dL
Hgb urine dipstick: NEGATIVE
Ketones, ur: NEGATIVE mg/dL
Leukocytes,Ua: NEGATIVE
Nitrite: NEGATIVE
Protein, ur: 30 mg/dL — AB
Specific Gravity, Urine: 1.011 (ref 1.005–1.030)
pH: 6 (ref 5.0–8.0)

## 2019-02-23 LAB — TYPE AND SCREEN
ABO/RH(D): O POS
Antibody Screen: NEGATIVE

## 2019-02-23 LAB — PROTEIN / CREATININE RATIO, URINE
Creatinine, Urine: 60.12 mg/dL
Protein Creatinine Ratio: 0.48 mg/mg{Cre} — ABNORMAL HIGH (ref 0.00–0.15)
Total Protein, Urine: 29 mg/dL

## 2019-02-23 LAB — COMPREHENSIVE METABOLIC PANEL
ALT: 15 U/L (ref 0–44)
AST: 19 U/L (ref 15–41)
Albumin: 2.9 g/dL — ABNORMAL LOW (ref 3.5–5.0)
Alkaline Phosphatase: 83 U/L (ref 38–126)
Anion gap: 14 (ref 5–15)
BUN: 15 mg/dL (ref 6–20)
CO2: 20 mmol/L — ABNORMAL LOW (ref 22–32)
Calcium: 9.8 mg/dL (ref 8.9–10.3)
Chloride: 100 mmol/L (ref 98–111)
Creatinine, Ser: 1.15 mg/dL — ABNORMAL HIGH (ref 0.44–1.00)
GFR calc Af Amer: >60 mL/min (ref >60)
GFR calc non Af Amer: >60 mL/min (ref >60)
Glucose, Bld: 111 mg/dL — ABNORMAL HIGH (ref 70–99)
Potassium: 4.8 mmol/L (ref 3.5–5.1)
Sodium: 134 mmol/L — ABNORMAL LOW (ref 135–145)
Total Bilirubin: 0.5 mg/dL (ref 0.3–1.2)
Total Protein: 6.3 g/dL — ABNORMAL LOW (ref 6.5–8.1)

## 2019-02-23 LAB — ABO/RH: ABO/RH(D): O POS

## 2019-02-23 MED ORDER — LABETALOL HCL 5 MG/ML IV SOLN
20.0000 mg | INTRAVENOUS | Status: DC | PRN
  Administered 2019-02-23 – 2019-03-21 (×6): 20 mg via INTRAVENOUS
  Filled 2019-02-23 (×7): qty 4

## 2019-02-23 MED ORDER — BETAMETHASONE SOD PHOS & ACET 6 (3-3) MG/ML IJ SUSP
12.0000 mg | INTRAMUSCULAR | Status: AC
  Administered 2019-02-23 – 2019-02-24 (×2): 12 mg via INTRAMUSCULAR
  Filled 2019-02-23 (×2): qty 2

## 2019-02-23 MED ORDER — CALCIUM CARBONATE ANTACID 500 MG PO CHEW: 2.0000 | CHEWABLE_TABLET | ORAL | Status: DC | PRN

## 2019-02-23 MED ORDER — MAGNESIUM SULFATE BOLUS VIA INFUSION
6.0000 g | Freq: Once | INTRAVENOUS | Status: AC
  Administered 2019-02-23: 20:00:00 6 g via INTRAVENOUS
  Filled 2019-02-23: qty 500

## 2019-02-23 MED ORDER — MAGNESIUM SULFATE 40 G IN LACTATED RINGERS - SIMPLE
2.0000 g/h | INTRAVENOUS | Status: AC
  Administered 2019-02-23 – 2019-02-24 (×2): 2 g/h via INTRAVENOUS
  Filled 2019-02-23: qty 500

## 2019-02-23 MED ORDER — HYDRALAZINE HCL 20 MG/ML IJ SOLN
10.0000 mg | INTRAMUSCULAR | Status: DC | PRN
  Administered 2019-02-23: 10 mg via INTRAVENOUS
  Filled 2019-02-23: qty 1

## 2019-02-23 MED ORDER — PRENATAL MULTIVITAMIN CH
1.0000 | ORAL_TABLET | Freq: Every day | ORAL | Status: DC
  Administered 2019-02-24 – 2019-04-02 (×37): 1 via ORAL
  Filled 2019-02-23 (×38): qty 1

## 2019-02-23 MED ORDER — LABETALOL HCL 5 MG/ML IV SOLN: 40.0000 mg | INTRAVENOUS | Status: DC | PRN

## 2019-02-23 MED ORDER — HYDRALAZINE HCL 20 MG/ML IJ SOLN
5.0000 mg | INTRAMUSCULAR | Status: DC | PRN
  Administered 2019-02-23: 5 mg via INTRAVENOUS

## 2019-02-23 MED ORDER — NIFEDIPINE 10 MG PO CAPS: 20.0000 mg | ORAL_CAPSULE | ORAL | Status: DC | PRN

## 2019-02-23 MED ORDER — ZOLPIDEM TARTRATE 5 MG PO TABS: 5.0000 mg | ORAL_TABLET | Freq: Every evening | ORAL | Status: DC | PRN

## 2019-02-23 MED ORDER — HYDRALAZINE HCL 20 MG/ML IJ SOLN
INTRAMUSCULAR | Status: AC
  Administered 2019-02-23: 18:00:00 5 mg via INTRAVENOUS
  Filled 2019-02-23: qty 1

## 2019-02-23 MED ORDER — HYDRALAZINE HCL 20 MG/ML IJ SOLN
10.0000 mg | INTRAMUSCULAR | Status: DC | PRN
  Administered 2019-02-23 – 2019-03-21 (×7): 10 mg via INTRAVENOUS
  Filled 2019-02-23 (×6): qty 1

## 2019-02-23 MED ORDER — LABETALOL HCL 5 MG/ML IV SOLN
40.0000 mg | INTRAVENOUS | Status: DC | PRN
  Administered 2019-02-24 – 2019-03-02 (×2): 40 mg via INTRAVENOUS
  Filled 2019-02-23 (×4): qty 8

## 2019-02-23 MED ORDER — HYDRALAZINE HCL 20 MG/ML IJ SOLN
5.0000 mg | INTRAMUSCULAR | Status: DC | PRN
  Administered 2019-02-23 – 2019-03-21 (×15): 5 mg via INTRAVENOUS
  Filled 2019-02-23 (×17): qty 1

## 2019-02-23 MED ORDER — NIFEDIPINE 10 MG PO CAPS: 10.0000 mg | ORAL_CAPSULE | ORAL | Status: DC | PRN

## 2019-02-23 MED ORDER — ACETAMINOPHEN 500 MG PO TABS
1000.0000 mg | ORAL_TABLET | Freq: Once | ORAL | Status: AC
  Administered 2019-02-23: 1000 mg via ORAL
  Filled 2019-02-23: qty 2

## 2019-02-23 MED ORDER — LABETALOL HCL 5 MG/ML IV SOLN
20.0000 mg | INTRAVENOUS | Status: DC | PRN
  Administered 2019-02-23: 18:00:00 20 mg via INTRAVENOUS
  Filled 2019-02-23: qty 4

## 2019-02-23 MED ORDER — LABETALOL HCL 5 MG/ML IV SOLN
40.0000 mg | INTRAVENOUS | Status: DC | PRN
  Administered 2019-02-23: 40 mg via INTRAVENOUS
  Filled 2019-02-23: qty 8

## 2019-02-23 MED ORDER — DOCUSATE SODIUM 100 MG PO CAPS
100.0000 mg | ORAL_CAPSULE | Freq: Every day | ORAL | Status: DC
  Administered 2019-02-24 – 2019-04-02 (×9): 100 mg via ORAL
  Filled 2019-02-23 (×21): qty 1

## 2019-02-23 MED ORDER — LACTATED RINGERS IV SOLN
INTRAVENOUS | Status: DC
  Administered 2019-02-23 – 2019-02-24 (×3): via INTRAVENOUS

## 2019-02-23 MED ORDER — ACETAMINOPHEN 325 MG PO TABS
650.0000 mg | ORAL_TABLET | ORAL | Status: DC | PRN
  Administered 2019-03-24: 650 mg via ORAL
  Filled 2019-02-23 (×2): qty 2

## 2019-02-23 MED ORDER — LACTATED RINGERS IV BOLUS
1000.0000 mL | Freq: Once | INTRAVENOUS | Status: AC
  Administered 2019-02-23: 18:00:00 1000 mL via INTRAVENOUS

## 2019-02-23 NOTE — Progress Notes (Signed)
Pt is here for ROB. [redacted]w[redacted]d. Pts BP is severely elevated, consulted with Dr. Elly Modena who advises patient to go straight to the hospital for evaluation. Pt verbalizes understanding.

## 2019-02-23 NOTE — Progress Notes (Signed)
Patient sent to MAU due to severely BP. Expressed concerns that this may be superimposed preeclampsia and preterm delivery may be required. Patient verbalized understanding and all questions were answered

## 2019-02-23 NOTE — MAU Provider Note (Signed)
History     CSN: [id number]  Arrival date and time: 02/23/19 1708   First Provider Initiated Contact with Patient 02/23/19 1724      Chief Complaint  Patient presents with  . Hypertension   Danielle Kent is a 33 y.o. G2P1001 at 56w3dwho presents today from the office due to elevated blood pressure. Patient has CHTN and is on Labetalol 4076mTID, Clonidine, Hydralazine and Procardia. She states that she is taking all her meds as prescribed. She has been taking her blood pressure at home. She reports that on Monday it started to be a little higher, and has continued to be higher each day since. She arrived for her visit at the office today, and was sent here due to severe hypertension. She had a headache yesterday and took tylenol. She also has headache today, but she has not taken anything for headache at this time.   Current Meds: procardia XL 6067mID, Clonidine 0.2md109mD, Hyralazine 100mg66m, labetalol 400mg 14m Hypertension  This is a new problem. The current episode started today. The problem is unchanged. The problem is uncontrolled. Associated symptoms include headaches. Associated agents: pregnancy  Risk factors for coronary artery disease include obesity. Past treatments include calcium channel blockers and beta blockers. The current treatment provides mild improvement. There are no compliance problems.     OB History    Gravida  2   Para  1   Term  1   Preterm      AB      Living  1     SAB      TAB      Ectopic      Multiple      Live Births  1           Past Medical History:  Diagnosis Date  . Asthma   . Hypertension     Past Surgical History:  Procedure Laterality Date  . CESAREAN SECTION      Family History  Problem Relation Age of Onset  . Diabetes Mother   . Hypertension Mother   . Multiple sclerosis Mother   . Asthma Father   . Heart attack Maternal Grandfather 57    Social History   Tobacco Use  . Smoking status: Never Smoker   . Smokeless tobacco: Never Used  Substance Use Topics  . Alcohol use: Not Currently  . Drug use: Not Currently    Allergies: No Known Allergies  Medications Prior to Admission  Medication Sig Dispense Refill Last Dose  . Accu-Chek FastClix Lancets MISC 1 Units by Percutaneous route 4 (four) times daily. 100 each 12 Taking  . aspirin EC 81 MG tablet Take 1 tablet (81 mg total) by mouth daily. Take after 12 weeks for prevention of preeclampsia later in pregnancy 300 tablet 2 Taking  . Blood Glucose Monitoring Suppl (ACCU-CHEK NANO SMARTVIEW) w/Device KIT 1 kit by Subdermal route as directed. Check blood sugars for fasting, and two hours after breakfast, lunch and dinner (4 checks daily) 1 kit 0 Taking  . cloNIDine (CATAPRES) 0.2 MG tablet Take 1 tablet (0.2 mg total) by mouth 3 (three) times daily. 90 tablet 1 Taking  . glucose blood (ACCU-CHEK SMARTVIEW) test strip Use as instructed to check blood sugars 100 each 12 Taking  . hydrALAZINE (APRESOLINE) 100 MG tablet Take 1 tablet (100 mg total) by mouth 3 (three) times daily. 90 tablet 1 Taking  . labetalol (NORMODYNE) 200 MG tablet TAKE 1 TABLET BY  MOUTH THREE TIMES DAILY 270 tablet 0   . NIFEdipine (PROCARDIA XL) 60 MG 24 hr tablet Take 1 tablet (60 mg total) by mouth 2 (two) times daily. 60 tablet 1 Taking  . Prenatal Vit-Fe Fumarate-FA (PRENATAL VITAMIN) 27-0.8 MG TABS Take 1 tablet by mouth daily. 30 tablet 0 Taking    Review of Systems  Constitutional: Negative for chills and fever.  Gastrointestinal: Negative for abdominal pain, nausea and vomiting.  Genitourinary: Negative for pelvic pain, vaginal bleeding and vaginal discharge.  Neurological: Positive for headaches.   Physical Exam   Blood pressure (!) 209/127, pulse 96, temperature 98.3 F (36.8 C), resp. rate 18, last menstrual period 08/31/2018, SpO2 96 %.  Physical Exam  Nursing note and vitals reviewed. Constitutional: She is oriented to person, place, and time. She  appears well-developed and well-nourished. No distress.  HENT:  Head: Normocephalic.  Cardiovascular: Normal rate.  Respiratory: Effort normal.  GI: Soft. There is no abdominal tenderness. There is no rebound.  Musculoskeletal:        General: Edema (to about mid-calf ) present.  Neurological: She is alert and oriented to person, place, and time. She has normal reflexes.  Skin: Skin is warm and dry.   NST:  Baseline: 145 Variability: minimal Accels: none Decels: none Toco: none    Results for orders placed or performed during the hospital encounter of 02/23/19 (from the past 24 hour(s))  Protein / creatinine ratio, urine     Status: Abnormal   Collection Time: 02/23/19  5:21 PM  Result Value Ref Range   Creatinine, Urine 60.12 mg/dL   Total Protein, Urine 29 mg/dL   Protein Creatinine Ratio 0.48 (H) 0.00 - 0.15 mg/mg[Cre]  Urinalysis, Routine w reflex microscopic     Status: Abnormal   Collection Time: 02/23/19  5:21 PM  Result Value Ref Range   Color, Urine YELLOW YELLOW   APPearance CLOUDY (A) CLEAR   Specific Gravity, Urine 1.011 1.005 - 1.030   pH 6.0 5.0 - 8.0   Glucose, UA NEGATIVE NEGATIVE mg/dL   Hgb urine dipstick NEGATIVE NEGATIVE   Bilirubin Urine NEGATIVE NEGATIVE   Ketones, ur NEGATIVE NEGATIVE mg/dL   Protein, ur 30 (A) NEGATIVE mg/dL   Nitrite NEGATIVE NEGATIVE   Leukocytes,Ua NEGATIVE NEGATIVE   RBC / HPF 0-5 0 - 5 RBC/hpf   WBC, UA 0-5 0 - 5 WBC/hpf   Bacteria, UA RARE (A) NONE SEEN   Squamous Epithelial / LPF 6-10 0 - 5  Urine rapid drug screen (hosp performed)     Status: None   Collection Time: 02/23/19  5:21 PM  Result Value Ref Range   Opiates NONE DETECTED NONE DETECTED   Cocaine NONE DETECTED NONE DETECTED   Benzodiazepines NONE DETECTED NONE DETECTED   Amphetamines NONE DETECTED NONE DETECTED   Tetrahydrocannabinol NONE DETECTED NONE DETECTED   Barbiturates NONE DETECTED NONE DETECTED  CBC with Differential/Platelet     Status: Abnormal    Collection Time: 02/23/19  5:40 PM  Result Value Ref Range   WBC 12.1 (H) 4.0 - 10.5 K/uL   RBC 4.38 3.87 - 5.11 MIL/uL   Hemoglobin 12.4 12.0 - 15.0 g/dL   HCT 37.7 36.0 - 46.0 %   MCV 86.1 80.0 - 100.0 fL   MCH 28.3 26.0 - 34.0 pg   MCHC 32.9 30.0 - 36.0 g/dL   RDW 14.2 11.5 - 15.5 %   Platelets 338 150 - 400 K/uL   nRBC 0.0 0.0 - 0.2 %  Neutrophils Relative % 71 %   Neutro Abs 8.7 (H) 1.7 - 7.7 K/uL   Lymphocytes Relative 18 %   Lymphs Abs 2.1 0.7 - 4.0 K/uL   Monocytes Relative 8 %   Monocytes Absolute 0.9 0.1 - 1.0 K/uL   Eosinophils Relative 2 %   Eosinophils Absolute 0.2 0.0 - 0.5 K/uL   Basophils Relative 0 %   Basophils Absolute 0.0 0.0 - 0.1 K/uL   Immature Granulocytes 1 %   Abs Immature Granulocytes 0.07 0.00 - 0.07 K/uL  Comprehensive metabolic panel     Status: Abnormal   Collection Time: 02/23/19  5:40 PM  Result Value Ref Range   Sodium 134 (L) 135 - 145 mmol/L   Potassium 4.8 3.5 - 5.1 mmol/L   Chloride 100 98 - 111 mmol/L   CO2 20 (L) 22 - 32 mmol/L   Glucose, Bld 111 (H) 70 - 99 mg/dL   BUN 15 6 - 20 mg/dL   Creatinine, Ser 1.15 (H) 0.44 - 1.00 mg/dL   Calcium 9.8 8.9 - 10.3 mg/dL   Total Protein 6.3 (L) 6.5 - 8.1 g/dL   Albumin 2.9 (L) 3.5 - 5.0 g/dL   AST 19 15 - 41 U/L   ALT 15 0 - 44 U/L   Alkaline Phosphatase 83 38 - 126 U/L   Total Bilirubin 0.5 0.3 - 1.2 mg/dL   GFR calc non Af Amer >60 >60 mL/min   GFR calc Af Amer >60 >60 mL/min   Anion gap 14 5 - 15    MAU Course  Procedures  MDM  IV started on arrival Hydralazine protocol started Tylenol for headache  CBC/CMET/P:Cr/UDS   6:44 PM consult with Dr. Elonda Husky, will continue to hydralazine IV, Magnesium, BMZ, Dopplers/growth tomorrow AM  Assessment and Plan   1. Chronic hypertension with superimposed preeclampsia    Admit to [facility] Magnesium Hydralazine protocol BMZ [place] for growth/dopplers on 02/24/2019  Marcille Buffy DNP, CNM  02/23/19  5:53 PM

## 2019-02-23 NOTE — MAU Note (Signed)
Pt presents to MAU after being evaluated at Dr Meyer Cory for HTN. Pt states she is on multiple medications for her blood pressure. Reports slight headache and lower abdominal cramping

## 2019-02-24 ENCOUNTER — Inpatient Hospital Stay (HOSPITAL_COMMUNITY): Payer: Medicaid Other

## 2019-02-24 DIAGNOSIS — O24410 Gestational diabetes mellitus in pregnancy, diet controlled: Secondary | ICD-10-CM

## 2019-02-24 DIAGNOSIS — Z362 Encounter for other antenatal screening follow-up: Secondary | ICD-10-CM

## 2019-02-24 DIAGNOSIS — O99210 Obesity complicating pregnancy, unspecified trimester: Secondary | ICD-10-CM

## 2019-02-24 DIAGNOSIS — O3412 Maternal care for benign tumor of corpus uteri, second trimester: Secondary | ICD-10-CM

## 2019-02-24 DIAGNOSIS — O10912 Unspecified pre-existing hypertension complicating pregnancy, second trimester: Secondary | ICD-10-CM

## 2019-02-24 DIAGNOSIS — D259 Leiomyoma of uterus, unspecified: Secondary | ICD-10-CM

## 2019-02-24 DIAGNOSIS — O113 Pre-existing hypertension with pre-eclampsia, third trimester: Secondary | ICD-10-CM

## 2019-02-24 DIAGNOSIS — Z3A26 26 weeks gestation of pregnancy: Secondary | ICD-10-CM

## 2019-02-24 DIAGNOSIS — O112 Pre-existing hypertension with pre-eclampsia, second trimester: Secondary | ICD-10-CM

## 2019-02-24 DIAGNOSIS — O34219 Maternal care for unspecified type scar from previous cesarean delivery: Secondary | ICD-10-CM

## 2019-02-24 MED ORDER — CLONIDINE HCL 0.2 MG PO TABS
0.2000 mg | ORAL_TABLET | Freq: Three times a day (TID) | ORAL | Status: DC
  Administered 2019-02-24: 09:00:00 0.2 mg via ORAL
  Filled 2019-02-24: qty 1

## 2019-02-24 MED ORDER — LABETALOL HCL 200 MG PO TABS
800.0000 mg | ORAL_TABLET | Freq: Three times a day (TID) | ORAL | Status: DC
  Administered 2019-02-24 – 2019-02-28 (×13): 800 mg via ORAL
  Filled 2019-02-24 (×13): qty 4

## 2019-02-24 MED ORDER — HYDRALAZINE HCL 50 MG PO TABS
100.0000 mg | ORAL_TABLET | Freq: Three times a day (TID) | ORAL | Status: DC
  Administered 2019-02-24 – 2019-03-01 (×16): 100 mg via ORAL
  Filled 2019-02-24 (×16): qty 2

## 2019-02-24 MED ORDER — ENOXAPARIN SODIUM 80 MG/0.8ML ~~LOC~~ SOLN
80.0000 mg | SUBCUTANEOUS | Status: DC
  Administered 2019-02-24 – 2019-03-01 (×6): 80 mg via SUBCUTANEOUS
  Filled 2019-02-24 (×6): qty 0.8

## 2019-02-24 MED ORDER — NIFEDIPINE ER OSMOTIC RELEASE 30 MG PO TB24
60.0000 mg | ORAL_TABLET | Freq: Two times a day (BID) | ORAL | Status: DC
  Administered 2019-02-24 – 2019-03-07 (×23): 60 mg via ORAL
  Filled 2019-02-24 (×24): qty 2

## 2019-02-24 MED ORDER — BUTALBITAL-APAP-CAFFEINE 50-325-40 MG PO TABS
2.0000 | ORAL_TABLET | Freq: Once | ORAL | Status: AC
  Administered 2019-02-24: 01:00:00 2 via ORAL
  Filled 2019-02-24: qty 2

## 2019-02-24 MED ORDER — CLONIDINE HCL 0.3 MG PO TABS
0.3000 mg | ORAL_TABLET | Freq: Three times a day (TID) | ORAL | Status: DC
  Administered 2019-02-24 – 2019-02-28 (×12): 0.3 mg via ORAL
  Filled 2019-02-24 (×16): qty 1

## 2019-02-24 NOTE — Progress Notes (Signed)
Dr. Elonda Husky updated via phone of patient elevated BP and Labetalol IV x2 given;  Fetal monitoring flat / with absent variabilities.  No new orders made .  Will continue monitoring.

## 2019-02-24 NOTE — Plan of Care (Signed)
Pt. To start PO meds for cHTN and superimposed pregnancy HTN. Pt. States she has a history of HTN and prior to pregnancy took Lisonopril. MD has ordered new HTN agents to lower BPs this AM. Will continue to monitor.

## 2019-02-24 NOTE — Progress Notes (Signed)
FACULTY PRACTICE ANTEPARTUM(COMPREHENSIVE) NOTE  Danielle Kent is a 33 y.o. G2P1001 with Estimated Date of Delivery: 05/29/19   By   [redacted]w[redacted]d  who is admitted for CHT with SIPE, severe range pressures.    Fetal presentation is unsure. Length of Stay:  1  Days  Date of admission:02/23/2019  Subjective: Pt headache resolved with fioricet Patient reports the fetal movement as active. Patient reports uterine contraction  activity as none. Patient reports  vaginal bleeding as none. Patient describes fluid per vagina as None.  Vitals:  Blood pressure (!) 159/89, pulse 89, temperature 99 F (37.2 C), temperature source Oral, resp. rate 20, height 5' (1.524 m), weight (!) 172.5 kg, last menstrual period 08/31/2018, SpO2 97 %. Vitals:   02/24/19 0525 02/24/19 0625 02/24/19 0626 02/24/19 0639  BP:  (!) 168/90 (!) 164/82 (!) 159/89  Pulse:  91 89 89  Resp: 18 18 18 20   Temp:  99 F (37.2 C)    TempSrc:  Oral    SpO2: 98% 98%  97%  Weight:      Height:       Physical Examination:  General appearance - alert, well appearing, and in no distress Extremities - minimal swelling Fundal Height:  size equals dates Pelvic Exam:  , examination not indicated Cervical Exam: Extremities: extremities normal, atraumatic, no cyanosis or edema with DTRs 2+ bilaterally Membranes:intact  Fetal Monitoring:  reassuring for 26 weeks     Labs:  Results for orders placed or performed during the hospital encounter of 02/23/19 (from the past 24 hour(s))  Protein / creatinine ratio, urine   Collection Time: 02/23/19  5:21 PM  Result Value Ref Range   Creatinine, Urine 60.12 mg/dL   Total Protein, Urine 29 mg/dL   Protein Creatinine Ratio 0.48 (H) 0.00 - 0.15 mg/mg[Cre]  Urinalysis, Routine w reflex microscopic   Collection Time: 02/23/19  5:21 PM  Result Value Ref Range   Color, Urine YELLOW YELLOW   APPearance CLOUDY (A) CLEAR   Specific Gravity, Urine 1.011 1.005 - 1.030   pH 6.0 5.0 - 8.0   Glucose, UA  NEGATIVE NEGATIVE mg/dL   Hgb urine dipstick NEGATIVE NEGATIVE   Bilirubin Urine NEGATIVE NEGATIVE   Ketones, ur NEGATIVE NEGATIVE mg/dL   Protein, ur 30 (A) NEGATIVE mg/dL   Nitrite NEGATIVE NEGATIVE   Leukocytes,Ua NEGATIVE NEGATIVE   RBC / HPF 0-5 0 - 5 RBC/hpf   WBC, UA 0-5 0 - 5 WBC/hpf   Bacteria, UA RARE (A) NONE SEEN   Squamous Epithelial / LPF 6-10 0 - 5  Urine rapid drug screen (hosp performed)   Collection Time: 02/23/19  5:21 PM  Result Value Ref Range   Opiates NONE DETECTED NONE DETECTED   Cocaine NONE DETECTED NONE DETECTED   Benzodiazepines NONE DETECTED NONE DETECTED   Amphetamines NONE DETECTED NONE DETECTED   Tetrahydrocannabinol NONE DETECTED NONE DETECTED   Barbiturates NONE DETECTED NONE DETECTED  Type and screen [place]   Collection Time: 02/23/19  5:35 PM  Result Value Ref Range   ABO/RH(D) O POS    Antibody Screen NEG    Sample Expiration      02/26/2019,2359 Performed at [facility], [address] [place], [place] [address]   ABO/Rh   Collection Time: 02/23/19  5:35 PM  Result Value Ref Range   ABO/RH(D)      O POS Performed at [facility] [address], [place], [place] [address]   CBC  with Differential/Platelet   Collection Time: 02/23/19  5:40 PM  Result Value Ref Range   WBC 12.1 (H) 4.0 - 10.5 K/uL   RBC 4.38 3.87 - 5.11 MIL/uL   Hemoglobin 12.4 12.0 - 15.0 g/dL   HCT 37.7 36.0 - 46.0 %   MCV 86.1 80.0 - 100.0 fL   MCH 28.3 26.0 - 34.0 pg   MCHC 32.9 30.0 - 36.0 g/dL   RDW 14.2 11.5 - 15.5 %   Platelets 338 150 - 400 K/uL   nRBC 0.0 0.0 - 0.2 %   Neutrophils Relative % 71 %   Neutro Abs 8.7 (H) 1.7 - 7.7 K/uL   Lymphocytes Relative 18 %   Lymphs Abs 2.1 0.7 - 4.0 K/uL   Monocytes Relative 8 %   Monocytes Absolute 0.9 0.1 - 1.0 K/uL   Eosinophils Relative 2 %   Eosinophils Absolute 0.2 0.0 - 0.5 K/uL   Basophils Relative 0 %   Basophils Absolute 0.0 0.0 - 0.1 K/uL   Immature Granulocytes 1  %   Abs Immature Granulocytes 0.07 0.00 - 0.07 K/uL  Comprehensive metabolic panel   Collection Time: 02/23/19  5:40 PM  Result Value Ref Range   Sodium 134 (L) 135 - 145 mmol/L   Potassium 4.8 3.5 - 5.1 mmol/L   Chloride 100 98 - 111 mmol/L   CO2 20 (L) 22 - 32 mmol/L   Glucose, Bld 111 (H) 70 - 99 mg/dL   BUN 15 6 - 20 mg/dL   Creatinine, Ser 1.15 (H) 0.44 - 1.00 mg/dL   Calcium 9.8 8.9 - 10.3 mg/dL   Total Protein 6.3 (L) 6.5 - 8.1 g/dL   Albumin 2.9 (L) 3.5 - 5.0 g/dL   AST 19 15 - 41 U/L   ALT 15 0 - 44 U/L   Alkaline Phosphatase 83 38 - 126 U/L   Total Bilirubin 0.5 0.3 - 1.2 mg/dL   GFR calc non Af Amer >60 >60 mL/min   GFR calc Af Amer >60 >60 mL/min   Anion gap 14 5 - 15    Imaging Studies:    Pending today.  Medications:  Scheduled . betamethasone acetate-betamethasone sodium phosphate  12 mg Intramuscular Q24H  . docusate sodium  100 mg Oral Daily  . prenatal multivitamin  1 tablet Oral Q1200   I have reviewed the patient's current medications.  ASSESSMENT: G2P1001 [redacted]w[redacted]d Estimated Date of Delivery: 05/29/19   Patient Active Problem List   Diagnosis Date Noted  . Chronic hypertension with superimposed preeclampsia 02/23/2019  . Preexisting diabetes complicating pregnancy, antepartum 12/27/2018  . Previous cesarean section 12/06/2018  . Chronic hypertension during pregnancy, antepartum 12/06/2018  . Maternal morbid obesity, antepartum ([place]) 12/06/2018  . Supervision of high risk pregnancy, antepartum 11/25/2018  . Hypertensive urgency 11/25/2018    PLAN: >continue magnesium sulfate prophylaxis >BMZ series being administered >Increase oral labetalol >continue alternating doses of hydralazine and labetalol for BP spike management >sonogram today >MFM consult today  Danielle Kent Danielle Kent 02/24/2019,7:29 AM

## 2019-02-24 NOTE — Progress Notes (Signed)
Pt. Ordered for renal u/s. Call to Dr. Kennon Rounds. Pt. Mag gtt will be at 24 hrs at 2015 this PM. Dr. Kennon Rounds to d/c Mag for 2015 this PM. Renal U/S will be completed after Mag so that pt. Can transfer to u/s for completion. Dr. Kennon Rounds ok with plan for renal u/s to be completed later or even tomorrow. Renal u/s # [phone]. Update of this plan called to Alyssa in U/S.    Pt's BP has lowered during the day with scheduled agents as was the POC. No complaint of headache throughout the day (pt. Stated she had only had a headache during her 6G bolus). Pt. Did report feeling symptomatic of low BP when systolic dropped below 307P. Assured pt. We were aware and would continuously check on her and to call for all OOB needs. Pt. In agreement with plan and called for OOB needs throughout shift.   No further needs at this time.

## 2019-02-24 NOTE — Progress Notes (Signed)
Pt. States at this time her BP feels "Low" (166/78) states she lives in the 180s/190s. Says she even feels "droosy" MFM ultrasound underway. Pt. Needs to go to restroom. States if her BP is dropped lower may impare her ability to ambulate. Once MFM ultrasound has left room at 0837 pt. Ambulated with 2 staff to restroom. Pt. Safe from harm. Sitting on SOB (0840). Pt. Will eat tray on SOB. After meal pt. Will lie down for Cont. FHR. Call bell at bedside.

## 2019-02-24 NOTE — Consult Note (Signed)
Maternal-Fetal Medicine (Telehealth Consultation)   Name: Danielle Kent MRN: [id number] Requesting Provider: Tania Ade, MD  I had the pleasure of seeing Danielle Kent today who is admitted at the [facility]. Following severe range blood pressures, she was sent over to the MAU yesterday and later admitted.  Patient has chronic hypertension that required 4 antihypertensives for control and she reports she has been taking antihypertensives as prescribed since Feb 2020. She was taking labetalol 200 mg tid, Procardia XL 60 mg daily clonidine 0.2 mg daily, and hydralazine 100 mg 3 times daily.  Her admission blood pressures yesterday were 209/127 and 215/113 mm Hg. She had IV antihypertensive treatment initially with labetalol followed by hydralazine. Labetalol dosage was increased to 800 mg tid and today morning Procardia was increased to 90 mg. She is also taking lovenox prophylaxis.  Patient had EKG on 11/25/18 at ED that was reported as "sinus tachycardia with nonspecific ST and T wave abnormality."  She also received first dose of betamethasone. Patient does not have headaches now or visual disturbances or right upper quadrant pain or vaginal bleeding. She reports good fetal movements. She also has gestational diabetes that is well-controlled on diet. Past medical history is also significant for mild intermittent asthma (stable). She was admitted at 13-weeks' gestation with malignant hypertension.  PSH: Cesarean section. Allergies: NKDA. Social: Denies tobacco or drug or alcohol use. Her partner is not the father of her first child.  Obstetric history is significant for a term cesarean delivery in 2011.  BP (12 hours): 131-185/66-93 mm Hg.  Labs: Hb 12.4, Hct 37.7, PLT 338, WBC 12.1, ALT 15, AAST 19, creatinine 1.15 (increased). UTOX negative. Protein/creatinine ratio 0.48. On ultrasound, fetal growth is appropriate for gestational age. Amniotic fluid is normal and good fetal activity is  seen. Umbilical artery Doppler showed normal diastolic flow. Placenta is anterior and there is no evidence of previa or accreta.   I counseled the patient on the following: Chronic hypertension with possible superimposed preeclampsia: Patient has long-standing hypertension for unknown duration. Her systolic blood pressures have been in the 200s at 13-weeks' gestation when she was admitted. She is taking 4 antihypertensive medications. I discussed superimposed preeclampsia that is difficult to diagnose with long-standing high blood pressures. Protein/creatinine ratio was increased in March (6 weeks ago).  I discussed the possible complications including maternal stroke, eclampsia, and end-organ damage. Fetal risks include growth restriction. Placental abruption is more common in chronic hypertension/ preeclampsia.  It is important to control her blood pressures to prevent maternal complications. Treatment of uncontrolled blood pressure in superimposed preeclampsia is delivery (after steroids if possible). Since it is difficult to differentiate, we would attempt to control BP by increasing antihypertensives (Procardia and labetalol). If blood pressures are not controlled with increasing antihypertensive dosage, we would recommend delivery. Delivery does not guarantee control of blood pressure. Patient understands delivery will lead to birth of a very preterm infant and prolonged NICU stay.  I reassured her of normal labs. Serum creatinine is increased from recent values. However, it was increased 6 weeks ago. Patient does not have oliguria.  If blood pressures are not well-controlled with increasing dosage of current antihypertensives, ICU consultation may be obtained to consider IV nitroglycerine drip.   Recommendations: -ICU/Medical consultation to discuss IV nitro drip. Other causes of hypertension should be explored. -Discontinue lovenox and start heparin 7,500 units twice daily. -Baseline  PT/PTT. -Delivery is recommended if blood pressures are not controlled with current antihypertensive regimen and nitroglycerine is not  an option. -Complete steroid course. -Repeat creatinine, LFTs and platelets tomorrow.

## 2019-02-24 NOTE — H&P (Signed)
Chief Complaint  Patient presents with  . Hypertension   Danielle Kent is a 33 y.o. G2P1001 at 84w3dwho presents today from the office due to elevated blood pressure. Patient has CHTN and is on Labetalol 4025mTID, Clonidine, Hydralazine and Procardia. She states that she is taking all her meds as prescribed. She has been taking her blood pressure at home. She reports that on Monday it started to be a little higher, and has continued to be higher each day since. She arrived for her visit at the office today, and was sent here due to severe hypertension. She had a headache yesterday and took tylenol. She also has headache today, but she has not taken anything for headache at this time.  Current Meds:  procardia XL 6045mID, Clonidine 0.2md59mD, Hyralazine 100mg64m, labetalol 400mg 59m Hypertension  This is a new problem. The current episode started today. The problem is unchanged. The problem is uncontrolled. Associated symptoms include headaches. Associated agents: pregnancy Risk factors for coronary artery disease include obesity. Past treatments include calcium channel blockers and beta blockers. The current treatment provides mild improvement. There are no compliance problems.           OB History     Gravida Para Term Preterm AB Living   _0 SAB TAB Ectopic Multiple Live Births        1           Past Medical History:  Diagnosis Date  . Asthma   . Hypertension         Past Surgical History:  Procedure Laterality Date  . CESAREAN SECTION          Family History  Problem Relation Age of Onset  . Diabetes Mother   . Hypertension Mother   . Multiple sclerosis Mother   . Asthma Father   . Heart attack Maternal Grandfather 57   Social History       Tobacco Use  . Smoking status: Never Smoker  . Smokeless tobacco: Never Used  Substance Use Topics  . Alcohol use: Not Currently  . Drug use: Not Currently   Allergies: No Known Allergies         Medications  Prior to Admission  Medication Sig Dispense Refill Last Dose  . Accu-Chek FastClix Lancets MISC 1 Units by Percutaneous route 4 (four) times daily. 100 each 12 Taking  . aspirin EC 81 MG tablet Take 1 tablet (81 mg total) by mouth daily. Take after 12 weeks for prevention of preeclampsia later in pregnancy 300 tablet 2 Taking  . Blood Glucose Monitoring Suppl (ACCU-CHEK NANO SMARTVIEW) w/Device KIT 1 kit by Subdermal route as directed. Check blood sugars for fasting, and two hours after breakfast, lunch and dinner (4 checks daily) 1 kit 0 Taking  . cloNIDine (CATAPRES) 0.2 MG tablet Take 1 tablet (0.2 mg total) by mouth 3 (three) times daily. 90 tablet 1 Taking  . glucose blood (ACCU-CHEK SMARTVIEW) test strip Use as instructed to check blood sugars 100 each 12 Taking  . hydrALAZINE (APRESOLINE) 100 MG tablet Take 1 tablet (100 mg total) by mouth 3 (three) times daily. 90 tablet 1 Taking  . labetalol (NORMODYNE) 200 MG tablet TAKE 1 TABLET BY MOUTH THREE TIMES DAILY 270 tablet 0   . NIFEdipine (PROCARDIA XL) 60 MG 24 hr tablet Take 1 tablet (60 mg total) by mouth 2 (two) times daily. 60 tablet 1 Taking  . Prenatal  Vit-Fe Fumarate-FA (PRENATAL VITAMIN) 27-0.8 MG TABS Take 1 tablet by mouth daily. 30 tablet 0 Taking   Review of Systems  Constitutional: Negative for chills and fever.  Gastrointestinal: Negative for abdominal pain, nausea and vomiting.  Genitourinary: Negative for pelvic pain, vaginal bleeding and vaginal discharge.  Neurological: Positive for headaches.   Physical Exam  Blood pressure (!) 209/127, pulse 96, temperature 98.3 F (36.8 C), resp. rate 18, last menstrual period 08/31/2018, SpO2 96 %.  Physical Exam  Nursing note and vitals reviewed.  Constitutional: She is oriented to person, place, and time. She appears well-developed and well-nourished. No distress.  HENT:  Head: Normocephalic.  Cardiovascular: Normal rate.  Respiratory: Effort normal.  GI: Soft. There is no  abdominal tenderness. There is no rebound.  Musculoskeletal:  General: Edema (to about mid-calf ) present.  Neurological: She is alert and oriented to person, place, and time. She has normal reflexes.  Skin: Skin is warm and dry.   NST:  Baseline: 145  Variability: minimal  Accels: none  Decels: none  Toco: none  Lab Results Last 24 Hours  MAU Course  Procedures  MDM  IV started on arrival  Hydralazine protocol started  Tylenol for headache  CBC/CMET/P:Cr/UDS  6:44 PM consult with Dr. Elonda Husky, will continue to hydralazine IV, Magnesium, BMZ, Dopplers/growth tomorrow AM  Assessment and Plan   1. Chronic hypertension with superimposed preeclampsia    Admit to [facility]  Magnesium  Hydralazine protocol  BMZ  [place] for growth/dopplers on 02/24/2019  Marcille Buffy DNP, CNM  02/23/19 5:53 PM    Have to assume this deterioration in BP management status is related to now superimposed pre eclampsia certainly with severe range pressures with enough elevation of her BP from baseline tro qualify as that as well  Labs otherwise stable, she does have a headache but no visual changes  As above begin magnesium, betamethasone series,  IV labetalol and apresoline alternating for BP spikes, sonogram tomorrow and have MFM weigh in on management plan  Florian Buff, MD

## 2019-02-25 LAB — COMPREHENSIVE METABOLIC PANEL
ALT: 15 U/L (ref 0–44)
AST: 15 U/L (ref 15–41)
Albumin: 2.6 g/dL — ABNORMAL LOW (ref 3.5–5.0)
Alkaline Phosphatase: 63 U/L (ref 38–126)
Anion gap: 11 (ref 5–15)
BUN: 20 mg/dL (ref 6–20)
CO2: 24 mmol/L (ref 22–32)
Calcium: 8.5 mg/dL — ABNORMAL LOW (ref 8.9–10.3)
Chloride: 100 mmol/L (ref 98–111)
Creatinine, Ser: 1.24 mg/dL — ABNORMAL HIGH (ref 0.44–1.00)
GFR calc Af Amer: >60 mL/min (ref >60)
GFR calc non Af Amer: 57 mL/min — ABNORMAL LOW (ref >60)
Glucose, Bld: 136 mg/dL — ABNORMAL HIGH (ref 70–99)
Potassium: 4.8 mmol/L (ref 3.5–5.1)
Sodium: 135 mmol/L (ref 135–145)
Total Bilirubin: 0.3 mg/dL (ref 0.3–1.2)
Total Protein: 6.3 g/dL — ABNORMAL LOW (ref 6.5–8.1)

## 2019-02-25 LAB — CBC
HCT: 33.8 % — ABNORMAL LOW (ref 36.0–46.0)
Hemoglobin: 11.2 g/dL — ABNORMAL LOW (ref 12.0–15.0)
MCH: 28.5 pg (ref 26.0–34.0)
MCHC: 33.1 g/dL (ref 30.0–36.0)
MCV: 86 fL (ref 80.0–100.0)
Platelets: 323 10*3/uL (ref 150–400)
RBC: 3.93 MIL/uL (ref 3.87–5.11)
RDW: 14.6 % (ref 11.5–15.5)
WBC: 13 10*3/uL — ABNORMAL HIGH (ref 4.0–10.5)
nRBC: 0 % (ref 0.0–0.2)

## 2019-02-25 LAB — ANA: Anti Nuclear Antibody (ANA): NEGATIVE

## 2019-02-25 LAB — CORTISOL: Cortisol, Plasma: 1.3 ug/dL

## 2019-02-25 MED FILL — Magnesium Sulfate Inj 50%: INTRAMUSCULAR | Qty: 80 | Status: AC

## 2019-02-25 MED FILL — Lactated Ringer's Solution: INTRAVENOUS | Qty: 500 | Status: AC

## 2019-02-25 NOTE — Progress Notes (Signed)
Continuing to have difficulty tracing FHR. RN and Agricultural consultant both attempted. MAU nurse at bedside holding monitors on pt for an NST.   Nicholes Calamity, RN

## 2019-02-25 NOTE — Progress Notes (Signed)
DR Wilhemina Cash to report   difficulty   Of obtaining     Monitoring    Ordered TID    4 RNS  At bedside  For assistance   Bedside US used   DR DOVE  ANSWERED  Call

## 2019-02-25 NOTE — Progress Notes (Signed)
Called Dr. Rip Harbour to update on difficulty keeping pt on continuous monitoring. MD would like to keep pt on continuous until morning rounds. Will continue to monitor.  Nicholes Calamity, RN

## 2019-02-25 NOTE — Progress Notes (Signed)
Initial Nutrition Assessment  DOCUMENTATION CODES:  Morbid obesity  INTERVENTION:  Gestational Diabetic CHO modified Diet Double protein portions if rerquested  NUTRITION DIAGNOSIS:  Increased nutrient needs related to (pregnancy and fetal growth requirements) as evidenced by (26 weeks IUP). GOAL:  Patient will meet greater than or equal to 90% of their needs  MONITOR:  Labs  REASON FOR ASSESSMENT:  Antenatal, Gestational Diabetes   ASSESSMENT:  26 5/7 weeks IUP, admitted due to HTN. Hx of DM, morbid obesity. BMI 67. generous weight gain to date, 36 lbs   Diet Order:   Diet Order            Diet gestational carb mod Room service appropriate? Yes; Fluid consistency: Thin  Diet effective now             EDUCATION NEEDS:   No education needs have been identified at this time(Outpt GDM diet educ 3/20)  Skin:  Skin Assessment: Reviewed RN Assessment  Height:   Ht Readings from Last 1 Encounters:  02/24/19 5' (1.524 m)   Weight:   Wt Readings from Last 1 Encounters:  02/24/19 (!) 172.5 kg    Ideal Body Weight:   100 lbs  BMI:  Body mass index is 74.27 kg/m.  Estimated Nutritional Needs:   Kcal:  2500-2700  Protein:  110-120 g  Fluid:  2.8 L    Weyman Rodney M.Fredderick Severance LDN Neonatal Nutrition Support Specialist/RD III Pager [phone]      Phone [phone]

## 2019-02-25 NOTE — Progress Notes (Signed)
Pt on continuous monitoring, has been difficult to trace overnight. Pt breathes heavily in her sleep and moves her abdomen which moves the monitors. Will continue to monitor.

## 2019-02-25 NOTE — Progress Notes (Signed)
Patient ID: Danielle Kent, female   DOB: 1985/11/24, 33 y.o.   MRN: [id number] Danielle Kent) NOTE  Danielle Kent is a 33 y.o. G2P1001 at [redacted]w[redacted]d by early ultrasound who is admitted for poorly controlled hypertension.   Fetal presentation is transversed Length of Stay:  2  Days  Subjective: Feels well Patient reports the fetal movement as active. Patient reports uterine contraction  activity as none. Patient reports  vaginal bleeding as none. Patient describes fluid per vagina as None.  Vitals:  Blood pressure (!) 147/78, pulse 86, temperature 98.9 F (37.2 C), temperature source Oral, resp. rate 20, height 5' (1.524 m), weight (!) 172.5 kg, last menstrual period 08/31/2018, SpO2 97 %. Physical Examination:  General appearance - alert, well appearing, and in no distress Heart - normal rate and regular rhythm Abdomen - soft, nontender, nondistended Fundal Height:  size equals dates Cervical Exam: Not evaluated. Extremities: extremities normal, atraumatic, no cyanosis or edema and Homans sign is negative, no sign of DVT with DTRs 2+ bilaterally Membranes:intact  Fetal Monitoring:  Baseline: 135 bpm, Variability: Fair (1-6 bpm), Accelerations: Non-reactive but appropriate for gestational age and Decelerations: Absent  Labs:  Results for orders placed or performed during the hospital encounter of 02/23/19 (from the past 24 hour(s))  CBC   Collection Time: 02/25/19  7:15 AM  Result Value Ref Range   WBC 13.0 (H) 4.0 - 10.5 K/uL   RBC 3.93 3.87 - 5.11 MIL/uL   Hemoglobin 11.2 (L) 12.0 - 15.0 g/dL   HCT 33.8 (L) 36.0 - 46.0 %   MCV 86.0 80.0 - 100.0 fL   MCH 28.5 26.0 - 34.0 pg   MCHC 33.1 30.0 - 36.0 g/dL   RDW 14.6 11.5 - 15.5 %   Platelets 323 150 - 400 K/uL   nRBC 0.0 0.0 - 0.2 %  Comprehensive metabolic panel   Collection Time: 02/25/19  7:15 AM  Result Value Ref Range   Sodium 135 135 - 145 mmol/L   Potassium 4.8 3.5 - 5.1 mmol/L   Chloride 100 98 - 111  mmol/L   CO2 24 22 - 32 mmol/L   Glucose, Bld 136 (H) 70 - 99 mg/dL   BUN 20 6 - 20 mg/dL   Creatinine, Ser 1.24 (H) 0.44 - 1.00 mg/dL   Calcium 8.5 (L) 8.9 - 10.3 mg/dL   Total Protein 6.3 (L) 6.5 - 8.1 g/dL   Albumin 2.6 (L) 3.5 - 5.0 g/dL   AST 15 15 - 41 U/L   ALT 15 0 - 44 U/L   Alkaline Phosphatase 63 38 - 126 U/L   Total Bilirubin 0.3 0.3 - 1.2 mg/dL   GFR calc non Af Amer 57 (L) >60 mL/min   GFR calc Af Amer >60 >60 mL/min   Anion gap 11 5 - 15  Cortisol   Collection Time: 02/25/19  7:15 AM  Result Value Ref Range   Cortisol, Plasma 1.3 ug/dL     Medications:  Scheduled . cloNIDine  0.3 mg Oral TID  . docusate sodium  100 mg Oral Daily  . enoxaparin (LOVENOX) injection  80 mg Subcutaneous Q24H  . hydrALAZINE  100 mg Oral Q8H  . labetalol  800 mg Oral TID  . NIFEdipine  60 mg Oral BID  . prenatal multivitamin  1 tablet Oral Q1200   I have reviewed the patient's current medications.  ASSESSMENT: Patient Active Problem List   Diagnosis Date Noted  . Chronic hypertension with superimposed preeclampsia 02/23/2019  .  Preexisting diabetes complicating pregnancy, antepartum 12/27/2018  . Previous cesarean section 12/06/2018  . Chronic hypertension during pregnancy, antepartum 12/06/2018  . Maternal morbid obesity, antepartum ([place]) 12/06/2018  . Supervision of high risk pregnancy, antepartum 11/25/2018  . Hypertensive urgency 11/25/2018    PLAN: BP is better controlled out of severe range. Monitor q shift, continue present medications  Emeterio Reeve 02/25/2019,9:40 AM

## 2019-02-26 DIAGNOSIS — O112 Pre-existing hypertension with pre-eclampsia, second trimester: Secondary | ICD-10-CM

## 2019-02-26 DIAGNOSIS — I10 Essential (primary) hypertension: Secondary | ICD-10-CM | POA: Diagnosis present

## 2019-02-26 NOTE — Progress Notes (Signed)
FHR difficult to trace even with RN present in room holding ultrasound. FHR audible at times during monitoring in the 140 range but not graphed because of maternal habitus and displacement of ultrasound of abdominal muscles when sleeping.

## 2019-02-26 NOTE — Progress Notes (Signed)
Patient ID: Danielle Kent, female   DOB: 05/08/1986, 33 y.o.   MRN: [id number] [place]) NOTE  Danielle Kent is a 33 y.o. G2P1001 at [redacted]w[redacted]d by best clinical estimate who is admitted for hypertension.   Fetal presentation is transverse. Length of Stay:  3  Days  Subjective: Feels well. No headache Patient reports the fetal movement as active. Patient reports uterine contraction  activity as none. Patient reports  vaginal bleeding as none. Patient describes fluid per vagina as None.  Vitals:  Blood pressure (!) 172/100, pulse 79, temperature 99.1 F (37.3 C), temperature source Oral, resp. rate 18, height 5' (1.524 m), weight (!) 172.5 kg, last menstrual period 08/31/2018, SpO2 98 %. Physical Examination:  General appearance - alert, well appearing, and in no distress Chest - normal effort Abdomen - gravid, non-tender Fundal Height:  size equals dates Extremities: Homans sign is negative, no sign of DVT  Membranes:intact  Fetal Monitoring:  Baseline: 140 bpm, Variability: Good {> 6 bpm), Accelerations: Non-reactive but appropriate for gestational age and Decelerations: Absent    Medications:  Scheduled . cloNIDine  0.3 mg Oral TID  . docusate sodium  100 mg Oral Daily  . enoxaparin (LOVENOX) injection  80 mg Subcutaneous Q24H  . hydrALAZINE  100 mg Oral Q8H  . labetalol  800 mg Oral TID  . NIFEdipine  60 mg Oral BID  . prenatal multivitamin  1 tablet Oral Q1200   I have reviewed the patient's current medications.  ASSESSMENT: Active Problems:   Chronic hypertension with superimposed preeclampsia   PLAN: BP is better controlled Continue anti-hypertensives No evidence of secondary hypertension Discussed long term plans with patient.  Danielle Jude, MD 02/26/2019,8:16 AM

## 2019-02-26 NOTE — Progress Notes (Signed)
Refused PRN IV hydralazine. States she had just gotten back from [place] when first BP was taken and was sitting up when second BP was taken. Wants BP rechecked again after lying down for a few minutes before receiving hydralazine.

## 2019-02-27 LAB — TYPE AND SCREEN
ABO/RH(D): O POS
Antibody Screen: NEGATIVE

## 2019-02-27 MED ORDER — HYDRALAZINE HCL 50 MG PO TABS
100.0000 mg | ORAL_TABLET | Freq: Once | ORAL | Status: AC
  Administered 2019-02-27: 100 mg via ORAL
  Filled 2019-02-27: qty 2

## 2019-02-27 MED ORDER — SODIUM CHLORIDE 0.9% FLUSH
3.0000 mL | Freq: Two times a day (BID) | INTRAVENOUS | Status: DC
  Administered 2019-02-27 – 2019-03-26 (×50): 3 mL via INTRAVENOUS
  Administered 2019-03-27: 20:00:00 10 mL via INTRAVENOUS
  Administered 2019-03-27 – 2019-04-02 (×11): 3 mL via INTRAVENOUS

## 2019-02-27 NOTE — Progress Notes (Signed)
After 5 BP readings in severe range because of patient's repeated requests to recheck BP before taking IV hydralazine, patient continues to refuse IV hydralazine. "That's just too much medicine to put into my body. You just gave me medicine for my BP not even an hour age."  Dr. Rip Harbour notified and new order received.

## 2019-02-27 NOTE — Progress Notes (Signed)
Declines PO hydralazine dose ordered by Dr. Rip Harbour. Requested that BP be rechecked at 1030.

## 2019-02-27 NOTE — Progress Notes (Signed)
Patient ID: Danielle Kent, female   DOB: August 18, 1986, 33 y.o.   MRN: [id number] Danielle Kent) NOTE  Danielle Kent is a 33 y.o. G2P1001 at [redacted]w[redacted]d by best clinical estimate who is admitted for Bp control.   Fetal presentation is unsure. Length of Stay:  4  Days  Subjective:  Patient reports the fetal movement as active. Patient reports uterine contraction  activity as none. Patient reports  vaginal bleeding as none. Patient describes fluid per vagina as None.  Vitals:  Blood pressure (!) 150/81, pulse 78, temperature 98.2 F (36.8 C), temperature source Oral, resp. rate 16, height 5' (1.524 m), weight (!) 172.5 kg, last menstrual period 08/31/2018, SpO2 98 %. Physical Examination:  General appearance - alert, well appearing, and in no distress Heart - normal rate and regular rhythm Abdomen - soft, nontender, nondistended Fundal Height:  size equals dates Cervical Exam: Not evaluated. Extremities: extremities normal, atraumatic, no cyanosis or edema and Homans sign is negative, no sign of DVT with DTRs 2+ bilaterally Membranes:intact  Fetal Monitoring:  Baseline: 145 bpm, Variability: Good {> 6 bpm), Accelerations: Reactive and Decelerations: Absent  Labs:  No results found for this or any previous visit (from the past 24 hour(s)).   Medications:  Scheduled . cloNIDine  0.3 mg Oral TID  . docusate sodium  100 mg Oral Daily  . enoxaparin (LOVENOX) injection  80 mg Subcutaneous Q24H  . hydrALAZINE  100 mg Oral Q8H  . labetalol  800 mg Oral TID  . NIFEdipine  60 mg Oral BID  . prenatal multivitamin  1 tablet Oral Q1200   I have reviewed the patient's current medications.  ASSESSMENT: Patient Active Problem List   Diagnosis Date Noted  . Severe hypertension   . Chronic hypertension with superimposed preeclampsia 02/23/2019  . Preexisting diabetes complicating pregnancy, antepartum 12/27/2018  . Previous cesarean section 12/06/2018  . Chronic hypertension  during pregnancy, antepartum 12/06/2018  . Maternal morbid obesity, antepartum ([place]) 12/06/2018  . Supervision of high risk pregnancy, antepartum 11/25/2018  . Hypertensive urgency 11/25/2018    PLAN: Continue current antihypertensives  Emeterio Reeve 02/27/2019,7:57 AM

## 2019-02-28 DIAGNOSIS — O10912 Unspecified pre-existing hypertension complicating pregnancy, second trimester: Secondary | ICD-10-CM

## 2019-02-28 LAB — CBC
HCT: 33.7 % — ABNORMAL LOW (ref 36.0–46.0)
Hemoglobin: 10.9 g/dL — ABNORMAL LOW (ref 12.0–15.0)
MCH: 28.1 pg (ref 26.0–34.0)
MCHC: 32.3 g/dL (ref 30.0–36.0)
MCV: 86.9 fL (ref 80.0–100.0)
Platelets: 295 10*3/uL (ref 150–400)
RBC: 3.88 MIL/uL (ref 3.87–5.11)
RDW: 14.4 % (ref 11.5–15.5)
WBC: 10.1 10*3/uL (ref 4.0–10.5)
nRBC: 0.3 % — ABNORMAL HIGH (ref 0.0–0.2)

## 2019-02-28 LAB — COMPREHENSIVE METABOLIC PANEL
ALT: 15 U/L (ref 0–44)
AST: 17 U/L (ref 15–41)
Albumin: 2.5 g/dL — ABNORMAL LOW (ref 3.5–5.0)
Alkaline Phosphatase: 62 U/L (ref 38–126)
Anion gap: 9 (ref 5–15)
BUN: 17 mg/dL (ref 6–20)
CO2: 22 mmol/L (ref 22–32)
Calcium: 9 mg/dL (ref 8.9–10.3)
Chloride: 104 mmol/L (ref 98–111)
Creatinine, Ser: 1.04 mg/dL — ABNORMAL HIGH (ref 0.44–1.00)
GFR calc Af Amer: >60 mL/min (ref >60)
GFR calc non Af Amer: >60 mL/min (ref >60)
Glucose, Bld: 123 mg/dL — ABNORMAL HIGH (ref 70–99)
Potassium: 3.9 mmol/L (ref 3.5–5.1)
Sodium: 135 mmol/L (ref 135–145)
Total Bilirubin: 0.5 mg/dL (ref 0.3–1.2)
Total Protein: 6.1 g/dL — ABNORMAL LOW (ref 6.5–8.1)

## 2019-02-28 LAB — PROTEIN / CREATININE RATIO, URINE
Creatinine, Urine: 61.63 mg/dL
Protein Creatinine Ratio: 0.16 mg/mg{Cre} — ABNORMAL HIGH (ref 0.00–0.15)
Total Protein, Urine: 10 mg/dL

## 2019-02-28 MED ORDER — LABETALOL HCL 200 MG PO TABS
800.0000 mg | ORAL_TABLET | Freq: Three times a day (TID) | ORAL | Status: DC
  Administered 2019-02-28 – 2019-04-04 (×101): 800 mg via ORAL
  Filled 2019-02-28 (×3): qty 4
  Filled 2019-02-28: qty 8
  Filled 2019-02-28 (×33): qty 4
  Filled 2019-02-28: qty 8
  Filled 2019-02-28 (×15): qty 4
  Filled 2019-02-28: qty 8
  Filled 2019-02-28 (×29): qty 4
  Filled 2019-02-28: qty 8
  Filled 2019-02-28 (×16): qty 4
  Filled 2019-02-28: qty 8
  Filled 2019-02-28 (×2): qty 4

## 2019-02-28 MED ORDER — CLONIDINE HCL 0.3 MG PO TABS
0.3000 mg | ORAL_TABLET | Freq: Three times a day (TID) | ORAL | Status: DC
  Administered 2019-02-28 – 2019-04-04 (×104): 0.3 mg via ORAL
  Filled 2019-02-28 (×105): qty 1

## 2019-02-28 NOTE — Consult Note (Signed)
[place] 02/28/2019    10:57 PM  Neonatal Medicine Consultation         Klaudia Beirne          MRN:  [id number]  I was called at the request of the patient's obstetrician (Dr. Rosana Hoes) to speak to this patient due to potential premature delivery at 27+ weeks due to hypertension.  The patient's prenatal course includes chronic and gestational hypertension, along with obesity, asthma, and diabetes.  She is 27 1/7 weeks currently.  She is admitted to [facility] unit, and is receiving treatment that includes betamethasone (5/6 and 5/7), magnesium sulfate, hydralazine.  The baby is a female.  I reviewed expectations for a baby born at 60+ weeks, including survival, length of stay, morbidities such as respiratory distress, IVH, infection, feeding intolerance, retinopathy.  I described how we provide respiratory and feeding support.  Mom plans to breast feed, which I encouraged as best for the baby (with supplementations for needed calories).  I let mom know that the baby's outlook generally improves the longer she remains undelivered.  I spent 30 minutes reviewing the record, speaking to the patient, and entering appropriate documentation.  More than 50% of the time was spent face to face with patient.   _____________________ Electronically Signed By: Roosevelt Locks, MD Neonatologist

## 2019-02-28 NOTE — Progress Notes (Signed)
BP recheck still in severe range parameters for IV hydralazine. Patient declined IV hydralazine,stated "You just gave me those BP meds half an hour ago; they haven't had time to work."

## 2019-02-28 NOTE — Plan of Care (Signed)
  Problem: Nutrition: Goal: Adequate nutrition will be maintained Outcome: Progressing   Problem: Pain Managment: Goal: General experience of comfort will improve Outcome: Progressing   

## 2019-02-28 NOTE — Progress Notes (Signed)
Pt has severe range BP's tonight. She is refusing to have her BP rechecked at this time. She states she is not going to take IV push meds and that these BP's are normal for her. She states that she will take her scheduled BP meds when they are due.

## 2019-02-28 NOTE — Progress Notes (Signed)
BP of 161/96 barely in severe range for PRN IV hydralazine. Patient declines PRN IV hydralazine for severe range BP at this time. " I don't want to drop my pressure too low. It makes me feel really bad."

## 2019-02-28 NOTE — Progress Notes (Signed)
FACULTY PRACTICE ANTEPARTUM PROGRESS NOTE  Danielle Kent is a 33 y.o. G2P1001 at [redacted]w[redacted]d who is admitted for superimposed pre-eclampsia on chronic hypertension.  Estimated Date of Delivery: 05/29/19 Fetal presentation is transverse last ultrasound.  Length of Stay:  5 Days. Admitted 02/23/2019  Subjective:  Patient reports normal fetal movement.  She denies uterine contractions, denies bleeding and leaking of fluid per vagina. Denies headache, overall, feeling well  Vitals:  Blood pressure (!) 163/100, pulse 79, temperature 98.7 F (37.1 C), temperature source Oral, resp. rate 20, height 5' (1.524 m), weight (!) 169.6 kg, last menstrual period 08/31/2018, SpO2 94 %. Physical Examination: CONSTITUTIONAL: Well-developed, well-nourished female in no acute distress.  HENT:  Normocephalic, atraumatic, External right and left ear normal. Oropharynx is clear and moist EYES: Conjunctivae and EOM are normal. Pupils are equal, round, and reactive to light. No scleral icterus.  NECK: Normal range of motion, supple, no masses. SKIN: Skin is warm and dry. No rash noted. Not diaphoretic. No erythema. No pallor. [place]: Alert and oriented to person, place, and time. Normal reflexes, muscle tone coordination. No cranial nerve deficit noted. PSYCHIATRIC: Normal mood and affect. Normal behavior. Normal judgment and thought content. CARDIOVASCULAR: Normal heart rate noted, regular rhythm RESPIRATORY: Effort normal, no problems with respiration noted MUSCULOSKELETAL: Normal range of motion. No edema and no tenderness. ABDOMEN: Soft, nontender, nondistended, gravid. CERVIX: deferred  Fetal monitoring: FHR: 140 bpm, Variability: moderate, Accelerations: Present, Decelerations: Absent  Uterine activity: no contractions per hour  Results for orders placed or performed during the hospital encounter of 02/23/19 (from the past 48 hour(s))  Type and screen [place]     Status: None   Collection  Time: 02/27/19  7:36 AM  Result Value Ref Range   ABO/RH(D) O POS    Antibody Screen NEG    Sample Expiration      03/02/2019,2359 Performed at [facility] [address] [place], [place] [address]   CBC     Status: Abnormal   Collection Time: 02/28/19 10:30 AM  Result Value Ref Range   WBC 10.1 4.0 - 10.5 K/uL   RBC 3.88 3.87 - 5.11 MIL/uL   Hemoglobin 10.9 (L) 12.0 - 15.0 g/dL   HCT 33.7 (L) 36.0 - 46.0 %   MCV 86.9 80.0 - 100.0 fL   MCH 28.1 26.0 - 34.0 pg   MCHC 32.3 30.0 - 36.0 g/dL   RDW 14.4 11.5 - 15.5 %   Platelets 295 150 - 400 K/uL   nRBC 0.3 (H) 0.0 - 0.2 %    Comment: Performed at [facility], [place] [address] [place], [place] [address]  Comprehensive metabolic panel     Status: Abnormal   Collection Time: 02/28/19 10:30 AM  Result Value Ref Range   Sodium 135 135 - 145 mmol/L   Potassium 3.9 3.5 - 5.1 mmol/L   Chloride 104 98 - 111 mmol/L   CO2 22 22 - 32 mmol/L   Glucose, Bld 123 (H) 70 - 99 mg/dL   BUN 17 6 - 20 mg/dL   Creatinine, Ser 1.04 (H) 0.44 - 1.00 mg/dL   Calcium 9.0 8.9 - 10.3 mg/dL   Total Protein 6.1 (L) 6.5 - 8.1 g/dL   Albumin 2.5 (L) 3.5 - 5.0 g/dL   AST 17 15 - 41 U/L   ALT 15 0 - 44 U/L   Alkaline Phosphatase 62 38 - 126 U/L   Total Bilirubin 0.5 0.3 - 1.2 mg/dL  GFR calc non Af Amer >60 >60 mL/min   GFR calc Af Amer >60 >60 mL/min   Anion gap 9 5 - 15    Comment: Performed at [facility] [address] [place], [place] [address]    I have reviewed the patient's current medications.  ASSESSMENT: Active Problems:   Previous cesarean section   Chronic hypertension during pregnancy, antepartum   Maternal morbid obesity, antepartum ([place])   Preexisting diabetes complicating pregnancy, antepartum   Chronic hypertension with superimposed preeclampsia   Severe hypertension   PLAN: - have adjusted timing of meds per cardiology note from 02/04/19 to stagger administration - reviewed mode of delivery with  patient, reviewed risks/benefits of TOLAC vs RCS, that this is pending fetal lie (last [place] was transverse and she will need c-section if delivery indicated and fetus non-cephalic), patient verbalizes understanding of the above - s/p 48 hrs Mag - s/p BTMZ 5/6-7 - will f/u to ensure she has NICU consult - repeat CBC/CMP stable - waiting repeat UPCr - will see if BP improves on staggered dosing, if not, will consider delivery - with elevated A1C, will get fasting glucose daily    Continue routine antenatal care.   Feliz Beam, M.D. Attending [facility] [employer] (Faculty Practice)  02/28/2019 1:14 PM

## 2019-03-01 ENCOUNTER — Encounter (HOSPITAL_COMMUNITY): Payer: Self-pay

## 2019-03-01 ENCOUNTER — Ambulatory Visit (HOSPITAL_COMMUNITY): Payer: Medicaid Other

## 2019-03-01 ENCOUNTER — Encounter (HOSPITAL_COMMUNITY): Payer: Self-pay | Admitting: Physician Assistant

## 2019-03-01 DIAGNOSIS — O10919 Unspecified pre-existing hypertension complicating pregnancy, unspecified trimester: Secondary | ICD-10-CM

## 2019-03-01 LAB — GLUCOSE, CAPILLARY: Glucose-Capillary: 80 mg/dL (ref 70–99)

## 2019-03-01 LAB — URINALYSIS, ROUTINE W REFLEX MICROSCOPIC
Bilirubin Urine: NEGATIVE
Glucose, UA: NEGATIVE mg/dL
Hgb urine dipstick: NEGATIVE
Ketones, ur: 5 mg/dL — AB
Leukocytes,Ua: NEGATIVE
Nitrite: NEGATIVE
Protein, ur: 30 mg/dL — AB
Specific Gravity, Urine: 1.021 (ref 1.005–1.030)
pH: 5 (ref 5.0–8.0)

## 2019-03-01 LAB — PROTEIN / CREATININE RATIO, URINE
Creatinine, Urine: 174.7 mg/dL
Protein Creatinine Ratio: 0.18 mg/mg{Cre} — ABNORMAL HIGH (ref 0.00–0.15)
Total Protein, Urine: 32 mg/dL

## 2019-03-01 MED ORDER — ENOXAPARIN SODIUM 80 MG/0.8ML ~~LOC~~ SOLN
80.0000 mg | SUBCUTANEOUS | Status: DC
  Administered 2019-03-02 – 2019-03-19 (×18): 80 mg via SUBCUTANEOUS
  Filled 2019-03-01 (×19): qty 0.8

## 2019-03-01 MED ORDER — HYDRALAZINE HCL 50 MG PO TABS
100.0000 mg | ORAL_TABLET | Freq: Four times a day (QID) | ORAL | Status: DC
  Administered 2019-03-01 – 2019-04-03 (×133): 100 mg via ORAL
  Filled 2019-03-01 (×138): qty 2

## 2019-03-01 NOTE — Progress Notes (Signed)
FACULTY PRACTICE ANTEPARTUM PROGRESS NOTE  Danielle Kent is a 33 y.o. G2P1001 at [redacted]w[redacted]d who is admitted for superimposed severe preeclampsia on chronic HTN.  Estimated Date of Delivery: 05/29/19 Fetal presentation is unsure.  Length of Stay:  6 Days. Admitted 02/23/2019  Subjective: Patient reports normal fetal movement.  She denies uterine contractions, denies bleeding and leaking of fluid per vagina. Denies headache, blurred vision. Feeling well but sleepy this am.   Vitals:  Blood pressure (!) 166/92, pulse 80, temperature 98.8 F (37.1 C), temperature source Oral, resp. rate 18, height 5' (1.524 m), weight (!) 169.6 kg, last menstrual period 08/31/2018, SpO2 98 %. Physical Examination: CONSTITUTIONAL: Well-developed, well-nourished female in no acute distress. Morbidly obese.  HENT:  Normocephalic, atraumatic, External right and left ear normal. Oropharynx is clear and moist EYES: Conjunctivae and EOM are normal. Pupils are equal, round, and reactive to light. No scleral icterus.  NECK: Normal range of motion, supple, no masses. SKIN: Skin is warm and dry. No rash noted. Not diaphoretic. No erythema. No pallor. [place]: Alert and oriented to person, place, and time. Normal reflexes, muscle tone coordination. No cranial nerve deficit noted. PSYCHIATRIC: Normal mood and affect. Normal behavior. Normal judgment and thought content. CARDIOVASCULAR: Normal heart rate noted RESPIRATORY: Effort normal, no problems with respiration noted MUSCULOSKELETAL: Normal range of motion. Mild bilateral lower extremity edema and no tenderness. ABDOMEN: Soft, nontender, nondistended, gravid. Large pannus. CERVIX: deferred  Fetal monitoring: FHR: 140 bpm, Variability: moderate, Accelerations: not Present, Decelerations: Absent  Uterine activity: no contractions per hour  I have reviewed the patient's current medications.  ASSESSMENT: Active Problems:   Previous cesarean section   Chronic hypertension  during pregnancy, antepartum   Maternal morbid obesity, antepartum ([place])   Preexisting diabetes complicating pregnancy, antepartum   Chronic hypertension with superimposed preeclampsia   Severe hypertension   PLAN: Had extended discussion with patient today regarding timing of delivery. Reviewed that her BP is not optimally controlled and she remains at risk of cardiovascular events such as stroke with elevated BP, as well as adverse pregnancy related events, such as placental abruption. Reviewed that she may need urgent c-section/delivery. Reviewed that we are weighing risks to her of delaying delivery versus prematurity in infant; patient verbalizes understanding and voices she will do whatever we recommend.  - timing of BP meds adjusted yesterday to stagger - cont current regimen: procardia BID, labetalol TID, clonidine TID, hydralazine TID - reviewed BP ranges with MFM, who recommend cardiology and nephrology consult - will need repeat c-section if remains non-cephalic - s/p 48 hrs Mag - s/p BTMZ 5/6-7 - s/p NICU consult - fasting glucose daily for elevated A1C - cardiology and nephrology consult  Continue routine antenatal care.   Feliz Beam, M.D. [facility]ending [facility] [employer] [facility] (Faculty Practice)  03/01/2019 11:02 AM

## 2019-03-01 NOTE — Consult Note (Addendum)
Cardiology Consultation:   Patient ID: Danielle Kent; [id number]; 20-Dec-1985   Admit date: 02/23/2019 Date of Consult: 03/01/2019  Primary Care Provider: Patient, No Pcp Per Primary Cardiologist: No primary care provider on file. Dr Johnsie Cancel saw for CP in-hospital 11/26/2018 Dr Debara Pickett had a phone visit for  HTN on 02/17/2019 Primary Electrophysiologist:  None   Patient Profile:   Danielle Kent is a 33 y.o. female with a hx of HTN, morbid obesity, who is being seen today for the evaluation of HTN at the request of Dr Rosana Hoes.  History of Present Illness:   [name] was seen by phone by  Dr Debara Pickett 04/30. He ordered a cuff thru CHF clinic to be delivered to her home. She was to get bid readings and f/u in 2 weeks. She was to stagger the short-acting meds to minimize the trough between meds.  She was sent to [facility] 05/06 due to severely elevated BP, trending up on her home cuff. She was having some headaches. 209/127 in the hospital. She was felt to have preeclampsia superimposed on uncontrolled HTN. She was started on Mag, betamethasone and prn IV meds.   Seen in consult by Dr Donalee Citrin, who recommended increasing antihypertensives. Delivery, preferably after steroids completed, if BP cannot be controlled. Pt had NICU consult 05/11 in case delivery is needed, baby is currently doing well.   Steroids and Mag have been completed.  Pt seen today by Dr Rosana Hoes and Cards/Nephrology consults recommended since BP still not well-controlled.  124/68 lowest, that was at 9:30 today 186/104, 196/111 and multiple other similar values are noted, more often in the evening  227/139 highest BP on admit  [name] was first diagnosed with high blood pressure 8 or 9 years ago, before the birth of her daughter who is 8.  She was initially placed on lisinopril by her PCP.  However, when she came off Medicaid, she did not have the money to afford doctor visits or medicine, was not on medicine for a long time.  Her OB/GYN that she  was seen for her pregnancy noticed her hypertension and started medication.  She is compliant with her medication, but did not have a blood pressure cuff until very recently.  When she got the blood pressure cuff and started checking her blood pressure as directed, that was when the severity of her hypertension was determined and she was admitted.  She has a history of chest pain, would get it multiple times a week.  It was exertional, and would go away when she sat down and rested.  She has been having chest pain like this for over a year, no recent change.  She was evaluated 11/2018 when in the hospital, no further work-up was needed.  She has not had chest pain since being in the hospital, it is possibly related to hypertension.  She had some lower extremity edema prior to admission, but denies orthopnea or PND.  Swelling was worse on her left side, but she was sleeping on her left side.  Since being in the hospital, she was put on squeezers, and the lower extremity edema has resolved.  She has had occasional headaches for years, has not had any headaches since being in the hospital.  Since she only recently got a blood pressure cuff, was not able to correlate her head aches with uncontrolled hypertension.  She has not had problems with dizziness, unilateral weakness or vision problems.   Past Medical History:  Diagnosis Date  . Asthma   .  Gestational diabetes 2020  . Hypertension     Past Surgical History:  Procedure Laterality Date  . CESAREAN SECTION       Prior to Admission medications   Medication Sig Start Date End Date Taking? Authorizing Provider  Accu-Chek FastClix Lancets MISC 1 Units by Percutaneous route 4 (four) times daily. 12/27/18  Yes Constant, Peggy, MD  aspirin EC 81 MG tablet Take 1 tablet (81 mg total) by mouth daily. Take after 12 weeks for prevention of preeclampsia later in pregnancy 12/06/18  Yes Constant, Peggy, MD  Blood Glucose Monitoring Suppl (ACCU-CHEK NANO  SMARTVIEW) w/Device KIT 1 kit by Subdermal route as directed. Check blood sugars for fasting, and two hours after breakfast, lunch and dinner (4 checks daily) 12/27/18  Yes Constant, Peggy, MD  cloNIDine (CATAPRES) 0.2 MG tablet Take 1 tablet (0.2 mg total) by mouth 3 (three) times daily. 01/21/19  Yes Marcille Buffy D, CNM  glucose blood (ACCU-CHEK SMARTVIEW) test strip Use as instructed to check blood sugars 12/27/18  Yes Constant, Peggy, MD  hydrALAZINE (APRESOLINE) 100 MG tablet Take 1 tablet (100 mg total) by mouth 3 (three) times daily. 01/21/19  Yes Marcille Buffy D, CNM  labetalol (NORMODYNE) 200 MG tablet TAKE 1 TABLET BY MOUTH THREE TIMES DAILY Patient taking differently: 400 mg 3 (three) times daily.  02/16/19  Yes Marcille Buffy D, CNM  NIFEdipine (PROCARDIA XL) 60 MG 24 hr tablet Take 1 tablet (60 mg total) by mouth 2 (two) times daily. 01/17/19 03/18/19 Yes Nugent, Gerrie Nordmann, NP  Prenatal Vit-Fe Fumarate-FA (PRENATAL VITAMIN) 27-0.8 MG TABS Take 1 tablet by mouth daily. 10/29/18  Yes Charlann Lange, PA-C    Inpatient Medications: Scheduled Meds: . cloNIDine  0.3 mg Oral TID  . docusate sodium  100 mg Oral Daily  . enoxaparin (LOVENOX) injection  80 mg Subcutaneous Q24H  . hydrALAZINE  100 mg Oral Q8H  . labetalol  800 mg Oral Q8H  . NIFEdipine  60 mg Oral BID  . prenatal multivitamin  1 tablet Oral Q1200  . sodium chloride flush  3 mL Intravenous Q12H   Continuous Infusions:  PRN Meds: acetaminophen, calcium carbonate, hydrALAZINE **AND** hydrALAZINE **AND** labetalol **AND** labetalol **AND** Measure blood pressure, zolpidem  Allergies:   No Known Allergies  Social History:   Social History   Socioeconomic History  . Marital status: Single    Spouse name: Not on file  . Number of children: Not on file  . Years of education: Not on file  . Highest education level: Not on file  Occupational History  . Not on file  Social Needs  . Financial resource strain: Not on file  . Food  insecurity:    Worry: Not on file    Inability: Not on file  . Transportation needs:    Medical: Not on file    Non-medical: Not on file  Tobacco Use  . Smoking status: Never Smoker  . Smokeless tobacco: Never Used  Substance and Sexual Activity  . Alcohol use: Not Currently  . Drug use: Not Currently  . Sexual activity: Yes  Lifestyle  . Physical activity:    Days per week: Not on file    Minutes per session: Not on file  . Stress: Not on file  Relationships  . Social connections:    Talks on phone: Not on file    Gets together: Not on file    Attends religious service: Not on file    Active member of club or  organization: Not on file    Attends meetings of clubs or organizations: Not on file    Relationship status: Not on file  . Intimate partner violence:    Fear of current or ex partner: Not on file    Emotionally abused: Not on file    Physically abused: Not on file    Forced sexual activity: Not on file  Other Topics Concern  . Not on file  Social History Narrative  . Not on file    Family History:   Family History  Problem Relation Age of Onset  . Diabetes Mother   . Hypertension Mother   . Multiple sclerosis Mother   . Asthma Father   . Heart attack Maternal Grandfather 28   Family Status:  Family Status  Relation Name Status  . Mother  Alive  . Father  Alive  . MGF  Deceased    ROS:  Please see the history of present illness.  All other ROS reviewed and negative.     Physical Exam/Data:   Vitals:   03/01/19 0936 03/01/19 1015 03/01/19 1033 03/01/19 1200  BP: 124/68 (!) 166/89 (!) 166/92 (!) 155/91  Pulse: 80 77 80 73  Resp:    20  Temp:    98.7 F (37.1 C)  TempSrc:    Oral  SpO2:    100%  Weight:      Height:        Intake/Output Summary (Last 24 hours) at 03/01/2019 1217 Last data filed at 03/01/2019 0930 Gross per 24 hour  Intake 3 ml  Output -  Net 3 ml   Filed Weights   02/24/19 0123 02/27/19 0855  Weight: (!) 172.5 kg (!)  169.6 kg   Body mass index is 73.04 kg/m.  General:  Well nourished, morbidly obese female, in no acute distress HEENT: normal Lymph: no adenopathy Neck: no JVD Endocrine:  No thryomegaly Vascular: No carotid bruits; 4/4 extremity pulses 2+, without bruits  Cardiac:  normal S1, S2; RRR; no murmur  Lungs:  clear to auscultation bilaterally, no wheezing, rhonchi or rales  Abd: soft, nontender, no hepatomegaly  Ext: no edema Musculoskeletal:  No deformities, BUE and BLE strength normal and equal Skin: warm and dry  Neuro:  CNs 2-12 intact, no focal abnormalities noted Psych:  Normal affect   EKG:  The EKG was personally reviewed and demonstrates:  04/03 ECG is SR, HR 99, no acute ischemic changes, normal intervals  Telemetry:  Telemetry was personally reviewed and demonstrates:  Not on  Relevant CV Studies:  ECHO: 11/26/2018  1. The left ventricle has normal systolic function of 50-35%. The cavity size was normal. There is no increased left ventricular wall thickness. Echo evidence of pseudonormalization in diastolic relaxation Elevated left ventricular end-diastolic  pressure.  2. The right ventricle has normal systolic function. The cavity was normal. There is no increase in right ventricular wall thickness. Right ventricular systolic pressure could not be assessed.  3. Left atrial size was mildly dilated.  4. The mitral valve is normal in structure.  5. The tricuspid valve is normal in structure.  6. The aortic valve is normal in structure.  7. The pulmonic valve was normal in structure.  Laboratory Data:  Chemistry Recent Labs  Lab 02/23/19 1740 02/25/19 0715 02/28/19 1030  NA 134* 135 135  K 4.8 4.8 3.9  CL 100 100 104  CO2 20* 24 22  GLUCOSE 111* 136* 123*  BUN 15 20 17   CREATININE 1.15*  1.24* 1.04*  CALCIUM 9.8 8.5* 9.0  GFRNONAA >60 57* >60  GFRAA >60 >60 >60  ANIONGAP 14 11 9     Lab Results  Component Value Date   ALT 15 02/28/2019   AST 17 02/28/2019    ALKPHOS 62 02/28/2019   BILITOT 0.5 02/28/2019   Hematology Recent Labs  Lab 02/23/19 1740 02/25/19 0715 02/28/19 1030  WBC 12.1* 13.0* 10.1  RBC 4.38 3.93 3.88  HGB 12.4 11.2* 10.9*  HCT 37.7 33.8* 33.7*  MCV 86.1 86.0 86.9  MCH 28.3 28.5 28.1  MCHC 32.9 33.1 32.3  RDW 14.2 14.6 14.4  PLT 338 323 295   TSH:  Lab Results  Component Value Date   TSH 2.580 12/06/2018   HgbA1c: Lab Results  Component Value Date   HGBA1C 5.9 (H) 02/02/2019   Magnesium:  Magnesium  Date Value Ref Range Status  11/25/2018 1.8 1.7 - 2.4 mg/dL Final    Comment:    Performed at [facility], [employer] [address], [place], [place] [address]   Urinalysis    Component Value Date/Time   COLORURINE YELLOW 02/23/2019 1721   APPEARANCEUR CLOUDY (A) 02/23/2019 1721   LABSPEC 1.011 02/23/2019 1721   PHURINE 6.0 02/23/2019 1721   GLUCOSEU NEGATIVE 02/23/2019 1721   HGBUR NEGATIVE 02/23/2019 1721   BILIRUBINUR NEGATIVE 02/23/2019 1721   KETONESUR NEGATIVE 02/23/2019 1721   PROTEINUR 30 (A) 02/23/2019 1721   NITRITE NEGATIVE 02/23/2019 1721   LEUKOCYTESUR NEGATIVE 02/23/2019 1721       Radiology/Studies:  No results found.  Assessment and Plan:   1. HTN:  - Feel most of this is her underlying severe HTN - Lifestyle modifications and wt loss are long term goals.  - She is on max doses of procardia and labetalol - Increase hydralazine to 100 mg q 6 hr and see if this helps - Feel that short-term goal of BP approx. 160/100 is ok, she is near this much of the time - has had some LE edema, could also give some HCTZ, 1-2 doses to see if this helps her diastolic BP - no LVH on echo and EF normal so no further cardiac eval needed - she has not been on methyldopa  Otherwise, per Dr Rosana Hoes, Nephrology Active Problems:   Previous cesarean section   Chronic hypertension during pregnancy, antepartum   Maternal morbid obesity, antepartum ([place])   Preexisting diabetes complicating  pregnancy, antepartum   Chronic hypertension with superimposed preeclampsia   Severe hypertension   For questions or updates, please contact [facility] Please consult [email or web address] for contact info under Cardiology/STEMI.   Signed, Rosaria Ferries, PA-C  03/01/2019 12:17 PM   Patient examined chart reviewed. Compliant with meds Schedule for taking recently changed by Dr Debara Pickett. Obese diabetic with DM. Long standing HTN. No signs of eclampsia with no headache, no nausea, no new edema and normal Cr Protien/Cr ratio upper limit of normal. Currently on 4 drug Rx with escalated dose of labetalol. Echo in February with no LVH although to my eye moderate and normal EF. ECG normal no strain. Not much for cardiology to add. Symptoms and Protein/ Urine ratio do not see to support the need for iv Rx. Would use iv nitroglycerin or nicardipine if needed but she appears stable at this time. Given fetal age 26 weeks and female gender would try to avoid delivery  Exam with obese black female S4 gallop clear lungs protuberant abdomen no tenderness trace edema normal neuro exam. Has had Mg  for seizure prophylaxis   Jenkins Rouge

## 2019-03-01 NOTE — Progress Notes (Signed)
Pt's 15 minute BP recheck after IV 20mg  labetalol is 164/98. Pt states she does not want anymore IV BP med at this time. Pt wants BP rechecked at 0930 to determine if she needs IV BP meds.

## 2019-03-01 NOTE — Consult Note (Addendum)
[place]  Reason for Consultation: hypertension Requesting Provider: Dr Vivien Rota  HPI: Danielle Kent is an 33 y.o. AAF currently 27wks1d gestation who is seen for hypertension.    She was diagnosed with hypertension 8-9y ago, treated with lisinopril initially but due to lack of insurance was untreated for several years.  During this pregnancy she was noted to be hypertensive.  Medications have been added and titrated through OB and cardiology during the pregnancy.  However on 02/23/19 she was noted to have severe HTN at outpt OB visit and admitted for presumed preeclampsia on top of cHTN. On presentation to hospital BP 200/137.  Medications have been titrated to clonidine 0.3 TID, hydralazine 100 TID, labetalol 800 TID, nifedipine 60 BID.  She's been treated with IV doses of hydralazine 1-2/day during admission + this am IV labetalol.   Also IV mag, betamethasone for baby.  NICU consult in case delivery needed.  Bps in past 24 hrs range 124/68 (9am today) to 182/94 and appear to generally be in the 160-170s.  She had 1+ protein on 5/6 dipstick UP/C 4/15 was 0.162.   Cr 1-1.2 during admission.  Plt normal.   When seen today she reports overall feeling ok.  No HA.  Had some dependent ankle edema when admitted but resolved with SCDs and elevation.  No other symptoms.   PMH: Past Medical History:  Diagnosis Date  . Asthma   . Gestational diabetes 2020  . Hypertension    PSH: Past Surgical History:  Procedure Laterality Date  . CESAREAN SECTION      Past Medical History:  Diagnosis Date  . Asthma   . Gestational diabetes 2020  . Hypertension     Medications:  I have reviewed the patient's current medications.  Medications Prior to Admission  Medication Sig Dispense Refill  . Accu-Chek FastClix Lancets MISC 1 Units by Percutaneous route 4 (four) times daily. 100 each 12  . aspirin EC 81 MG tablet Take 1 tablet (81 mg total) by mouth daily. Take  after 12 weeks for prevention of preeclampsia later in pregnancy 300 tablet 2  . Blood Glucose Monitoring Suppl (ACCU-CHEK NANO SMARTVIEW) w/Device KIT 1 kit by Subdermal route as directed. Check blood sugars for fasting, and two hours after breakfast, lunch and dinner (4 checks daily) 1 kit 0  . cloNIDine (CATAPRES) 0.2 MG tablet Take 1 tablet (0.2 mg total) by mouth 3 (three) times daily. 90 tablet 1  . glucose blood (ACCU-CHEK SMARTVIEW) test strip Use as instructed to check blood sugars 100 each 12  . hydrALAZINE (APRESOLINE) 100 MG tablet Take 1 tablet (100 mg total) by mouth 3 (three) times daily. 90 tablet 1  . labetalol (NORMODYNE) 200 MG tablet TAKE 1 TABLET BY MOUTH THREE TIMES DAILY (Patient taking differently: 400 mg 3 (three) times daily. ) 270 tablet 0  . NIFEdipine (PROCARDIA XL) 60 MG 24 hr tablet Take 1 tablet (60 mg total) by mouth 2 (two) times daily. 60 tablet 1  . Prenatal Vit-Fe Fumarate-FA (PRENATAL VITAMIN) 27-0.8 MG TABS Take 1 tablet by mouth daily. 30 tablet 0    ALLERGIES:  No Known Allergies  FAM HX: Family History  Problem Relation Age of Onset  . Diabetes Mother   . Hypertension Mother   . Multiple sclerosis Mother   . Asthma Father   . Heart attack Maternal Grandfather 57    Social History:   reports that she has never smoked. She has never used  smokeless tobacco. She reports previous alcohol use. She reports previous drug use.  ROS: 12 system ROS per HPI above  Blood pressure (!) 166/92, pulse 80, temperature 98.8 F (37.1 C), temperature source Oral, resp. rate 18, height 5' (1.524 m), weight (!) 169.6 kg, last menstrual period 08/31/2018, SpO2 98 %. PHYSICAL EXAM: Gen: obese woman lying flat in bed  Eyes:  anicteric ENT: MMM Neck: supple CV:  RRR Abd: gravid and obese Back: no CVA TTP GU: no foley Extr:  Trace pedal edema Neuro: nonfocal Skin: no rashes   Results for orders placed or performed during the hospital encounter of 02/23/19  (from the past 48 hour(s))  Protein / creatinine ratio, urine     Status: Abnormal   Collection Time: 02/28/19 10:15 AM  Result Value Ref Range   Creatinine, Urine 61.63 mg/dL   Total Protein, Urine 10 mg/dL    Comment: NO NORMAL RANGE ESTABLISHED FOR THIS TEST   Protein Creatinine Ratio 0.16 (H) 0.00 - 0.15 mg/mg[Cre]    Comment: Performed at [facility], [address], [place], [place] [address]  CBC     Status: Abnormal   Collection Time: 02/28/19 10:30 AM  Result Value Ref Range   WBC 10.1 4.0 - 10.5 K/uL   RBC 3.88 3.87 - 5.11 MIL/uL   Hemoglobin 10.9 (L) 12.0 - 15.0 g/dL   HCT 33.7 (L) 36.0 - 46.0 %   MCV 86.9 80.0 - 100.0 fL   MCH 28.1 26.0 - 34.0 pg   MCHC 32.3 30.0 - 36.0 g/dL   RDW 14.4 11.5 - 15.5 %   Platelets 295 150 - 400 K/uL   nRBC 0.3 (H) 0.0 - 0.2 %    Comment: Performed at [facility], [place] [address] [place], [place] [address]  Comprehensive metabolic panel     Status: Abnormal   Collection Time: 02/28/19 10:30 AM  Result Value Ref Range   Sodium 135 135 - 145 mmol/L   Potassium 3.9 3.5 - 5.1 mmol/L   Chloride 104 98 - 111 mmol/L   CO2 22 22 - 32 mmol/L   Glucose, Bld 123 (H) 70 - 99 mg/dL   BUN 17 6 - 20 mg/dL   Creatinine, Ser 1.04 (H) 0.44 - 1.00 mg/dL   Calcium 9.0 8.9 - 10.3 mg/dL   Total Protein 6.1 (L) 6.5 - 8.1 g/dL   Albumin 2.5 (L) 3.5 - 5.0 g/dL   AST 17 15 - 41 U/L   ALT 15 0 - 44 U/L   Alkaline Phosphatase 62 38 - 126 U/L   Total Bilirubin 0.5 0.3 - 1.2 mg/dL   GFR calc non Af Amer >60 >60 mL/min   GFR calc Af Amer >60 >60 mL/min   Anion gap 9 5 - 15    Comment: Performed at [facility] [address], [place], [place] [address]  Glucose, capillary     Status: None   Collection Time: 03/01/19  7:11 AM  Result Value Ref Range   Glucose-Capillary 80 70 - 99 mg/dL    No results found.  Assessment/Plan: 1F with long standing hypertension who is currently admitted with worsening HTN at [redacted]wks gestation.   Concern  for developing preeclampsia.  Labs look good, UA a few days ago without proteinuria; did have low level proteinuria 1 month ago.   She's on max doses of 4 oral agents already but BPs remain markedly elevated.  The only other oral option I see  at to add would be methyldopa.  I have discussed with OB MD who will consider option along with C/s from cardiology and MFM.  Cardiology has given recommendations for IV therapies that could be considered if she clinically worsens.    I have ordered UA and UP/C to reevaluate for proteinuria.   I am not sure that I have much to add at this point, but will follow along peripherally in case there are other issues I can help address.  Please call me with any specific questions.   Justin Mend 03/01/2019, 11:32 AM  Reviewed UA and UP/C - minor proteinuria.  Per chart review considering adding low dose diuretic and pt may discharge home soon.  Please call me with anything I can help with. Vidal Schwalbe

## 2019-03-02 LAB — GLUCOSE, CAPILLARY: Glucose-Capillary: 87 mg/dL (ref 70–99)

## 2019-03-02 MED ORDER — ISOSORBIDE DINITRATE 30 MG PO TABS: 30.0000 mg | ORAL_TABLET | Freq: Two times a day (BID) | ORAL | Status: DC

## 2019-03-02 MED ORDER — HYDROCHLOROTHIAZIDE 12.5 MG PO CAPS
12.5000 mg | ORAL_CAPSULE | Freq: Every day | ORAL | Status: DC
  Administered 2019-03-02: 12.5 mg via ORAL
  Filled 2019-03-02: qty 1

## 2019-03-02 NOTE — Progress Notes (Signed)
FACULTY PRACTICE ANTEPARTUM PROGRESS NOTE  Danielle Kent is a 33 y.o. G2P1001 at [redacted]w[redacted]d who is admitted for chronic hypertension with superimposed preeclampsia.  Estimated Date of Delivery: 05/29/19 Fetal presentation is unsure.  Length of Stay:  7 Days. Admitted 02/23/2019  Subjective: Patient feeling well, no complaints today. Patient reports normal fetal movement.  She denies uterine contractions, denies bleeding and leaking of fluid per vagina.  Vitals:  Blood pressure (!) 142/70, pulse 79, temperature 98.5 F (36.9 C), temperature source Oral, resp. rate 18, height 5' (1.524 m), weight (!) 169.6 kg, last menstrual period 08/31/2018, SpO2 98 %. Physical Examination: CONSTITUTIONAL: Well-developed, well-nourished female in no acute distress. Morbidly obese HENT:  Normocephalic, atraumatic, External right and left ear normal. Oropharynx is clear and moist EYES: Conjunctivae and EOM are normal. Pupils are equal, round, and reactive to light. No scleral icterus.  NECK: Normal range of motion, supple, no masses. SKIN: Skin is warm and dry. No rash noted. Not diaphoretic. No erythema. No pallor. [place]: Alert and oriented to person, place, and time. Normal reflexes, muscle tone coordination. No cranial nerve deficit noted. PSYCHIATRIC: Normal mood and affect. Normal behavior. Normal judgment and thought content. CARDIOVASCULAR: Normal heart rate noted RESPIRATORY: Effort normal, no problems with respiration noted MUSCULOSKELETAL: Normal range of motion. No edema and no tenderness. ABDOMEN: Soft, nontender, nondistended, gravid. CERVIX: deferred  Fetal monitoring: FHR: 140 bpm, Variability: minimal to moderate, Accelerations: not Present, Decelerations: Absent  Uterine activity: no contractions per hour   I have reviewed the patient's current medications.  ASSESSMENT: Active Problems:   Previous cesarean section   Chronic hypertension during pregnancy, antepartum   Maternal morbid  obesity, antepartum ([place])   Preexisting diabetes complicating pregnancy, antepartum   Chronic hypertension with superimposed preeclampsia   Severe hypertension   PLAN:  - BP improved today, had considered adding HCTZ however with decreased BP today (goal 150/90s for fetal status), will hold on HCTZ, appreciate cardiology recommendations, will hold nitroglycerin as well given improvement in BP but consider adding back as needed - will attempt to wean clonidine as possible - s/p 48 hr mag - s/p BTMZ - s/p NICU consult - fasting glucose daily - appreciate cardiology and nephrology recommendations   Continue routine antenatal care, patient aware she will be in patient until delivery.   Feliz Beam, M.D. Attending [facility] [employer] (Faculty Practice)  03/02/2019 12:48 PM

## 2019-03-02 NOTE — Progress Notes (Signed)
This note also relates to the following rows which could not be included: SpO2 - Cannot attach notes to unvalidated device data    03/02/19 2315  Vital Signs  BP (!) 177/111  Pulse Rate 85  Pt's Bp as stated above, refused second dose of labetalol 40mg  at this time. Will notify MD

## 2019-03-02 NOTE — Progress Notes (Signed)
Progress Note  Patient Name: Felicite Zeimet Date of Encounter: 03/02/2019  Primary Cardiologist:  Hilty  Subjective   Feels fine BP high but better this am She has Berniece Salines on her Breakfast plate !!!  Inpatient Medications    Scheduled Meds: . cloNIDine  0.3 mg Oral TID  . docusate sodium  100 mg Oral Daily  . enoxaparin (LOVENOX) injection  80 mg Subcutaneous Q24H  . hydrALAZINE  100 mg Oral Q6H  . hydrochlorothiazide  12.5 mg Oral Daily  . labetalol  800 mg Oral Q8H  . NIFEdipine  60 mg Oral BID  . prenatal multivitamin  1 tablet Oral Q1200  . sodium chloride flush  3 mL Intravenous Q12H   Continuous Infusions:  PRN Meds: acetaminophen, calcium carbonate, hydrALAZINE **AND** hydrALAZINE **AND** labetalol **AND** labetalol **AND** Measure blood pressure, zolpidem   Vital Signs    Vitals:   03/02/19 0441 03/02/19 0500 03/02/19 0630 03/02/19 0742  BP: (!) 173/92  (!) 165/91 (!) 163/101  Pulse: 79  75 73  Resp:   18 16  Temp:   98.7 F (37.1 C) 98.4 F (36.9 C)  TempSrc:   Oral Oral  SpO2:  98% 98% 98%  Weight:      Height:        Intake/Output Summary (Last 24 hours) at 03/02/2019 2878 Last data filed at 03/01/2019 0930 Gross per 24 hour  Intake 3 ml  Output -  Net 3 ml   Last 3 Weights 02/27/2019 02/24/2019 02/23/2019  Weight (lbs) 374 lb 380 lb 4.7 oz 380 lb 6.4 oz  Weight (kg) 169.645 kg 172.5 kg 172.548 kg      Telemetry     ECG    SR normal no LVH or strain  - Personally Reviewed  Physical Exam  Obese black female  GEN: No acute distress.   Neck: No JVD Cardiac: RRR, no murmurs, rubs, or gallops.  Respiratory: Clear to auscultation bilaterally. GI: Soft, nontender, non-distende3d  MS: No edema; No deformity. Neuro:  Nonfocal  Psych: Normal affect   Labs    Chemistry Recent Labs  Lab 02/23/19 1740 02/25/19 0715 02/28/19 1030  NA 134* 135 135  K 4.8 4.8 3.9  CL 100 100 104  CO2 20* 24 22  GLUCOSE 111* 136* 123*  BUN 15 20 17   CREATININE  1.15* 1.24* 1.04*  CALCIUM 9.8 8.5* 9.0  PROT 6.3* 6.3* 6.1*  ALBUMIN 2.9* 2.6* 2.5*  AST 19 15 17   ALT 15 15 15   ALKPHOS 83 63 62  BILITOT 0.5 0.3 0.5  GFRNONAA >60 57* >60  GFRAA >60 >60 >60  ANIONGAP 14 11 9      Hematology Recent Labs  Lab 02/23/19 1740 02/25/19 0715 02/28/19 1030  WBC 12.1* 13.0* 10.1  RBC 4.38 3.93 3.88  HGB 12.4 11.2* 10.9*  HCT 37.7 33.8* 33.7*  MCV 86.1 86.0 86.9  MCH 28.3 28.5 28.1  MCHC 32.9 33.1 32.3  RDW 14.2 14.6 14.4  PLT 338 323 295    Cardiac EnzymesNo results for input(s): TROPONINI in the last 168 hours. No results for input(s): TROPIPOC in the last 168 hours.   BNPNo results for input(s): BNP, PROBNP in the last 168 hours.   DDimer No results for input(s): DDIMER in the last 168 hours.   Radiology    No results found.  Cardiac Studies   Echo 11/26/18 normal EF 60-65%   Patient Profile     33 y.o. female  With DM, long standing  HTN admitted with concern for pre eclampsia  Assessment & Plan    1. HTN:  On max dose of 4 meds Agree with low dose diuretic Add low dose nitroglycerin and I think this would exhaust oral Rx options She clearly is asymptomatic and had much higher BP's last week No apparent indication to deliver baby pre maturely probably ok to d/c home in am       For questions or updates, please contact [facility] Please consult [email or web address] for contact info under        Signed, Jenkins Rouge, MD  03/02/2019, 9:06 AM

## 2019-03-02 NOTE — Progress Notes (Signed)
   03/02/19 2346  Vital Signs  BP (!) 184/98  Pulse Rate 81  Oxygen Therapy  SpO2 99 %  Pt agreed to take labetalol 40mg  IVP and 800mg  Po at this time. Pt states she does not want any more meds even if BP med until after an hour.

## 2019-03-02 NOTE — Progress Notes (Signed)
Notified provider of elevated BP. No new orders received. Continue to monitor patient recheck BP at 0630 and proceed with AM dose of antihypertensives.

## 2019-03-03 ENCOUNTER — Encounter: Payer: Medicaid Other | Admitting: Internal Medicine

## 2019-03-03 ENCOUNTER — Encounter: Payer: Self-pay | Admitting: Internal Medicine

## 2019-03-03 ENCOUNTER — Inpatient Hospital Stay (HOSPITAL_COMMUNITY): Payer: Medicaid Other

## 2019-03-03 DIAGNOSIS — O119 Pre-existing hypertension with pre-eclampsia, unspecified trimester: Secondary | ICD-10-CM

## 2019-03-03 DIAGNOSIS — O112 Pre-existing hypertension with pre-eclampsia, second trimester: Secondary | ICD-10-CM

## 2019-03-03 DIAGNOSIS — O99212 Obesity complicating pregnancy, second trimester: Secondary | ICD-10-CM

## 2019-03-03 DIAGNOSIS — D259 Leiomyoma of uterus, unspecified: Secondary | ICD-10-CM

## 2019-03-03 DIAGNOSIS — O34219 Maternal care for unspecified type scar from previous cesarean delivery: Secondary | ICD-10-CM

## 2019-03-03 DIAGNOSIS — O24410 Gestational diabetes mellitus in pregnancy, diet controlled: Secondary | ICD-10-CM

## 2019-03-03 DIAGNOSIS — O3412 Maternal care for benign tumor of corpus uteri, second trimester: Secondary | ICD-10-CM

## 2019-03-03 DIAGNOSIS — O10912 Unspecified pre-existing hypertension complicating pregnancy, second trimester: Secondary | ICD-10-CM

## 2019-03-03 DIAGNOSIS — Z3A27 27 weeks gestation of pregnancy: Secondary | ICD-10-CM

## 2019-03-03 LAB — GLUCOSE, CAPILLARY: Glucose-Capillary: 90 mg/dL (ref 70–99)

## 2019-03-03 MED ORDER — HYDROCHLOROTHIAZIDE 12.5 MG PO CAPS
12.5000 mg | ORAL_CAPSULE | Freq: Every day | ORAL | Status: DC
  Administered 2019-03-03 – 2019-03-10 (×8): 12.5 mg via ORAL
  Filled 2019-03-03 (×8): qty 1

## 2019-03-03 NOTE — Progress Notes (Signed)
FACULTY PRACTICE ANTEPARTUM PROGRESS NOTE  Danielle Kent is a 33 y.o. G2P1001 at [redacted]w[redacted]d who is admitted for chronic hypertension with superimposed preeclampsia.  Estimated Date of Delivery: 05/29/19 Fetal presentation is unsure.  Length of Stay:  8 Days. Admitted 02/23/2019  Subjective: Patient reports normal fetal movement.  She denies uterine contractions, denies bleeding and leaking of fluid per vagina.  Vitals:  Blood pressure (!) 152/79, pulse 76, temperature 98.1 F (36.7 C), temperature source Oral, resp. rate 18, height 5' (1.524 m), weight (!) 169.6 kg, last menstrual period 08/31/2018, SpO2 99 %. Physical Examination: CONSTITUTIONAL: Well-developed, well-nourished female in no acute distress.  HENT:  Normocephalic, atraumatic, External right and left ear normal. Oropharynx is clear and moist EYES: Conjunctivae and EOM are normal. Pupils are equal, round, and reactive to light. No scleral icterus.  NECK: Normal range of motion, supple, no masses. SKIN: Skin is warm and dry. No rash noted. Not diaphoretic. No erythema. No pallor. [place]: Alert and oriented to person, place, and time. Normal reflexes, muscle tone coordination. No cranial nerve deficit noted. PSYCHIATRIC: Normal mood and affect. Normal behavior. Normal judgment and thought content. CARDIOVASCULAR: Normal heart rate noted RESPIRATORY: Effort normal, no problems with respiration noted MUSCULOSKELETAL: Normal range of motion. No edema and no tenderness. ABDOMEN: Soft, nontender, nondistended, gravid. CERVIX: deferred  Fetal monitoring: FHR: 135 bpm, Variability: moderate, Accelerations: not Present, Decelerations: Absent  Uterine activity: no contractions per hour  Results for orders placed or performed during the hospital encounter of 02/23/19 (from the past 48 hour(s))  Urinalysis, Routine w reflex microscopic     Status: Abnormal   Collection Time: 03/01/19  4:14 PM  Result Value Ref Range   Color, Urine YELLOW  YELLOW   APPearance CLOUDY (A) CLEAR   Specific Gravity, Urine 1.021 1.005 - 1.030   pH 5.0 5.0 - 8.0   Glucose, UA NEGATIVE NEGATIVE mg/dL   Hgb urine dipstick NEGATIVE NEGATIVE   Bilirubin Urine NEGATIVE NEGATIVE   Ketones, ur 5 (A) NEGATIVE mg/dL   Protein, ur 30 (A) NEGATIVE mg/dL   Nitrite NEGATIVE NEGATIVE   Leukocytes,Ua NEGATIVE NEGATIVE   RBC / HPF 0-5 0 - 5 RBC/hpf   WBC, UA 0-5 0 - 5 WBC/hpf   Bacteria, UA RARE (A) NONE SEEN   Squamous Epithelial / LPF 6-10 0 - 5   Mucus PRESENT     Comment: Performed at [facility], [address], [place], [place] [address]  Protein / creatinine ratio, urine     Status: Abnormal   Collection Time: 03/01/19  4:14 PM  Result Value Ref Range   Creatinine, Urine 174.70 mg/dL   Total Protein, Urine 32 mg/dL    Comment: NO NORMAL RANGE ESTABLISHED FOR THIS TEST   Protein Creatinine Ratio 0.18 (H) 0.00 - 0.15 mg/mg[Cre]    Comment: Performed at [facility] [address], [place], [place] [address]  Glucose, capillary     Status: None   Collection Time: 03/02/19  6:59 AM  Result Value Ref Range   Glucose-Capillary 87 70 - 99 mg/dL  Glucose, capillary     Status: None   Collection Time: 03/03/19  8:26 AM  Result Value Ref Range   Glucose-Capillary 90 70 - 99 mg/dL    I have reviewed the patient's current medications.  ASSESSMENT: Active Problems:   Previous cesarean section   Chronic hypertension during pregnancy, antepartum   Maternal morbid obesity, antepartum ([place])   Preexisting diabetes complicating pregnancy, antepartum  Chronic hypertension with superimposed preeclampsia   Severe hypertension   PLAN: - BP worse overnight but improved today, will add HCTZ low dose and consider nitroglycerin if no improvement - s/p 48 hr mag - s/p BTMZ - s/p NICU consult - fasting glucose daily - appreciate cardiology and nephrology recommendations - patient aware she will get COVID testing at some point when delivery is  imminent   Continue routine antenatal care.   Feliz Beam, M.D. Attending [facility] [employer] (Faculty Practice)  03/03/2019 12:28 PM

## 2019-03-03 NOTE — Progress Notes (Signed)
   03/03/19 0100  Vital Signs  BP (!) 169/97  Pulse Rate 84  Pt BP still elevated, accepted 5mg  IVP hydralazine as ordered. Does not want any meds until after 79mins

## 2019-03-03 NOTE — Progress Notes (Signed)
Dr. Roselie Awkward notified of severe range BP readings, called to clarify restarting HTN protocol per previous order. Verbal order not to give any additional medication at this time, to continue with scheduled PO meds during the night, and recheck BP at 4 hour intervals. RN to continue to monitor, pt aware of plan of care.

## 2019-03-03 NOTE — Progress Notes (Signed)
Progress Note  Patient Name: Danielle Kent Date of Encounter: 03/03/2019  Primary Cardiologist:  Hilty  Subjective   No cardiac complaints   Inpatient Medications    Scheduled Meds: . cloNIDine  0.3 mg Oral TID  . docusate sodium  100 mg Oral Daily  . enoxaparin (LOVENOX) injection  80 mg Subcutaneous Q24H  . hydrALAZINE  100 mg Oral Q6H  . labetalol  800 mg Oral Q8H  . NIFEdipine  60 mg Oral BID  . prenatal multivitamin  1 tablet Oral Q1200  . sodium chloride flush  3 mL Intravenous Q12H   Continuous Infusions:  PRN Meds: acetaminophen, calcium carbonate, hydrALAZINE **AND** hydrALAZINE **AND** labetalol **AND** labetalol **AND** Measure blood pressure, zolpidem   Vital Signs    Vitals:   03/03/19 0400 03/03/19 0500 03/03/19 0622 03/03/19 0800  BP:   (!) 151/88 (!) 156/96  Pulse: 84 81 85 72  Resp:   18 18  Temp:   98.2 F (36.8 C) 98.6 F (37 C)  TempSrc:   Oral Oral  SpO2: 98% 98% 98% 98%  Weight:      Height:       No intake or output data in the 24 hours ending 03/03/19 0821 Filed Weights   02/24/19 0123 02/27/19 0855  Weight: (!) 172.5 kg (!) 169.6 kg    Telemetry    NSR  - Personally Reviewed   Physical Exam   Obese black female   GEN: No acute distress.   Neck: No JVD, no carotid bruits Cardiac:  RRR, no murmurs, rubs, or gallops.  Respiratory: Clear to auscultation bilaterally, no wheezes/ rales/ rhonchi GI: NABS, Soft, nontender, non-distended  MS: No edema; No deformity. Neuro:  Nonfocal, moving all extremities spontaneously Psych: Normal affect  Trace LE edema  Labs    Chemistry Recent Labs  Lab 02/25/19 0715 02/28/19 1030  NA 135 135  K 4.8 3.9  CL 100 104  CO2 24 22  GLUCOSE 136* 123*  BUN 20 17  CREATININE 1.24* 1.04*  CALCIUM 8.5* 9.0  PROT 6.3* 6.1*  ALBUMIN 2.6* 2.5*  AST 15 17  ALT 15 15  ALKPHOS 63 62  BILITOT 0.3 0.5  GFRNONAA 57* >60  GFRAA >60 >60  ANIONGAP 11 9     Hematology Recent Labs  Lab  02/25/19 0715 02/28/19 1030  WBC 13.0* 10.1  RBC 3.93 3.88  HGB 11.2* 10.9*  HCT 33.8* 33.7*  MCV 86.0 86.9  MCH 28.5 28.1  MCHC 33.1 32.3  RDW 14.6 14.4  PLT 323 295     Radiology    No results found.  Cardiac Studies   Echocardiogram 11/2018: IMPRESSIONS    1. The left ventricle has normal systolic function of 97-98%. The cavity size was normal. There is no increased left ventricular wall thickness. Echo evidence of pseudonormalization in diastolic relaxation Elevated left ventricular end-diastolic  pressure.  2. The right ventricle has normal systolic function. The cavity was normal. There is no increase in right ventricular wall thickness. Right ventricular systolic pressure could not be assessed.  3. Left atrial size was mildly dilated.  4. The mitral valve is normal in structure.  5. The tricuspid valve is normal in structure.  6. The aortic valve is normal in structure.  7. The pulmonic valve was normal in structure.  Patient Profile     33 y.o. female with PMH of DM and long standing HTN, admitted for pre-eclampsia. Cardiology following for HTN management.   Assessment &  Plan    1. HTN: BP overall improved but remains elevated. On clonidine, hydralazine, labetalol, and nifedipine. She was given hydrochlorothiazide yesterday x1 dose  - Continue current regimen - Can add additional HCTZ or nitroglycerine if BP climb again - Encourage low sodium diet  Will sign off plan per OB/GYN Outpatient f/u with Dr Debara Pickett  For questions or updates, please contact [facility] Please consult [email or web address] for contact info under Cardiology/STEMI.      Signed, Abigail Butts, PA-C  03/03/2019, 8:21 AM   [phone]

## 2019-03-03 NOTE — Progress Notes (Signed)
Completed hypertension protocol, BP still elevated, Dr. Elonda Husky notified. No new orders.

## 2019-03-04 LAB — TYPE AND SCREEN
ABO/RH(D): O POS
Antibody Screen: NEGATIVE

## 2019-03-04 LAB — GLUCOSE, CAPILLARY: Glucose-Capillary: 87 mg/dL (ref 70–99)

## 2019-03-04 NOTE — Progress Notes (Signed)
Progress Note  Patient Name: Danielle Kent Date of Encounter: 03/04/2019  Primary Cardiologist:  Hilty  Subjective   No cardiac complaints She feels very well Getting nervous every time they go to check her BP. Does not really want to stay until delivery. No signs of pre eclampsia  Inpatient Medications    Scheduled Meds:  cloNIDine  0.3 mg Oral TID   docusate sodium  100 mg Oral Daily   enoxaparin (LOVENOX) injection  80 mg Subcutaneous Q24H   hydrALAZINE  100 mg Oral Q6H   hydrochlorothiazide  12.5 mg Oral Daily   labetalol  800 mg Oral Q8H   NIFEdipine  60 mg Oral BID   prenatal multivitamin  1 tablet Oral Q1200   sodium chloride flush  3 mL Intravenous Q12H   Continuous Infusions:  PRN Meds: acetaminophen, calcium carbonate, hydrALAZINE **AND** hydrALAZINE **AND** labetalol **AND** labetalol **AND** Measure blood pressure, zolpidem   Vital Signs    Vitals:   03/04/19 0645 03/04/19 0650 03/04/19 0654 03/04/19 0700  BP:      Pulse:      Resp:      Temp:      TempSrc:      SpO2: 98% 98% 99% 98%  Weight:      Height:        Intake/Output Summary (Last 24 hours) at 03/04/2019 0828 Last data filed at 03/03/2019 1610 Gross per 24 hour  Intake 100 ml  Output --  Net 100 ml   Filed Weights   02/24/19 0123 02/27/19 0855  Weight: (!) 172.5 kg (!) 169.6 kg    Telemetry    NSR  - Personally Reviewed   Physical Exam   BP (!) 141/87 (BP Location: Right Arm) Comment: notified nurse   Pulse 79    Temp 98.3 F (36.8 C) (Oral)    Resp 18    Ht 5' (1.524 m)    Wt (!) 169.6 kg    LMP 08/31/2018 (Exact Date)    SpO2 98%    BMI 73.04 kg/m   Obese black female   GEN: No acute distress.   Neck: No JVD, no carotid bruits Cardiac:  RRR, no murmurs, rubs, or gallops.  Respiratory: Clear to auscultation bilaterally, no wheezes/ rales/ rhonchi GI: NABS, Soft, nontender, non-distended  MS: No edema; No deformity. Neuro:  Nonfocal, moving all extremities  spontaneously Psych: Normal affect  Trace LE edema  Labs    Chemistry Recent Labs  Lab 02/28/19 1030  NA 135  K 3.9  CL 104  CO2 22  GLUCOSE 123*  BUN 17  CREATININE 1.04*  CALCIUM 9.0  PROT 6.1*  ALBUMIN 2.5*  AST 17  ALT 15  ALKPHOS 62  BILITOT 0.5  GFRNONAA >60  GFRAA >60  ANIONGAP 9     Hematology Recent Labs  Lab 02/28/19 1030  WBC 10.1  RBC 3.88  HGB 10.9*  HCT 33.7*  MCV 86.9  MCH 28.1  MCHC 32.3  RDW 14.4  PLT 295     Radiology    [place] Mfm Ob Limited  Result Date: 03/03/2019 ----------------------------------------------------------------------  OBSTETRICS REPORT                       (Signed Final 03/03/2019 02:56 pm) ---------------------------------------------------------------------- Patient Info  ID #:       [id number]  D.O.B.:  11-18-1985 (32 yrs)  Name:       Danielle Kent                      Visit Date: 03/03/2019 02:40 pm ---------------------------------------------------------------------- Performed By  Performed By:     Valda Favia          Ref. Address:     [address]                    [place],                                                             [place] [address]  Attending:        Tama High MD        Secondary Phy.:   [facility]                                                             [facility]  Referred By:      Clydene Pugh              [facility]                    [place] ---------------------------------------------------------------------- Orders   #  Description                          Code         Ordered By   1  [place] MFM OB LIMITED                    40102.72     KELLY DAVIS  ----------------------------------------------------------------------   #  Order #                    Accession #                 Episode #   1  [id number]                  [id number]                   [id number]  ---------------------------------------------------------------------- Indications   [redacted] weeks gestation of pregnancy                Z3A.27   Hypertension - Chronic with superimposed       O11.9 O10.919   preeclampsia  History of cesarean delivery, currently        O68.219   pregnant   Maternal morbid obesity (67.77)                O99.210 E66.01   Gestational diabetes in pregnancy, diet        O24.410   controlled   Uterine fibroids affecting pregnancy in        O34.12, D25.9   second trimester, antepartum  ---------------------------------------------------------------------- Vital Signs                                                 Height:        5'0" ---------------------------------------------------------------------- Fetal Evaluation  Num Of Fetuses:         1  Fetal Heart Rate(bpm):  142  Cardiac Activity:       Observed  Presentation:           Cephalic  Placenta:               Anterior  P. Cord Insertion:      Previously Visualized  Amniotic Fluid  AFI FV:      Within normal limits                              Largest Pocket(cm)                              3.91 ---------------------------------------------------------------------- OB History  Gravidity:    2         Term:   1  Living:       1 ---------------------------------------------------------------------- Gestational Age  Best:          27w 4d     Det. By:  Previous Ultrasound      EDD:   05/29/19 ---------------------------------------------------------------------- Anatomy  Stomach:               Appears normal, left   Bladder:                Appears normal                         sided ---------------------------------------------------------------------- Cervix Uterus Adnexa  Cervix  Not visualized (advanced GA >24wks) ---------------------------------------------------------------------- Impression  Patient is admitted with severe hypertension with possible  superimposed preeclampsia.  A limited ultrasound study was performed.  Amniotic fluid is  normal and good fetal activity is seen. ---------------------------------------------------------------------- Recommendations  -Weekly ultrasound for now (limited). ----------------------------------------------------------------------                  Tama High, MD Electronically Signed Final Report   03/03/2019 02:56 pm ----------------------------------------------------------------------   Cardiac Studies   Echocardiogram 11/2018: IMPRESSIONS    1. The left ventricle has normal systolic function of 24-09%. The cavity size was normal. There is no increased left ventricular wall thickness. Echo evidence of pseudonormalization in diastolic relaxation Elevated left ventricular end-diastolic  pressure.  2. The right ventricle has normal systolic function. The cavity was normal. There is no increase in right ventricular wall thickness. Right ventricular systolic pressure could not be assessed.  3. Left atrial size was mildly dilated.  4. The mitral valve is normal in structure.  5. The tricuspid  valve is normal in structure.  6. The aortic valve is normal in structure.  7. The pulmonic valve was normal in structure.  Patient Profile     33 y.o. female with PMH of DM and long standing HTN, admitted for pre-eclampsia. Cardiology following for HTN management.   Assessment & Plan    1. HTN: BP overall improved but remains elevated. On clonidine, hydralazine, labetalol, and nifedipine. Currently not on isordil or diuretic per OB - Continue current regimen - Can add isordil 20 mg tid if needed  - Encourage low sodium diet  Will sign off plan per OB/GYN Outpatient f/u with Dr Debara Pickett  For questions or updates, please contact [facility] Please consult [email or web address] for contact info under Cardiology/STEMI.      Signed, Jenkins Rouge, MD  03/04/2019, 8:28 AM   [phone]

## 2019-03-04 NOTE — Progress Notes (Signed)
FACULTY PRACTICE ANTEPARTUM PROGRESS NOTE  Danielle Kent is a 33 y.o. G2P1001 at [redacted]w[redacted]d who is admitted for chronic hypertension with superimposed severe preeclampsia.  Estimated Date of Delivery: 05/29/19 Fetal presentation is cephalic.  Length of Stay:  9 Days. Admitted 02/23/2019  Subjective:  Patient reports normal fetal movement.  She denies uterine contractions, denies bleeding and leaking of fluid per vagina. Patient understandably frustrated about having to be in the hospital until delivery but she is cooperative.  Vitals:  Blood pressure (!) 159/88, pulse 71, temperature 99 F (37.2 C), temperature source Oral, resp. rate 18, height 5' (1.524 m), weight (!) 169.6 kg, last menstrual period 08/31/2018, SpO2 98 %. Physical Examination: CONSTITUTIONAL: Well-developed, well-nourished female in no acute distress.  HENT:  Normocephalic, atraumatic, External right and left ear normal. Oropharynx is clear and moist EYES: Conjunctivae and EOM are normal. Pupils are equal, round, and reactive to light. No scleral icterus.  NECK: Normal range of motion, supple, no masses. SKIN: Skin is warm and dry. No rash noted. Not diaphoretic. No erythema. No pallor. [place]: Alert and oriented to person, place, and time. Normal reflexes, muscle tone coordination. No cranial nerve deficit noted. PSYCHIATRIC: Normal mood and affect. Normal behavior. Normal judgment and thought content. CARDIOVASCULAR: Normal heart rate noted RESPIRATORY: Effort normal, no problems with respiration noted MUSCULOSKELETAL: Normal range of motion. No edema and no tenderness. ABDOMEN: Soft, nontender, nondistended, gravid. CERVIX: deferred  Fetal monitoring: FHR: 145 bpm, Variability: moderate, Accelerations: not present, Decelerations: Absent  Uterine activity: no contractions per hour  Results for orders placed or performed during the hospital encounter of 02/23/19 (from the past 48 hour(s))  Glucose, capillary     Status: None    Collection Time: 03/03/19  8:26 AM  Result Value Ref Range   Glucose-Capillary 90 70 - 99 mg/dL  Glucose, capillary     Status: None   Collection Time: 03/04/19  5:52 AM  Result Value Ref Range   Glucose-Capillary 87 70 - 99 mg/dL  Type and screen [place]     Status: None   Collection Time: 03/04/19  7:49 AM  Result Value Ref Range   ABO/RH(D) O POS    Antibody Screen NEG    Sample Expiration      03/07/2019,2359 Performed at [facility], [address] [place], [place] [address]     I have reviewed the patient's current medications.  ASSESSMENT: Active Problems:   Previous cesarean section   Chronic hypertension during pregnancy, antepartum   Maternal morbid obesity, antepartum ([place])   Preexisting diabetes complicating pregnancy, antepartum   Chronic hypertension with superimposed preeclampsia   Severe hypertension   PLAN: - BP improved with HCTZ - s/p 48 hr mag - s/p BTMZ - s/p NICU consult - fasting glucose daily - appreciate cardiology and nephrology recommendations - COVID testing today   Continue routine antenatal care.   Feliz Beam, M.D. [facility]ending [facility] (Faculty Practice)  03/04/2019 11:33 AM

## 2019-03-04 NOTE — Progress Notes (Signed)
Dr. Roselie Awkward notified of elevated BP readings, does not want to give any medications at this time, will continue to monitor.

## 2019-03-04 NOTE — Progress Notes (Signed)
This encounter was created in error - please disregard.

## 2019-03-05 LAB — GLUCOSE, CAPILLARY: Glucose-Capillary: 84 mg/dL (ref 70–99)

## 2019-03-05 LAB — NOVEL CORONAVIRUS, NAA (HOSP ORDER, SEND-OUT TO REF LAB; TAT 18-24 HRS): SARS-CoV-2, NAA: NOT DETECTED

## 2019-03-05 NOTE — Progress Notes (Signed)
Patient ID: Danielle Kent, female   DOB: 1986/01/05, 33 y.o.   MRN: [id number] [place] ANTEPARTUM COMPREHENSIVE PROGRESS NOTE  Danielle Kent is a 33 y.o. G2P1001 at [redacted]w[redacted]d  who is admitted for chronic hypertension with superimposed severe preeclampsia.   Fetal presentation is cephalic. Length of Stay:  10  Days  Subjective:  Patient reports good fetal movement.  She reports no uterine contractions, no bleeding and no loss of fluid per vagina.  Vitals:  Blood pressure (!) 147/84, pulse 73, temperature 98.6 F (37 C), temperature source Oral, resp. rate 18, height 5' (1.524 m), weight (!) 169.6 kg, last menstrual period 08/31/2018, SpO2 97 %.   Physical Examination: Lungs clear Heart RRR Abd soft + BS obese Ext non tender  Fetal Monitoring:  Baseline: 120 bpm, Variability moderate, no accelerations or decelerations  Labs:  Results for orders placed or performed during the hospital encounter of 02/23/19 (from the past 24 hour(s))  Glucose, capillary   Collection Time: 03/05/19  6:50 AM  Result Value Ref Range   Glucose-Capillary 84 70 - 99 mg/dL    Imaging Studies:    N/A   Medications:  Scheduled . cloNIDine  0.3 mg Oral TID  . docusate sodium  100 mg Oral Daily  . enoxaparin (LOVENOX) injection  80 mg Subcutaneous Q24H  . hydrALAZINE  100 mg Oral Q6H  . hydrochlorothiazide  12.5 mg Oral Daily  . labetalol  800 mg Oral Q8H  . NIFEdipine  60 mg Oral BID  . prenatal multivitamin  1 tablet Oral Q1200  . sodium chloride flush  3 mL Intravenous Q12H   I have reviewed the patient's current medications.  ASSESSMENT: IUP 34 6/7 weeks Chronic HTN with superimposed preeclampsia Prior c section Preexisting DM Morbid obesity  PLAN: BP stable on current regiment. S/P Mag and BMZ.  Continue routine antenatal care.   Chancy Milroy 03/05/2019,11:33 AM

## 2019-03-06 LAB — GLUCOSE, CAPILLARY: Glucose-Capillary: 93 mg/dL (ref 70–99)

## 2019-03-06 NOTE — Progress Notes (Addendum)
[place] ANTEPARTUM PROGRESS NOTE  Danielle Kent is a 33 y.o. G2P1001 at [redacted]w[redacted]d who is admitted for chronic hypertension with superimposed preeclampsia.  Estimated Date of Delivery: 05/29/19 Fetal presentation is cephalic.  Length of Stay:  11 Days. Admitted 02/23/2019  Subjective: Patient reports normal fetal movement.  She denies uterine contractions, denies bleeding and leaking of fluid per vagina.  Vitals:  Blood pressure 132/72, pulse 84, temperature 98.3 F (36.8 C), temperature source Oral, resp. rate 18, height 5' (1.524 m), weight (!) 169.6 kg, last menstrual period 08/31/2018, SpO2 96 %. Physical Examination: CONSTITUTIONAL: Well-developed, well-nourished female in no acute distress.  HENT:  Normocephalic, atraumatic, External right and left ear normal. Oropharynx is clear and moist EYES: Conjunctivae and EOM are normal. Pupils are equal, round, and reactive to light. No scleral icterus.  NECK: Normal range of motion, supple, no masses. SKIN: Skin is warm and dry. No rash noted. Not diaphoretic. No erythema. No pallor. [place]: Alert and oriented to person, place, and time. Normal reflexes, muscle tone coordination. No cranial nerve deficit noted. PSYCHIATRIC: Normal mood and affect. Normal behavior. Normal judgment and thought content. CARDIOVASCULAR: Normal heart rate noted RESPIRATORY: Effort normal, no problems with respiration noted MUSCULOSKELETAL: Normal range of motion. No edema and no tenderness. ABDOMEN: Soft, nontender, nondistended, gravid. CERVIX: deferred  Fetal monitoring: FHR: 140 bpm, Variability:  Minimal to moderate, Accelerations: not present, Decelerations: absent  Uterine activity: no contractions per hour  Results for orders placed or performed during the hospital encounter of 02/23/19 (from the past 48 hour(s))  Glucose, capillary     Status: None   Collection Time: 03/05/19  6:50 AM  Result Value Ref Range   Glucose-Capillary 84 70 - 99 mg/dL   Glucose, capillary     Status: None   Collection Time: 03/06/19  6:15 AM  Result Value Ref Range   Glucose-Capillary 93 70 - 99 mg/dL    I have reviewed the patient's current medications.  ASSESSMENT: Active Problems:   Previous cesarean section   Chronic hypertension during pregnancy, antepartum   Maternal morbid obesity, antepartum ([place])   Preexisting diabetes complicating pregnancy, antepartum   Chronic hypertension with superimposed preeclampsia   Severe hypertension   PLAN: -cont current anti-hypertensive regimen - s/p 48 hr mag - s/p BTMZ - s/p NICU consult - daily fasting glucose - appreciate MFM, cardiology, nephrology recommendations - neg COVID-19 test - repeat lab work tomorrow am   Continue routine antenatal care.   Feliz Beam, M.D. [facility]ending [facility] [employer] [facility] (Faculty Practice)  03/06/2019 10:59 AM

## 2019-03-07 LAB — COMPREHENSIVE METABOLIC PANEL
ALT: 43 U/L (ref 0–44)
AST: 25 U/L (ref 15–41)
Albumin: 2.7 g/dL — ABNORMAL LOW (ref 3.5–5.0)
Alkaline Phosphatase: 79 U/L (ref 38–126)
Anion gap: 9 (ref 5–15)
BUN: 22 mg/dL — ABNORMAL HIGH (ref 6–20)
CO2: 22 mmol/L (ref 22–32)
Calcium: 9.3 mg/dL (ref 8.9–10.3)
Chloride: 103 mmol/L (ref 98–111)
Creatinine, Ser: 1.21 mg/dL — ABNORMAL HIGH (ref 0.44–1.00)
GFR calc Af Amer: >60 mL/min (ref >60)
GFR calc non Af Amer: 59 mL/min — ABNORMAL LOW (ref >60)
Glucose, Bld: 120 mg/dL — ABNORMAL HIGH (ref 70–99)
Potassium: 3.8 mmol/L (ref 3.5–5.1)
Sodium: 134 mmol/L — ABNORMAL LOW (ref 135–145)
Total Bilirubin: 0.4 mg/dL (ref 0.3–1.2)
Total Protein: 6.6 g/dL (ref 6.5–8.1)

## 2019-03-07 LAB — CBC
HCT: 35.9 % — ABNORMAL LOW (ref 36.0–46.0)
Hemoglobin: 11.6 g/dL — ABNORMAL LOW (ref 12.0–15.0)
MCH: 27.9 pg (ref 26.0–34.0)
MCHC: 32.3 g/dL (ref 30.0–36.0)
MCV: 86.3 fL (ref 80.0–100.0)
Platelets: 274 10*3/uL (ref 150–400)
RBC: 4.16 MIL/uL (ref 3.87–5.11)
RDW: 14.2 % (ref 11.5–15.5)
WBC: 10 10*3/uL (ref 4.0–10.5)
nRBC: 0.2 % (ref 0.0–0.2)

## 2019-03-07 LAB — TYPE AND SCREEN
ABO/RH(D): O POS
Antibody Screen: NEGATIVE

## 2019-03-07 LAB — GLUCOSE, CAPILLARY
Glucose-Capillary: 94 mg/dL (ref 70–99)
Glucose-Capillary: 101 mg/dL — ABNORMAL HIGH (ref 70–99)
Glucose-Capillary: 104 mg/dL — ABNORMAL HIGH (ref 70–99)

## 2019-03-07 MED ORDER — NIFEDIPINE ER OSMOTIC RELEASE 30 MG PO TB24: 90.0000 mg | ORAL_TABLET | Freq: Two times a day (BID) | ORAL | Status: DC

## 2019-03-07 MED ORDER — NIFEDIPINE ER OSMOTIC RELEASE 30 MG PO TB24
60.0000 mg | ORAL_TABLET | Freq: Two times a day (BID) | ORAL | Status: DC
  Administered 2019-03-07 – 2019-03-08 (×2): 60 mg via ORAL
  Filled 2019-03-07 (×2): qty 2

## 2019-03-07 NOTE — Progress Notes (Signed)
Pt. refused vitals, requested to come back later after she was done w/ meal.

## 2019-03-07 NOTE — Progress Notes (Signed)
Patient ID: Danielle Kent, female   DOB: April 05, 1986, 33 y.o.   MRN: [id number] [place]) NOTE  Danielle Kent is a 33 y.o. G2P1001 at [redacted]w[redacted]d by best clinical estimate who is admitted for pre-eclampsia with severe features.   Fetal presentation is cephalic. Length of Stay:  12  Days  Subjective:  Patient reports the fetal movement as active. Patient reports uterine contraction  activity as none. Patient reports  vaginal bleeding as none. Patient describes fluid per vagina as None.  Vitals:  Blood pressure (!) 145/89, pulse 77, temperature 98.3 F (36.8 C), resp. rate 18, height 5' (1.524 m), weight (!) 169.6 kg, last menstrual period 08/31/2018, SpO2 97 %. Physical Examination:  General appearance - alert, well appearing, and in no distress Chest - normal effort Abdomen - gravid, non-tender Fundal Height:  size equals dates Extremities: Homans sign is negative, no sign of DVT  Membranes:intact  Fetal Monitoring:  Baseline: 130 bpm, Variability: Good {> 6 bpm), Accelerations: Non-reactive but appropriate for gestational age and Decelerations: Absent  Labs:  Results for orders placed or performed during the hospital encounter of 02/23/19 (from the past 24 hour(s))  Type and screen [place]   Collection Time: 03/07/19  5:40 AM  Result Value Ref Range   ABO/RH(D) O POS    Antibody Screen NEG    Sample Expiration      03/10/2019,2359 Performed at [facility], [place] [address], [place], [place] [address]   CBC   Collection Time: 03/07/19  5:42 AM  Result Value Ref Range   WBC 10.0 4.0 - 10.5 K/uL   RBC 4.16 3.87 - 5.11 MIL/uL   Hemoglobin 11.6 (L) 12.0 - 15.0 g/dL   HCT 35.9 (L) 36.0 - 46.0 %   MCV 86.3 80.0 - 100.0 fL   MCH 27.9 26.0 - 34.0 pg   MCHC 32.3 30.0 - 36.0 g/dL   RDW 14.2 11.5 - 15.5 %   Platelets 274 150 - 400 K/uL   nRBC 0.2 0.0 - 0.2 %  Comprehensive metabolic panel   Collection Time: 03/07/19  5:42 AM  Result  Value Ref Range   Sodium 134 (L) 135 - 145 mmol/L   Potassium 3.8 3.5 - 5.1 mmol/L   Chloride 103 98 - 111 mmol/L   CO2 22 22 - 32 mmol/L   Glucose, Bld 120 (H) 70 - 99 mg/dL   BUN 22 (H) 6 - 20 mg/dL   Creatinine, Ser 1.21 (H) 0.44 - 1.00 mg/dL   Calcium 9.3 8.9 - 10.3 mg/dL   Total Protein 6.6 6.5 - 8.1 g/dL   Albumin 2.7 (L) 3.5 - 5.0 g/dL   AST 25 15 - 41 U/L   ALT 43 0 - 44 U/L   Alkaline Phosphatase 79 38 - 126 U/L   Total Bilirubin 0.4 0.3 - 1.2 mg/dL   GFR calc non Af Amer 59 (L) >60 mL/min   GFR calc Af Amer >60 >60 mL/min   Anion gap 9 5 - 15  Glucose, capillary   Collection Time: 03/07/19  6:23 AM  Result Value Ref Range   Glucose-Capillary 94 70 - 99 mg/dL      Medications:  Scheduled . cloNIDine  0.3 mg Oral TID  . docusate sodium  100 mg Oral Daily  . enoxaparin (LOVENOX) injection  80 mg Subcutaneous Q24H  . hydrALAZINE  100 mg Oral Q6H  . hydrochlorothiazide  12.5 mg Oral Daily  . labetalol  800 mg  Oral Q8H  . NIFEdipine  60 mg Oral BID  . prenatal multivitamin  1 tablet Oral Q1200  . sodium chloride flush  3 mL Intravenous Q12H   I have reviewed the patient's current medications.  ASSESSMENT: Active Problems:   Previous cesarean section   Chronic hypertension during pregnancy, antepartum   Maternal morbid obesity, antepartum ([place])   Preexisting diabetes complicating pregnancy, antepartum   Chronic hypertension with superimposed preeclampsia   Severe hypertension   PLAN: One severe range time yesterday after getting out of the shower. She has asked to not increase her meds at this time. Labs are stable, Cr is 1.21, LFTs and plts are WNL Normal fasting CBGs, will add 2 hour pp checks Continue inpatient until delivery. S/p BMZ   [name], MD 03/07/2019,11:40 AM

## 2019-03-08 LAB — GLUCOSE, CAPILLARY
Glucose-Capillary: 90 mg/dL (ref 70–99)
Glucose-Capillary: 100 mg/dL — ABNORMAL HIGH (ref 70–99)
Glucose-Capillary: 117 mg/dL — ABNORMAL HIGH (ref 70–99)
Glucose-Capillary: 121 mg/dL — ABNORMAL HIGH (ref 70–99)

## 2019-03-08 MED ORDER — NIFEDIPINE ER OSMOTIC RELEASE 30 MG PO TB24
90.0000 mg | ORAL_TABLET | Freq: Two times a day (BID) | ORAL | Status: DC
  Administered 2019-03-08 – 2019-04-03 (×52): 90 mg via ORAL
  Filled 2019-03-08 (×53): qty 3

## 2019-03-08 NOTE — Progress Notes (Signed)
0750- Pt. BP 175/111. Pt. Preference and MD aware that we will try with scheduled PO BP meds at this time. Will recheck in one hour.  0850- BP lowered to acceptable range will continue to monitor.

## 2019-03-08 NOTE — Progress Notes (Signed)
Patient ID: Danielle Kent, female   DOB: 11-17-1985, 33 y.o.   MRN: [id number] [place]) NOTE  Danielle Kent is a 33 y.o. G2P1001 at [redacted]w[redacted]d by best clinical estimate who is admitted for pre-eclampsia with severe features.   Fetal presentation is cephalic. Length of Stay:  13  Days  Subjective: Feels well. No new issues. BP remains elevated Patient reports the fetal movement as active. Patient reports uterine contraction  activity as none. Patient reports  vaginal bleeding as none. Patient describes fluid per vagina as None.  Vitals:  Blood pressure (!) 175/111, pulse 79, temperature 98.8 F (37.1 C), temperature source Oral, resp. rate 18, height 5' (1.524 m), weight (!) 169.6 kg, last menstrual period 08/31/2018, SpO2 98 %. Physical Examination:  General appearance - alert, well appearing, and in no distress Chest - normal effort Abdomen - gravid, non-tender Fundal Height:  size greater than dates Extremities: Homans sign is negative, no sign of DVT  Membranes:intact  Fetal Monitoring:  Baseline: 130 bpm, Variability: Good {> 6 bpm), Accelerations: Non-reactive but appropriate for gestational age and Decelerations: Absent  Labs:  Results for orders placed or performed during the hospital encounter of 02/23/19 (from the past 24 hour(s))  Glucose, capillary   Collection Time: 03/07/19  5:37 PM  Result Value Ref Range   Glucose-Capillary 101 (H) 70 - 99 mg/dL   Comment 1 Notify RN    Comment 2 Document in Chart   Glucose, capillary   Collection Time: 03/07/19 10:30 PM  Result Value Ref Range   Glucose-Capillary 104 (H) 70 - 99 mg/dL  Glucose, capillary   Collection Time: 03/08/19  7:52 AM  Result Value Ref Range   Glucose-Capillary 90 70 - 99 mg/dL    Medications:  Scheduled . cloNIDine  0.3 mg Oral TID  . docusate sodium  100 mg Oral Daily  . enoxaparin (LOVENOX) injection  80 mg Subcutaneous Q24H  . hydrALAZINE  100 mg Oral Q6H  .  hydrochlorothiazide  12.5 mg Oral Daily  . labetalol  800 mg Oral Q8H  . NIFEdipine  60 mg Oral BID  . prenatal multivitamin  1 tablet Oral Q1200  . sodium chloride flush  3 mL Intravenous Q12H   I have reviewed the patient's current medications.  ASSESSMENT: Active Problems:   Previous cesarean section   Chronic hypertension during pregnancy, antepartum   Maternal morbid obesity, antepartum ([place])   Preexisting diabetes complicating pregnancy, antepartum   Chronic hypertension with superimposed preeclampsia   Severe hypertension   PLAN: Continue inpatient treatment May need to increase her Procardia if her BP's remain up. NICU is full right now, would try to keep her pregnant if possible.  [name], MD 03/08/2019,8:01 AM

## 2019-03-09 LAB — CBC
HCT: 35.9 % — ABNORMAL LOW (ref 36.0–46.0)
Hemoglobin: 11.8 g/dL — ABNORMAL LOW (ref 12.0–15.0)
MCH: 28.2 pg (ref 26.0–34.0)
MCHC: 32.9 g/dL (ref 30.0–36.0)
MCV: 85.7 fL (ref 80.0–100.0)
Platelets: 276 10*3/uL (ref 150–400)
RBC: 4.19 MIL/uL (ref 3.87–5.11)
RDW: 14.2 % (ref 11.5–15.5)
WBC: 9.5 10*3/uL (ref 4.0–10.5)
nRBC: 0 % (ref 0.0–0.2)

## 2019-03-09 LAB — COMPREHENSIVE METABOLIC PANEL
ALT: 39 U/L (ref 0–44)
AST: 23 U/L (ref 15–41)
Albumin: 2.6 g/dL — ABNORMAL LOW (ref 3.5–5.0)
Alkaline Phosphatase: 77 U/L (ref 38–126)
Anion gap: 11 (ref 5–15)
BUN: 20 mg/dL (ref 6–20)
CO2: 22 mmol/L (ref 22–32)
Calcium: 9.5 mg/dL (ref 8.9–10.3)
Chloride: 102 mmol/L (ref 98–111)
Creatinine, Ser: 1.18 mg/dL — ABNORMAL HIGH (ref 0.44–1.00)
GFR calc Af Amer: >60 mL/min (ref >60)
GFR calc non Af Amer: >60 mL/min (ref >60)
Glucose, Bld: 110 mg/dL — ABNORMAL HIGH (ref 70–99)
Potassium: 3.9 mmol/L (ref 3.5–5.1)
Sodium: 135 mmol/L (ref 135–145)
Total Bilirubin: 0.4 mg/dL (ref 0.3–1.2)
Total Protein: 6.6 g/dL (ref 6.5–8.1)

## 2019-03-09 LAB — GLUCOSE, CAPILLARY
Glucose-Capillary: 88 mg/dL (ref 70–99)
Glucose-Capillary: 129 mg/dL — ABNORMAL HIGH (ref 70–99)
Glucose-Capillary: 98 mg/dL (ref 70–99)
Glucose-Capillary: 110 mg/dL — ABNORMAL HIGH (ref 70–99)

## 2019-03-09 NOTE — Progress Notes (Signed)
Patient ID: Danielle Kent, female   DOB: Jun 05, 1986, 33 y.o.   MRN: [id number]  [place]) NOTE  Danielle Kent is a 33 y.o. G2P1001 at [redacted]w[redacted]d by best clinical estimate who is admitted for pre-eclampsia with severe features.   Fetal presentation is cephalic.  Length of Stay:  14  Days  Subjective: Feels well. No new issues. BP remains elevated today but not severe range  Patient reports the fetal movement as active. Patient reports uterine contraction  activity as none. Patient reports  vaginal bleeding as none. Patient describes fluid per vagina as None.  Vitals:  Blood pressure (!) 157/80, pulse 78, temperature 98.4 F (36.9 C), temperature source Oral, resp. rate 16, height 5' (1.524 m), weight (!) 169.6 kg, last menstrual period 08/31/2018, SpO2 99 %.   Physical Examination:  General appearance - alert, well appearing, and in no distress Chest - normal effort Abdomen - gravid, non-tender Fundal Height:  size greater than dates Extremities: Homans sign is negative, no sign of DVT  Membranes:intact  Fetal Monitoring:  Baseline: 130 bpm, Variability: Good {> 6 bpm), Accelerations: Non-reactive but appropriate for gestational age and Decelerations: Absent  Labs:  Results for orders placed or performed during the hospital encounter of 02/23/19 (from the past 24 hour(s))  Glucose, capillary   Collection Time: 03/08/19 12:52 PM  Result Value Ref Range   Glucose-Capillary 100 (H) 70 - 99 mg/dL  Glucose, capillary   Collection Time: 03/08/19  6:05 PM  Result Value Ref Range   Glucose-Capillary 117 (H) 70 - 99 mg/dL  Glucose, capillary   Collection Time: 03/08/19  9:46 PM  Result Value Ref Range   Glucose-Capillary 121 (H) 70 - 99 mg/dL  CBC   Collection Time: 03/09/19  5:21 AM  Result Value Ref Range   WBC 9.5 4.0 - 10.5 K/uL   RBC 4.19 3.87 - 5.11 MIL/uL   Hemoglobin 11.8 (L) 12.0 - 15.0 g/dL   HCT 35.9 (L) 36.0 - 46.0 %   MCV 85.7 80.0 - 100.0 fL    MCH 28.2 26.0 - 34.0 pg   MCHC 32.9 30.0 - 36.0 g/dL   RDW 14.2 11.5 - 15.5 %   Platelets 276 150 - 400 K/uL   nRBC 0.0 0.0 - 0.2 %  Comprehensive metabolic panel   Collection Time: 03/09/19  5:21 AM  Result Value Ref Range   Sodium 135 135 - 145 mmol/L   Potassium 3.9 3.5 - 5.1 mmol/L   Chloride 102 98 - 111 mmol/L   CO2 22 22 - 32 mmol/L   Glucose, Bld 110 (H) 70 - 99 mg/dL   BUN 20 6 - 20 mg/dL   Creatinine, Ser 1.18 (H) 0.44 - 1.00 mg/dL   Calcium 9.5 8.9 - 10.3 mg/dL   Total Protein 6.6 6.5 - 8.1 g/dL   Albumin 2.6 (L) 3.5 - 5.0 g/dL   AST 23 15 - 41 U/L   ALT 39 0 - 44 U/L   Alkaline Phosphatase 77 38 - 126 U/L   Total Bilirubin 0.4 0.3 - 1.2 mg/dL   GFR calc non Af Amer >60 >60 mL/min   GFR calc Af Amer >60 >60 mL/min   Anion gap 11 5 - 15  Glucose, capillary   Collection Time: 03/09/19  6:48 AM  Result Value Ref Range   Glucose-Capillary 88 70 - 99 mg/dL    Medications:  Scheduled . cloNIDine  0.3 mg Oral TID  . docusate sodium  100 mg Oral  Daily  . enoxaparin (LOVENOX) injection  80 mg Subcutaneous Q24H  . hydrALAZINE  100 mg Oral Q6H  . hydrochlorothiazide  12.5 mg Oral Daily  . labetalol  800 mg Oral Q8H  . NIFEdipine  90 mg Oral BID  . prenatal multivitamin  1 tablet Oral Q1200  . sodium chloride flush  3 mL Intravenous Q12H   I have reviewed the patient's current medications.  ASSESSMENT: Active Problems:   Previous cesarean section   Chronic hypertension during pregnancy, antepartum   Maternal morbid obesity, antepartum ([place])   Preexisting diabetes complicating pregnancy, antepartum   Chronic hypertension with superimposed preeclampsia   Severe hypertension   PLAN:  #Superimposed Preeclampsia on Chronic HTN:  -Continue inpatient treatment - May need to increase her Procardia if her BP's elevated. - Has MFM scans q weekly, last on 5/14 -Cardiology consulted previously  #FWB: s/p BMZ, NICU consult completed and plan to extend pregnancy as  long as possible.   Caren Macadam, MD 03/09/2019,10:19 AM

## 2019-03-10 ENCOUNTER — Inpatient Hospital Stay (HOSPITAL_COMMUNITY): Payer: Medicaid Other

## 2019-03-10 DIAGNOSIS — J45909 Unspecified asthma, uncomplicated: Secondary | ICD-10-CM

## 2019-03-10 DIAGNOSIS — O34219 Maternal care for unspecified type scar from previous cesarean delivery: Secondary | ICD-10-CM

## 2019-03-10 DIAGNOSIS — O10913 Unspecified pre-existing hypertension complicating pregnancy, third trimester: Secondary | ICD-10-CM

## 2019-03-10 DIAGNOSIS — D259 Leiomyoma of uterus, unspecified: Secondary | ICD-10-CM

## 2019-03-10 DIAGNOSIS — O9989 Other specified diseases and conditions complicating pregnancy, childbirth and the puerperium: Secondary | ICD-10-CM

## 2019-03-10 DIAGNOSIS — Z3A28 28 weeks gestation of pregnancy: Secondary | ICD-10-CM

## 2019-03-10 DIAGNOSIS — O24410 Gestational diabetes mellitus in pregnancy, diet controlled: Secondary | ICD-10-CM

## 2019-03-10 DIAGNOSIS — O113 Pre-existing hypertension with pre-eclampsia, third trimester: Secondary | ICD-10-CM

## 2019-03-10 DIAGNOSIS — O99213 Obesity complicating pregnancy, third trimester: Secondary | ICD-10-CM

## 2019-03-10 DIAGNOSIS — O3413 Maternal care for benign tumor of corpus uteri, third trimester: Secondary | ICD-10-CM

## 2019-03-10 LAB — TYPE AND SCREEN
ABO/RH(D): O POS
Antibody Screen: NEGATIVE

## 2019-03-10 LAB — GLUCOSE, CAPILLARY
Glucose-Capillary: 94 mg/dL (ref 70–99)
Glucose-Capillary: 100 mg/dL — ABNORMAL HIGH (ref 70–99)
Glucose-Capillary: 109 mg/dL — ABNORMAL HIGH (ref 70–99)

## 2019-03-10 MED ORDER — SOD CITRATE-CITRIC ACID 500-334 MG/5ML PO SOLN
ORAL | Status: AC
  Filled 2019-03-10: qty 15

## 2019-03-10 NOTE — Progress Notes (Signed)
Patient ID: Breea Loncar, female   DOB: 05-Jan-1986, 33 y.o.   MRN: [id number] [place]) NOTE  Kashina Mecum is a 33 y.o. G2P1001 at [redacted]w[redacted]d by best clinical estimate who is admitted for pre-eclampsia with severe features by BP.   Fetal presentation is cephalic. Length of Stay:  15  Days  Subjective: Feels well, baby is moving well. Patient reports the fetal movement as active. Patient reports uterine contraction  activity as none. Patient reports  vaginal bleeding as none. Patient describes fluid per vagina as None.  Vitals:  Blood pressure (!) 156/95, pulse 77, temperature 98.1 F (36.7 C), temperature source Oral, resp. rate 18, height 5' (1.524 m), weight (!) 169.6 kg, last menstrual period 08/31/2018, SpO2 97 %. Physical Examination:  General appearance - alert, well appearing, and in no distress Chest - normal effort Abdomen - gravid, non-tender Fundal Height:  size equals dates Extremities: Homans sign is negative, no sign of DVT  Membranes:intact  Fetal Monitoring:  Baseline: 130 bpm, Variability: Good {> 6 bpm), Accelerations: Non-reactive but appropriate for gestational age and Decelerations: Absent  Labs:  Results for orders placed or performed during the hospital encounter of 02/23/19 (from the past 24 hour(s))  Glucose, capillary   Collection Time: 03/09/19 11:21 AM  Result Value Ref Range   Glucose-Capillary 129 (H) 70 - 99 mg/dL  Glucose, capillary   Collection Time: 03/09/19  6:12 PM  Result Value Ref Range   Glucose-Capillary 98 70 - 99 mg/dL   Comment 1 Notify RN    Comment 2 Document in Chart   Glucose, capillary   Collection Time: 03/09/19  9:24 PM  Result Value Ref Range   Glucose-Capillary 110 (H) 70 - 99 mg/dL  Type and screen [place]   Collection Time: 03/10/19  5:36 AM  Result Value Ref Range   ABO/RH(D) O POS    Antibody Screen NEG    Sample Expiration      03/13/2019,2359 Performed at [facility], [address] [place], [place] [address]   Glucose, capillary   Collection Time: 03/10/19  6:51 AM  Result Value Ref Range   Glucose-Capillary 94 70 - 99 mg/dL     Medications:  Scheduled . cloNIDine  0.3 mg Oral TID  . docusate sodium  100 mg Oral Daily  . enoxaparin (LOVENOX) injection  80 mg Subcutaneous Q24H  . hydrALAZINE  100 mg Oral Q6H  . hydrochlorothiazide  12.5 mg Oral Daily  . labetalol  800 mg Oral Q8H  . NIFEdipine  90 mg Oral BID  . prenatal multivitamin  1 tablet Oral Q1200  . sodium chloride flush  3 mL Intravenous Q12H  . sodium citrate-citric acid       I have reviewed the patient's current medications.  ASSESSMENT: Active Problems:   Previous cesarean section   Chronic hypertension during pregnancy, antepartum   Maternal morbid obesity, antepartum ([place])   Preexisting diabetes complicating pregnancy, antepartum   Chronic hypertension with superimposed preeclampsia   Severe hypertension   PLAN: BP is ok, for now. On 5 drug regimen Had 1 decel, but now FHR is back to baseline--has BPP today--continue monitoring. Delivery is indicated with worsening BP control or unstable maternal/fetal unit. CBGs are ok  [name], MD 03/10/2019,11:07 AM

## 2019-03-10 NOTE — Progress Notes (Signed)
Prolonged decel into the 90s x20min with return to baseline of 130. Pt denies any contractions. Positive fetal movement. Abdomen soft on palpation. No vaginal bleeding or leakage of fluid. Dr. Kennon Rounds notified and has reviewed fetal heart rate tracing. Instructed to do extended monitoring. Will continue to monitor. Toya Smothers, RN

## 2019-03-11 LAB — GLUCOSE, CAPILLARY
Glucose-Capillary: 81 mg/dL (ref 70–99)
Glucose-Capillary: 123 mg/dL — ABNORMAL HIGH (ref 70–99)
Glucose-Capillary: 131 mg/dL — ABNORMAL HIGH (ref 70–99)
Glucose-Capillary: 97 mg/dL (ref 70–99)

## 2019-03-11 MED ORDER — HYDROCHLOROTHIAZIDE 25 MG PO TABS
25.0000 mg | ORAL_TABLET | Freq: Every day | ORAL | Status: DC
  Administered 2019-03-11 – 2019-03-12 (×2): 25 mg via ORAL
  Filled 2019-03-11 (×2): qty 1

## 2019-03-11 NOTE — Progress Notes (Signed)
Assessed patients vasculature for placing another PIV/midline. Patient has very limited options. Assessed current IV. At this time able to flush and draw back blood after redressing IV site. No swelling, redness, or pain noted at IV site at this time. Instructed nurse to notify [facility] if further assistence with line care is needed. VU. Fran Lowes, RN [facility]

## 2019-03-11 NOTE — Progress Notes (Signed)
Pt refusing hypertension protocol and wishes to "wait for the other medications to start working". RN called MD to inform of pt refusal. RN will recheck BP 30 mins after hydralazine 5mg  administration.   Nicholes Calamity, RN

## 2019-03-11 NOTE — Progress Notes (Addendum)
Patient ID: Brinn Westby, female   DOB: [birth year], 33 y.o.   MRN: [id number] [place]) NOTE  Sanita Estrada is a 33 y.o. G2P1001 at [redacted]w[redacted]d by best clinical estimate who is admitted for pre-ecalmpsia with severe features by blood pressure.   Fetal presentation is cephalic. Length of Stay:  16  Days  Subjective: Feels well. No new issues. CBGs show good glycemic control Patient reports the fetal movement as active. Patient reports uterine contraction  activity as none. Patient reports  vaginal bleeding as none. Patient describes fluid per vagina as None.  Vitals:  Blood pressure (!) 151/86, pulse 80, temperature 98.5 F (36.9 C), temperature source Oral, resp. rate 16, height 5' (1.524 m), weight (!) 169.6 kg, last menstrual period 08/31/2018, SpO2 99 %. Physical Examination:  General appearance - alert, well appearing, and in no distress Chest - normal effort Abdomen - gravid, non-tender Fundal Height:  size equals dates Extremities: Homans sign is negative, no sign of DVT  Membranes:intact  Fetal Monitoring:  Baseline: 130 bpm, Variability: Good {> 6 bpm), Accelerations: Non-reactive but appropriate for gestational age and Decelerations: Absent  Labs:  Results for orders placed or performed during the hospital encounter of 02/23/19 (from the past 24 hour(s))  Glucose, capillary   Collection Time: 03/10/19  4:33 PM  Result Value Ref Range   Glucose-Capillary 100 (H) 70 - 99 mg/dL  Glucose, capillary   Collection Time: 03/10/19 10:02 PM  Result Value Ref Range   Glucose-Capillary 109 (H) 70 - 99 mg/dL  Glucose, capillary   Collection Time: 03/11/19  6:45 AM  Result Value Ref Range   Glucose-Capillary 81 70 - 99 mg/dL  Glucose, capillary   Collection Time: 03/11/19 10:55 AM  Result Value Ref Range   Glucose-Capillary 123 (H) 70 - 99 mg/dL    Medications:  Scheduled . cloNIDine  0.3 mg Oral TID  . docusate sodium  100 mg Oral Daily  . enoxaparin  (LOVENOX) injection  80 mg Subcutaneous Q24H  . hydrALAZINE  100 mg Oral Q6H  . hydrochlorothiazide  25 mg Oral Daily  . labetalol  800 mg Oral Q8H  . NIFEdipine  90 mg Oral BID  . prenatal multivitamin  1 tablet Oral Q1200  . sodium chloride flush  3 mL Intravenous Q12H   I have reviewed the patient's current medications.  ASSESSMENT: Active Problems:   Previous cesarean section   Chronic hypertension during pregnancy, antepartum   Maternal morbid obesity, antepartum ([place])   Preexisting diabetes complicating pregnancy, antepartum   Chronic hypertension with superimposed preeclampsia   Severe hypertension   PLAN: Continue anti-hypertensives She has no s/sx's of pre-eclampsia at present--on multiple meds will continue these as they seem to be working and BPP reassuring yesterday and if we can gain more time, all the better. She may go for wheel chair rides. I have increased her HCTZ to 25 mg. Will continue Clonidine, as it is working. Continue fasting and 2 hour pp CBG checks Discussed risks of TOLAC vs. RCS--she wants what is safest--will need to re-visit--likely for RCS I have discussed what would push [place] to delivery, persistently elevated BP's or abnormal labs--repeat q wk  [name], MD 03/11/2019,11:31 AM

## 2019-03-12 DIAGNOSIS — Z3A28 28 weeks gestation of pregnancy: Secondary | ICD-10-CM

## 2019-03-12 LAB — GLUCOSE, CAPILLARY
Glucose-Capillary: 92 mg/dL (ref 70–99)
Glucose-Capillary: 142 mg/dL — ABNORMAL HIGH (ref 70–99)
Glucose-Capillary: 118 mg/dL — ABNORMAL HIGH (ref 70–99)
Glucose-Capillary: 119 mg/dL — ABNORMAL HIGH (ref 70–99)

## 2019-03-13 LAB — TYPE AND SCREEN
ABO/RH(D): O POS
Antibody Screen: NEGATIVE

## 2019-03-13 LAB — COMPREHENSIVE METABOLIC PANEL
ALT: 44 U/L (ref 0–44)
AST: 24 U/L (ref 15–41)
Albumin: 2.7 g/dL — ABNORMAL LOW (ref 3.5–5.0)
Alkaline Phosphatase: 79 U/L (ref 38–126)
Anion gap: 10 (ref 5–15)
BUN: 24 mg/dL — ABNORMAL HIGH (ref 6–20)
CO2: 21 mmol/L — ABNORMAL LOW (ref 22–32)
Calcium: 9.5 mg/dL (ref 8.9–10.3)
Chloride: 102 mmol/L (ref 98–111)
Creatinine, Ser: 1.2 mg/dL — ABNORMAL HIGH (ref 0.44–1.00)
GFR calc Af Amer: >60 mL/min (ref >60)
GFR calc non Af Amer: 59 mL/min — ABNORMAL LOW (ref >60)
Glucose, Bld: 137 mg/dL — ABNORMAL HIGH (ref 70–99)
Potassium: 3.9 mmol/L (ref 3.5–5.1)
Sodium: 133 mmol/L — ABNORMAL LOW (ref 135–145)
Total Bilirubin: 0.2 mg/dL — ABNORMAL LOW (ref 0.3–1.2)
Total Protein: 6.7 g/dL (ref 6.5–8.1)

## 2019-03-13 LAB — GLUCOSE, CAPILLARY
Glucose-Capillary: 107 mg/dL — ABNORMAL HIGH (ref 70–99)
Glucose-Capillary: 117 mg/dL — ABNORMAL HIGH (ref 70–99)
Glucose-Capillary: 117 mg/dL — ABNORMAL HIGH (ref 70–99)
Glucose-Capillary: 135 mg/dL — ABNORMAL HIGH (ref 70–99)

## 2019-03-13 LAB — CBC
HCT: 36.2 % (ref 36.0–46.0)
Hemoglobin: 11.8 g/dL — ABNORMAL LOW (ref 12.0–15.0)
MCH: 28 pg (ref 26.0–34.0)
MCHC: 32.6 g/dL (ref 30.0–36.0)
MCV: 85.8 fL (ref 80.0–100.0)
Platelets: 265 10*3/uL (ref 150–400)
RBC: 4.22 MIL/uL (ref 3.87–5.11)
RDW: 13.9 % (ref 11.5–15.5)
WBC: 8.6 10*3/uL (ref 4.0–10.5)
nRBC: 0 % (ref 0.0–0.2)

## 2019-03-13 MED ORDER — HYDROCHLOROTHIAZIDE 50 MG PO TABS
50.0000 mg | ORAL_TABLET | Freq: Every day | ORAL | Status: DC
  Administered 2019-03-13: 10:00:00 50 mg via ORAL
  Filled 2019-03-13: qty 2
  Filled 2019-03-13 (×2): qty 1

## 2019-03-13 NOTE — Progress Notes (Addendum)
Patient ID: Danielle Kent, female   DOB: 1986-10-01, 33 y.o.   MRN: [id number] [place]) NOTE  Danielle Kent is a 33 y.o. G2P1001 at [redacted]w[redacted]d by best clinical estimate who is admitted for pre-ecalmpsia with severe features by blood pressure.  Also has T2DM, Fetal presentation is cephalic. Length of Stay:  17  Days  Subjective: Feels well. No new issues. CBGs show good glycemic control.  Patient denies any headaches, visual symptoms, RUQ/epigastric pain or other concerning symptoms.  Patient reports the fetal movement as active. Patient reports uterine contraction  activity as none. Patient reports  vaginal bleeding as none. Patient describes fluid per vagina as None.  Vitals:  Blood pressure (!) 153/90, pulse 74, temperature 98.3 F (36.8 C), temperature source Oral, resp. rate 18, height 5' (1.524 m), weight (!) 169.6 kg, last menstrual period 08/31/2018, SpO2 98 %. Physical Examination: General appearance - alert, well appearing, and in no distress Chest - normal effort Abdomen - gravid, non-tender Fundal Height:  size equals dates Extremities: Homans sign is negative, no sign of DVT  Membranes:intact  Fetal Monitoring:  Baseline: 130 bpm, Variability: Good {> 6 bpm), Accelerations: Non-reactive but appropriate for gestational age and Decelerations: Absent  Labs:  Results for orders placed or performed during the hospital encounter of 02/23/19 (from the past 48 hour(s))  Glucose, capillary     Status: None   Collection Time: 03/11/19  6:45 AM  Result Value Ref Range   Glucose-Capillary 81 70 - 99 mg/dL  Glucose, capillary     Status: Abnormal   Collection Time: 03/11/19 10:55 AM  Result Value Ref Range   Glucose-Capillary 123 (H) 70 - 99 mg/dL  Glucose, capillary     Status: Abnormal   Collection Time: 03/11/19  7:23 PM  Result Value Ref Range   Glucose-Capillary 131 (H) 70 - 99 mg/dL  Glucose, capillary     Status: None   Collection Time: 03/11/19  10:32 PM  Result Value Ref Range   Glucose-Capillary 97 70 - 99 mg/dL  Glucose, capillary     Status: None   Collection Time: 03/12/19  6:48 AM  Result Value Ref Range   Glucose-Capillary 92 70 - 99 mg/dL     Medications:  Scheduled . cloNIDine  0.3 mg Oral TID  . docusate sodium  100 mg Oral Daily  . enoxaparin (LOVENOX) injection  80 mg Subcutaneous Q24H  . hydrALAZINE  100 mg Oral Q6H  . hydrochlorothiazide  25 mg Oral Daily  . labetalol  800 mg Oral Q8H  . NIFEdipine  90 mg Oral BID  . prenatal multivitamin  1 tablet Oral Q1200  . sodium chloride flush  3 mL Intravenous Q12H   I have reviewed the patient's current medications.  ASSESSMENT: Active Problems:   Previous cesarean section   Chronic hypertension during pregnancy, antepartum   Maternal morbid obesity, antepartum ([place])   Preexisting diabetes complicating pregnancy, antepartum   Chronic hypertension with superimposed preeclampsia   Severe hypertension   PLAN: Continue anti-hypertensives She has no s/sx's of pre-eclampsia at present--on multiple meds will continue these for now and titrate as needed Continue fasting and 2 hour pp CBG checks, continue diet control Delivery indicated for severe maternal-fetal compromise,  persistently severe elevated BP's or abnormal labs--repeat q wk Routine antenatal care  Verita Schneiders, MD

## 2019-03-13 NOTE — Progress Notes (Signed)
Patient ID: Sade Hollon, female   DOB: Jun 28, 1986, 33 y.o.   MRN: [id number] [place]) NOTE  Alene Bergerson is a 33 y.o. G2P1001 at [redacted]w[redacted]d by best clinical estimate who is admitted for pre-ecalmpsia with severe features by blood pressure.  Also has T2DM, Fetal presentation is cephalic. Length of Stay:  18  Days  Subjective: Feels well. No new issues. CBGs show good glycemic control.  Patient denies any headaches, visual symptoms, RUQ/epigastric pain or other concerning symptoms.  Patient reports the fetal movement as active. Patient reports uterine contraction  activity as none. Patient reports  vaginal bleeding as none. Patient describes fluid per vagina as None.  Vitals:   Patient Vitals for the past 24 hrs:  BP Temp Temp src Pulse Resp SpO2  03/13/19 0648 (!) 152/91 98.4 F (36.9 C) Oral 79 18 100 %  03/12/19 2320 (!) 160/107 98.2 F (36.8 C) Oral 77 18 98 %  03/12/19 2030 138/74 - - 85 - -  03/12/19 2014 (!) 178/105 98.4 F (36.9 C) Oral 91 18 99 %  03/12/19 1636 (!) 142/71 98.7 F (37.1 C) Oral 80 18 98 %  03/12/19 1300 137/82 - - 76 18 -  03/12/19 1133 112/64 - - 83 - -  03/12/19 1132 112/64 98 F (36.7 C) Oral 82 18 100 %  03/12/19 1048 - - - - 19 -  03/12/19 0936 - - - - 18 -  03/12/19 0836 - - - - 18 -  03/12/19 0812 (!) 153/90 - - - - -  03/12/19 0757 (!) 164/103 98.3 F (36.8 C) Oral 74 17 99 %  03/12/19 0740 - - - - 16 -    Physical Examination: General appearance - alert, well appearing, and in no distress Chest - normal effort Abdomen - gravid, non-tender Fundal Height:  size equals dates Extremities: Homans sign is negative, no sign of DVT  Membranes:intact  Fetal Monitoring:  Baseline: 130 bpm, Variability: Good {> 6 bpm), Accelerations: Non-reactive but appropriate for gestational age and Decelerations: Absent  Labs:  Results for orders placed or performed during the hospital encounter of 02/23/19 (from the past 24 hour(s))   Glucose, capillary     Status: Abnormal   Collection Time: 03/12/19 12:12 PM  Result Value Ref Range   Glucose-Capillary 142 (H) 70 - 99 mg/dL  Glucose, capillary     Status: Abnormal   Collection Time: 03/12/19  5:10 PM  Result Value Ref Range   Glucose-Capillary 118 (H) 70 - 99 mg/dL  Glucose, capillary     Status: Abnormal   Collection Time: 03/12/19 10:01 PM  Result Value Ref Range   Glucose-Capillary 119 (H) 70 - 99 mg/dL  Type and screen [place]     Status: None   Collection Time: 03/13/19  5:45 AM  Result Value Ref Range   ABO/RH(D) O POS    Antibody Screen NEG    Sample Expiration      03/16/2019,2359 Performed at [facility], [address] [place], [place] [address]   CBC     Status: Abnormal   Collection Time: 03/13/19  5:45 AM  Result Value Ref Range   WBC 8.6 4.0 - 10.5 K/uL   RBC 4.22 3.87 - 5.11 MIL/uL   Hemoglobin 11.8 (L) 12.0 - 15.0 g/dL   HCT 36.2 36.0 - 46.0 %   MCV 85.8 80.0 - 100.0 fL   MCH 28.0 26.0 - 34.0 pg   MCHC 32.6  30.0 - 36.0 g/dL   RDW 13.9 11.5 - 15.5 %   Platelets 265 150 - 400 K/uL   nRBC 0.0 0.0 - 0.2 %  Comprehensive metabolic panel     Status: Abnormal   Collection Time: 03/13/19  5:45 AM  Result Value Ref Range   Sodium 133 (L) 135 - 145 mmol/L   Potassium 3.9 3.5 - 5.1 mmol/L   Chloride 102 98 - 111 mmol/L   CO2 21 (L) 22 - 32 mmol/L   Glucose, Bld 137 (H) 70 - 99 mg/dL   BUN 24 (H) 6 - 20 mg/dL   Creatinine, Ser 1.20 (H) 0.44 - 1.00 mg/dL   Calcium 9.5 8.9 - 10.3 mg/dL   Total Protein 6.7 6.5 - 8.1 g/dL   Albumin 2.7 (L) 3.5 - 5.0 g/dL   AST 24 15 - 41 U/L   ALT 44 0 - 44 U/L   Alkaline Phosphatase 79 38 - 126 U/L   Total Bilirubin 0.2 (L) 0.3 - 1.2 mg/dL   GFR calc non Af Amer 59 (L) >60 mL/min   GFR calc Af Amer >60 >60 mL/min   Anion gap 10 5 - 15  Glucose, capillary     Status: Abnormal   Collection Time: 03/13/19  6:50 AM  Result Value Ref Range   Glucose-Capillary 107 (H) 70 - 99  mg/dL   CBC Latest Ref Rng & Units 03/13/2019 03/09/2019 03/07/2019  WBC 4.0 - 10.5 K/uL 8.6 9.5 10.0  Hemoglobin 12.0 - 15.0 g/dL 11.8(L) 11.8(L) 11.6(L)  Hematocrit 36.0 - 46.0 % 36.2 35.9(L) 35.9(L)  Platelets 150 - 400 K/uL 265 276 274   CMP Latest Ref Rng & Units 03/13/2019 03/09/2019 03/07/2019  Glucose 70 - 99 mg/dL 137(H) 110(H) 120(H)  BUN 6 - 20 mg/dL 24(H) 20 22(H)  Creatinine 0.44 - 1.00 mg/dL 1.20(H) 1.18(H) 1.21(H)  Sodium 135 - 145 mmol/L 133(L) 135 134(L)  Potassium 3.5 - 5.1 mmol/L 3.9 3.9 3.8  Chloride 98 - 111 mmol/L 102 102 103  CO2 22 - 32 mmol/L 21(L) 22 22  Calcium 8.9 - 10.3 mg/dL 9.5 9.5 9.3  Total Protein 6.5 - 8.1 g/dL 6.7 6.6 6.6  Total Bilirubin 0.3 - 1.2 mg/dL 0.2(L) 0.4 0.4  Alkaline Phos 38 - 126 U/L 79 77 79  AST 15 - 41 U/L 24 23 25   ALT 0 - 44 U/L 44 39 43     Medications:  Scheduled . cloNIDine  0.3 mg Oral TID  . docusate sodium  100 mg Oral Daily  . enoxaparin (LOVENOX) injection  80 mg Subcutaneous Q24H  . hydrALAZINE  100 mg Oral Q6H  . hydrochlorothiazide  50 mg Oral Daily  . labetalol  800 mg Oral Q8H  . NIFEdipine  90 mg Oral BID  . prenatal multivitamin  1 tablet Oral Q1200  . sodium chloride flush  3 mL Intravenous Q12H   I have reviewed the patient's current medications.  ASSESSMENT: Active Problems:   Previous cesarean section   Chronic hypertension during pregnancy, antepartum   Maternal morbid obesity, antepartum ([place])   Preexisting diabetes complicating pregnancy, antepartum   Chronic hypertension with superimposed preeclampsia   Severe hypertension   PLAN: Continue anti-hypertensives She has no s/sx's of pre-eclampsia at present--on multiple meds will continue these for now and titrate as needed Continue fasting and 2 hour pp CBG checks, continue diet control Delivery indicated for severe maternal-fetal compromise,  persistently severe elevated BP's or abnormal labs--repeat q wk Routine antenatal  care  Verita Schneiders,  MD

## 2019-03-13 NOTE — Final Progress Note (Signed)
Pt wanted B/P checked after she eats her dinner . Rn will go back and recheck.

## 2019-03-14 DIAGNOSIS — O10913 Unspecified pre-existing hypertension complicating pregnancy, third trimester: Secondary | ICD-10-CM

## 2019-03-14 LAB — GLUCOSE, CAPILLARY
Glucose-Capillary: 106 mg/dL — ABNORMAL HIGH (ref 70–99)
Glucose-Capillary: 124 mg/dL — ABNORMAL HIGH (ref 70–99)
Glucose-Capillary: 120 mg/dL — ABNORMAL HIGH (ref 70–99)
Glucose-Capillary: 123 mg/dL — ABNORMAL HIGH (ref 70–99)

## 2019-03-14 MED ORDER — HYDROCHLOROTHIAZIDE 50 MG PO TABS: 50.0000 mg | ORAL_TABLET | Freq: Every day | ORAL | Status: DC

## 2019-03-14 MED ORDER — HYDROCHLOROTHIAZIDE 25 MG PO TABS
50.0000 mg | ORAL_TABLET | Freq: Every day | ORAL | Status: DC
  Administered 2019-03-14 – 2019-04-02 (×20): 50 mg via ORAL
  Filled 2019-03-14 (×22): qty 2

## 2019-03-14 NOTE — Progress Notes (Signed)
Patient ID: Danielle Kent, female   DOB: 01/26/1986, 33 y.o.   MRN: [id number] [place]) NOTE  Danielle Kent is a 33 y.o. G2P1001 with Estimated Date of Delivery: 05/29/19   By   [redacted]w[redacted]d  who is admitted for BP management.    Fetal presentation is unsure.cephalic on last sonogram Length of Stay:  19  Days  Date of admission:02/23/2019  Subjective: No headache or visual changes, no complaints Patient reports the fetal movement as active. Patient reports uterine contraction  activity as none. Patient reports  vaginal bleeding as none. Patient describes fluid per vagina as None.  Vitals:  Blood pressure 135/79, pulse 82, temperature 98 F (36.7 C), temperature source Oral, resp. rate 18, height 5' (1.524 m), weight (!) 169.6 kg, last menstrual period 08/31/2018, SpO2 98 %. Vitals:   03/13/19 2044 03/14/19 0026 03/14/19 0104 03/14/19 0502  BP: (!) 160/76 (!) 157/97  135/79  Pulse: 96 81  82  Resp: 18 18  18   Temp: 98 F (36.7 C) 98 F (36.7 C)  98 F (36.7 C)  TempSrc:    Oral  SpO2:   98% 98%  Weight:      Height:       Physical Examination:  General appearance - alert, well appearing, and in no distress Abdomen - benign Fundal Height:  unsure Pelvic Exam:  examination not indicated Cervical Exam: Not evaluated.  Extremities: extremities normal, atraumatic, no cyanosis or edema with DTRs 2+ bilaterally Membranes:intact  Fetal Monitoring:  Reassuring for gestational age     Labs:  Results for orders placed or performed during the hospital encounter of 02/23/19 (from the past 24 hour(s))  Glucose, capillary   Collection Time: 03/13/19  6:50 AM  Result Value Ref Range   Glucose-Capillary 107 (H) 70 - 99 mg/dL  Glucose, capillary   Collection Time: 03/13/19 10:47 AM  Result Value Ref Range   Glucose-Capillary 117 (H) 70 - 99 mg/dL  Glucose, capillary   Collection Time: 03/13/19  6:42 PM  Result Value Ref Range   Glucose-Capillary 117 (H) 70 - 99 mg/dL   Glucose, capillary   Collection Time: 03/13/19  9:55 PM  Result Value Ref Range   Glucose-Capillary 135 (H) 70 - 99 mg/dL    Imaging Studies:      Medications:  Scheduled . cloNIDine  0.3 mg Oral TID  . docusate sodium  100 mg Oral Daily  . enoxaparin (LOVENOX) injection  80 mg Subcutaneous Q24H  . hydrALAZINE  100 mg Oral Q6H  . hydrochlorothiazide  50 mg Oral Daily  . labetalol  800 mg Oral Q8H  . NIFEdipine  90 mg Oral BID  . prenatal multivitamin  1 tablet Oral Q1200  . sodium chloride flush  3 mL Intravenous Q12H   I have reviewed the patient's current medications.  ASSESSMENT: G2P1001 [redacted]w[redacted]d Estimated Date of Delivery: 05/29/19  Patient Active Problem List   Diagnosis Date Noted  . Severe hypertension   . Chronic hypertension with superimposed preeclampsia 02/23/2019  . Preexisting diabetes complicating pregnancy, antepartum 12/27/2018  . Previous cesarean section 12/06/2018  . Chronic hypertension during pregnancy, antepartum 12/06/2018  . Maternal morbid obesity, antepartum ([place]) 12/06/2018  . Supervision of high risk pregnancy, antepartum 11/25/2018  . Hypertensive urgency 11/25/2018    PLAN: >continue clonidine, hydralazine, HCTZ, labetalol and procardia: No IV anti hypertensive medications needed in the last 24 hours >Elevated fasting yesterday am but 2 hours are ok, consider adding pm metformin if continues >Deliver for  fetal indications or development of severe pre eclampsia >continue weekly sonograms for feta surveillance and twice daily monitoring   Luther H Eure 03/14/2019,5:52 AM

## 2019-03-14 NOTE — Plan of Care (Signed)
  Problem: Health Behavior/Discharge Planning: Goal: Ability to manage health-related needs will improve Outcome: Progressing   Problem: Clinical Measurements: Goal: Complications related to disease process, condition or treatment will be avoided or minimized Outcome: Progressing

## 2019-03-15 ENCOUNTER — Inpatient Hospital Stay (HOSPITAL_COMMUNITY): Payer: Medicaid Other

## 2019-03-15 DIAGNOSIS — Z3A29 29 weeks gestation of pregnancy: Secondary | ICD-10-CM

## 2019-03-15 DIAGNOSIS — J45909 Unspecified asthma, uncomplicated: Secondary | ICD-10-CM

## 2019-03-15 DIAGNOSIS — O24410 Gestational diabetes mellitus in pregnancy, diet controlled: Secondary | ICD-10-CM

## 2019-03-15 DIAGNOSIS — O3413 Maternal care for benign tumor of corpus uteri, third trimester: Secondary | ICD-10-CM

## 2019-03-15 DIAGNOSIS — O289 Unspecified abnormal findings on antenatal screening of mother: Secondary | ICD-10-CM

## 2019-03-15 DIAGNOSIS — O113 Pre-existing hypertension with pre-eclampsia, third trimester: Secondary | ICD-10-CM

## 2019-03-15 DIAGNOSIS — N189 Chronic kidney disease, unspecified: Secondary | ICD-10-CM | POA: Diagnosis present

## 2019-03-15 DIAGNOSIS — Z6841 Body Mass Index (BMI) 40.0 and over, adult: Secondary | ICD-10-CM

## 2019-03-15 DIAGNOSIS — O9989 Other specified diseases and conditions complicating pregnancy, childbirth and the puerperium: Secondary | ICD-10-CM

## 2019-03-15 DIAGNOSIS — D259 Leiomyoma of uterus, unspecified: Secondary | ICD-10-CM

## 2019-03-15 DIAGNOSIS — O99213 Obesity complicating pregnancy, third trimester: Secondary | ICD-10-CM

## 2019-03-15 DIAGNOSIS — O34219 Maternal care for unspecified type scar from previous cesarean delivery: Secondary | ICD-10-CM

## 2019-03-15 DIAGNOSIS — O10913 Unspecified pre-existing hypertension complicating pregnancy, third trimester: Secondary | ICD-10-CM

## 2019-03-15 LAB — GLUCOSE, CAPILLARY
Glucose-Capillary: 95 mg/dL (ref 70–99)
Glucose-Capillary: 111 mg/dL — ABNORMAL HIGH (ref 70–99)
Glucose-Capillary: 111 mg/dL — ABNORMAL HIGH (ref 70–99)
Glucose-Capillary: 132 mg/dL — ABNORMAL HIGH (ref 70–99)

## 2019-03-15 NOTE — Progress Notes (Addendum)
Daily Antepartum Note  Admission Date: 02/23/2019 Current Date: 03/15/2019 10:17 AM  Danielle Kent is a 33 y.o. G2P1001 @ [redacted]w[redacted]d, HD#21, admitted for severe pre-eclampsia (BPs) superimposed on chronic HTN.  Pregnancy complicated by: Patient Active Problem List   Diagnosis Date Noted  . BMI 60.0-69.9, adult ([place]) 03/15/2019  . Chronic renal insufficiency 03/15/2019  . Severe hypertension   . Chronic hypertension with superimposed preeclampsia 02/23/2019  . Preexisting diabetes complicating pregnancy, antepartum 12/27/2018  . Previous cesarean section 12/06/2018  . Chronic hypertension during pregnancy, antepartum 12/06/2018  . Maternal morbid obesity, antepartum ([place]) 12/06/2018  . Supervision of high risk pregnancy, antepartum 11/25/2018  . Hypertensive urgency 11/25/2018    Overnight/24hr events:  none  Subjective:  No s/s of pre-eclampsia, preterm labor or decreased FM  Objective:    Current Vital Signs 24h Vital Sign Ranges  T 98.5 F (36.9 C) Temp  Avg: 98.1 F (36.7 C)  Min: 97.9 F (36.6 C)  Max: 98.5 F (36.9 C)  BP (!) 152/86(RN BEDSIDE) BP  Min: 124/73  Max: 167/99  HR 79 Pulse  Avg: 78.8  Min: 73  Max: 85  RR 18 Resp  Avg: 17.3  Min: 16  Max: 18  SaO2 99 % Room Air SpO2  Avg: 98.1 %  Min: 93 %  Max: 100 %       24 Hour I/O Current Shift I/O  Time Ins Outs No intake/output data recorded. No intake/output data recorded.   Patient Vitals for the past 24 hrs:  BP Temp Temp src Pulse Resp SpO2  03/15/19 0815 (!) 152/86 98.5 F (36.9 C) Oral 79 18 99 %  03/15/19 0814 - - - - - 98 %  03/15/19 0649 - - - - - 99 %  03/15/19 0644 - - - - - 99 %  03/15/19 0639 - - - - - 98 %  03/15/19 0634 - - - - - 99 %  03/15/19 0629 - - - - - 98 %  03/15/19 0624 - - - - - 99 %  03/15/19 0619 - - - - - 98 %  03/15/19 0615 - - - - - 99 %  03/15/19 0614 - - - - - 99 %  03/15/19 0609 - - - - - 93 %  03/15/19 0604 - - - - - 97 %  03/15/19 0559 - - - - - 99 %  03/15/19 0554 - - - -  - 99 %  03/15/19 0549 - - - - - 99 %  03/15/19 0544 - - - - - 99 %  03/15/19 0539 - - - - - 98 %  03/15/19 0534 - - - - - 99 %  03/15/19 0529 - - - - - 98 %  03/15/19 0524 - - - - - 98 %  03/15/19 0519 - - - - - 98 %  03/15/19 0514 - - - - - 98 %  03/15/19 0509 - - - - - 98 %  03/15/19 0504 - - - - - 98 %  03/15/19 0459 - - - - - 98 %  03/15/19 0454 - - - - - 99 %  03/15/19 0449 - - - - - 99 %  03/15/19 0444 - - - - - 99 %  03/15/19 0439 - - - - - 99 %  03/15/19 0434 - - - - - 99 %  03/15/19 0429 - - - - - 99 %  03/15/19 0424 - - - - -  99 %  03/15/19 0419 - - - - - 99 %  03/15/19 0417 (!) 157/105 98.3 F (36.8 C) - 75 18 97 %  03/15/19 0415 - - - - - 97 %  03/15/19 0414 - - - - - 97 %  03/15/19 0409 - - - - - 98 %  03/15/19 0404 - - - - - 98 %  03/15/19 0359 - - - - - 98 %  03/15/19 0354 - - - - - 98 %  03/15/19 0349 - - - - - 98 %  03/15/19 0344 - - - - - 98 %  03/15/19 0339 - - - - - 98 %  03/15/19 0334 - - - - - 98 %  03/15/19 0329 - - - - - 98 %  03/15/19 0324 - - - - - 98 %  03/15/19 0319 - - - - - 98 %  03/15/19 0314 - - - - - 98 %  03/15/19 0309 - - - - - 98 %  03/15/19 0304 - - - - - 98 %  03/15/19 0259 - - - - - 98 %  03/15/19 0254 - - - - - 98 %  03/15/19 0249 - - - - - 98 %  03/15/19 0244 - - - - - 98 %  03/15/19 0239 - - - - - 98 %  03/15/19 0234 - - - - - 98 %  03/15/19 0229 - - - - - 97 %  03/15/19 0224 - - - - - 97 %  03/15/19 0219 - - - - - 97 %  03/15/19 0215 - - - - - 98 %  03/15/19 0214 - - - - - 98 %  03/15/19 0209 - - - - - 97 %  03/15/19 0204 - - - - - 97 %  03/15/19 0036 (!) 160/100 98 F (36.7 C) - 73 18 -  03/15/19 0035 (!) 162/107 - - 73 - -  03/14/19 2106 (!) 147/89 98 F (36.7 C) Oral 85 18 -  03/14/19 1727 (!) 160/91 - - 83 - -  03/14/19 1620 (!) 167/99 97.9 F (36.6 C) Oral 82 16 100 %  03/14/19 1200 124/73 98 F (36.7 C) Oral 80 16 -   FHR: 135 baseline, no accels or decel, min to mod variablity Toco: quiet   Physical  exam: General: Well nourished, well developed female in no acute distress. Abdomen: obese, nttp, nd Cardiovascular: S1, S2 normal, no murmur, rub or gallop, regular rate and rhythm Respiratory: CTAB Extremities: no clubbing, cyanosis or edema Skin: Warm and dry.   Medications: Current Facility-Administered Medications  Medication Dose Route Frequency Provider Last Rate Last Dose  . acetaminophen (TYLENOL) tablet 650 mg  650 mg Oral Q4H PRN Tresea Mall, CNM      . calcium carbonate (TUMS - dosed in mg elemental calcium) chewable tablet 400 mg of elemental calcium  2 tablet Oral Q4H PRN Tresea Mall, CNM      . cloNIDine (CATAPRES) tablet 0.3 mg  0.3 mg Oral TID Sloan Leiter, MD   0.3 mg at 03/15/19 0640  . docusate sodium (COLACE) capsule 100 mg  100 mg Oral Daily Marcille Buffy D, CNM   100 mg at 03/12/19 1038  . enoxaparin (LOVENOX) injection 80 mg  80 mg Subcutaneous Q24H Sloan Leiter, MD   80 mg at 03/15/19 7741  . hydrALAZINE (APRESOLINE) injection 5 mg  5 mg Intravenous PRN Marcille Buffy  D, CNM   5 mg at 03/11/19 1415   And  . hydrALAZINE (APRESOLINE) injection 10 mg  10 mg Intravenous PRN Marcille Buffy D, CNM   10 mg at 03/11/19 1432   And  . labetalol (NORMODYNE) injection 20 mg  20 mg Intravenous PRN Marcille Buffy D, CNM   20 mg at 03/11/19 1449   And  . labetalol (NORMODYNE) injection 40 mg  40 mg Intravenous PRN Marcille Buffy D, CNM   40 mg at 03/02/19 2349  . hydrALAZINE (APRESOLINE) tablet 100 mg  100 mg Oral Q6H Barrett, Rhonda G, PA-C   100 mg at 03/15/19 8299  . hydrochlorothiazide (HYDRODIURIL) tablet 50 mg  50 mg Oral Daily Sloan Leiter, MD   50 mg at 03/14/19 1006  . labetalol (NORMODYNE) tablet 800 mg  800 mg Oral Q8H Sloan Leiter, MD   800 mg at 03/15/19 3716  . NIFEdipine (PROCARDIA-XL/NIFEDICAL-XL) 24 hr tablet 90 mg  90 mg Oral BID [name], MD   90 mg at 03/14/19 2201  . prenatal multivitamin tablet 1 tablet  1 tablet Oral Q1200 Marcille Buffy D, CNM   1 tablet at 03/14/19 1158  . sodium chloride flush (NS) 0.9 % injection 3 mL  3 mL Intravenous Q12H Chancy Milroy, MD   3 mL at 03/14/19 2201  . zolpidem (AMBIEN) tablet 5 mg  5 mg Oral QHS PRN Tresea Mall, CNM        Labs:  Recent Labs  Lab 03/09/19 0521 03/13/19 0545  WBC 9.5 8.6  HGB 11.8* 11.8*  HCT 35.9* 36.2  PLT 276 265    Recent Labs  Lab 03/09/19 0521 03/13/19 0545  NA 135 133*  K 3.9 3.9  CL 102 102  CO2 22 21*  BUN 20 24*  CREATININE 1.18* 1.20*  CALCIUM 9.5 9.5  PROT 6.6 6.7  BILITOT 0.4 0.2*  ALKPHOS 77 79  ALT 39 44  AST 23 24  GLUCOSE 110* 137*   Results for CHIMAMANDA, SIEGFRIED (MRN [id number]) as of 03/15/2019 10:22  Ref. Range 03/14/2019 06:53 03/14/2019 11:30 03/14/2019 16:25 03/14/2019 21:53 03/15/2019 06:51  Glucose-Capillary Latest Ref Range: 70 - 99 mg/dL 106 (H) 124 (H) 120 (H) 123 (H) 95    Radiology: 1/75: cephalic, AFI 10, bpp 8/8 5/7: efw 36%, 852gm, AC 43%, nl AFI and S/D dopplers  Assessment & Plan:  Pt stable *Pregnancy: will d/w pt re: BTL. Follow up NST for today.  -neg: 2h GTT *cHTN with superimposed severe pre-x: continue BP regimen. Repeat labs tomorrow. Repeat BPP and growth on 5/28.  -s/p Cards consult (signed off). Add isordil 20 tid PRN per last note.  -11/2018 echo unremarkable *BMI 60s: will d/w anesthesia and make them aware.  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  Follow up labs tomorrow -Neprohology signed off. Nothing to do currently. *DM2: Stable on no meds.  *Preterm: nothing to do -BMZ on 5/6 and 5/7. S/p NICU consult. S/p Mg on admission  *h/o c-section: d/w pt more re: delivery mode *PPx: lovenox, oob ad lib *FEN/GI: DM2 diet.  *Dispo: inpatient until delivery. Hope to get to 34wks.   Durene Romans MD Attending Center for [facility])

## 2019-03-15 NOTE — Progress Notes (Signed)
Ms. Boucher has been down/upset for the past 2 days.  She has only been able to see her daughter through the window once and her daughter was very upset and crying.  This is day 20 for pt being inpatient and she is visibly more withdrawn.  Dr. Ilda Basset on unit and notified of s/s.  He states he is fine w/ the pt going on a WC ride or going to pt patio area rather than being in room all day.  Pt informed that she can go out of room & she was pleased.  Pt states she will let RN know when she is ready.

## 2019-03-16 ENCOUNTER — Encounter (HOSPITAL_COMMUNITY): Payer: Self-pay | Admitting: Anesthesiology

## 2019-03-16 ENCOUNTER — Encounter (HOSPITAL_COMMUNITY): Payer: Self-pay | Admitting: *Deleted

## 2019-03-16 ENCOUNTER — Encounter: Payer: Self-pay | Admitting: Obstetrics and Gynecology

## 2019-03-16 DIAGNOSIS — O329XX0 Maternal care for malpresentation of fetus, unspecified, not applicable or unspecified: Secondary | ICD-10-CM | POA: Diagnosis present

## 2019-03-16 LAB — CBC
HCT: 36.6 % (ref 36.0–46.0)
Hemoglobin: 12.2 g/dL (ref 12.0–15.0)
MCH: 28.5 pg (ref 26.0–34.0)
MCHC: 33.3 g/dL (ref 30.0–36.0)
MCV: 85.5 fL (ref 80.0–100.0)
Platelets: 222 10*3/uL (ref 150–400)
RBC: 4.28 MIL/uL (ref 3.87–5.11)
RDW: 14 % (ref 11.5–15.5)
WBC: 8 10*3/uL (ref 4.0–10.5)
nRBC: 0 % (ref 0.0–0.2)

## 2019-03-16 LAB — GLUCOSE, CAPILLARY
Glucose-Capillary: 97 mg/dL (ref 70–99)
Glucose-Capillary: 106 mg/dL — ABNORMAL HIGH (ref 70–99)
Glucose-Capillary: 112 mg/dL — ABNORMAL HIGH (ref 70–99)
Glucose-Capillary: 133 mg/dL — ABNORMAL HIGH (ref 70–99)

## 2019-03-16 LAB — COMPREHENSIVE METABOLIC PANEL
ALT: 39 U/L (ref 0–44)
AST: 21 U/L (ref 15–41)
Albumin: 2.6 g/dL — ABNORMAL LOW (ref 3.5–5.0)
Alkaline Phosphatase: 70 U/L (ref 38–126)
Anion gap: 12 (ref 5–15)
BUN: 24 mg/dL — ABNORMAL HIGH (ref 6–20)
CO2: 24 mmol/L (ref 22–32)
Calcium: 9.7 mg/dL (ref 8.9–10.3)
Chloride: 101 mmol/L (ref 98–111)
Creatinine, Ser: 1.28 mg/dL — ABNORMAL HIGH (ref 0.44–1.00)
GFR calc Af Amer: >60 mL/min (ref >60)
GFR calc non Af Amer: 55 mL/min — ABNORMAL LOW (ref >60)
Glucose, Bld: 102 mg/dL — ABNORMAL HIGH (ref 70–99)
Potassium: 4.1 mmol/L (ref 3.5–5.1)
Sodium: 137 mmol/L (ref 135–145)
Total Bilirubin: 0.2 mg/dL — ABNORMAL LOW (ref 0.3–1.2)
Total Protein: 6.5 g/dL (ref 6.5–8.1)

## 2019-03-16 LAB — HEMOGLOBIN A1C
Hgb A1c MFr Bld: 6.2 % — ABNORMAL HIGH (ref 4.8–5.6)
Mean Plasma Glucose: 131.24 mg/dL

## 2019-03-16 LAB — TYPE AND SCREEN
ABO/RH(D): O POS
Antibody Screen: NEGATIVE

## 2019-03-16 NOTE — Progress Notes (Signed)
Daily Antepartum Note  Admission Date: 02/23/2019 Current Date: 03/16/2019 10:16 AM  Danielle Kent is a 33 y.o. G2P1001 @ [redacted]w[redacted]d, HD#22, admitted for severe pre-eclampsia (BPs) superimposed on chronic HTN.  Pregnancy complicated by: Patient Active Problem List   Diagnosis Date Noted  . BMI 70 and over, adult ([place]) 03/15/2019  . Chronic renal insufficiency 03/15/2019  . Severe hypertension   . Chronic hypertension with superimposed preeclampsia 02/23/2019  . Preexisting diabetes complicating pregnancy, antepartum 12/27/2018  . Previous cesarean section 12/06/2018  . Chronic hypertension during pregnancy, antepartum 12/06/2018  . Maternal morbid obesity, antepartum ([place]) 12/06/2018  . Supervision of high risk pregnancy, antepartum 11/25/2018  . Hypertensive urgency 11/25/2018    Overnight/24hr events:  none  Subjective:  No s/s of pre-eclampsia, preterm labor or decreased FM  Objective:    Current Vital Signs 24h Vital Sign Ranges  T 98.6 F (37 C) Temp  Avg: 98 F (36.7 C)  Min: 97.4 F (36.3 C)  Max: 98.6 F (37 C)  BP (!) 156/91(RN BEDSIDE) BP  Min: 135/82  Max: 186/99  HR 87 Pulse  Avg: 86.3  Min: 79  Max: 94  RR 18 Resp  Avg: 18  Min: 18  Max: 18  SaO2 99 % Room Air SpO2  Avg: 98.7 %  Min: 97 %  Max: 100 %       24 Hour I/O Current Shift I/O  Time Ins Outs No intake/output data recorded. No intake/output data recorded.   Patient Vitals for the past 24 hrs:  BP Temp Temp src Pulse Resp SpO2 Weight  03/16/19 0758 (!) 156/91 98.6 F (37 C) Oral 87 18 99 % -  03/16/19 0429 (!) 148/87 98.2 F (36.8 C) Oral 79 18 100 % -  03/16/19 0015 (!) 158/97 97.9 F (36.6 C) Oral 84 18 98 % -  03/16/19 0013 - 97.9 F (36.6 C) Oral - - - -  03/15/19 2030 (!) 147/98 - - 89 18 - -  03/15/19 2015 (!) 186/99 - - 94 18 - -  03/15/19 2014 - - - - - 97 % -  03/15/19 1548 (!) 145/78 (!) 97.4 F (36.3 C) Oral 88 18 99 % -  03/15/19 1230 - - - - - - (!) 160.9 kg  03/15/19 1150 135/82 98  F (36.7 C) Oral 83 18 99 % -  03/15/19 1149 - - - - - 99 % -   FHR: 130 baseline, no accels or decel, mod variablity Toco: quiet   Physical exam: General: Well nourished, well developed female in no acute distress. Abdomen: obese, nttp, nd Cardiovascular: S1, S2 normal, no murmur, rub or gallop, regular rate and rhythm Respiratory: CTAB Extremities: no clubbing, cyanosis or edema Skin: Warm and dry.   Medications: Current Facility-Administered Medications  Medication Dose Route Frequency Provider Last Rate Last Dose  . acetaminophen (TYLENOL) tablet 650 mg  650 mg Oral Q4H PRN Tresea Mall, CNM      . calcium carbonate (TUMS - dosed in mg elemental calcium) chewable tablet 400 mg of elemental calcium  2 tablet Oral Q4H PRN Tresea Mall, CNM      . cloNIDine (CATAPRES) tablet 0.3 mg  0.3 mg Oral TID Sloan Leiter, MD   0.3 mg at 03/16/19 0759  . docusate sodium (COLACE) capsule 100 mg  100 mg Oral Daily Marcille Buffy D, CNM   100 mg at 03/15/19 1030  . enoxaparin (LOVENOX) injection 80 mg  80 mg Subcutaneous  Q24H Sloan Leiter, MD   80 mg at 03/16/19 0800  . hydrALAZINE (APRESOLINE) injection 5 mg  5 mg Intravenous PRN Marcille Buffy D, CNM   5 mg at 03/11/19 1415   And  . hydrALAZINE (APRESOLINE) injection 10 mg  10 mg Intravenous PRN Marcille Buffy D, CNM   10 mg at 03/11/19 1432   And  . labetalol (NORMODYNE) injection 20 mg  20 mg Intravenous PRN Marcille Buffy D, CNM   20 mg at 03/11/19 1449   And  . labetalol (NORMODYNE) injection 40 mg  40 mg Intravenous PRN Marcille Buffy D, CNM   40 mg at 03/02/19 2349  . hydrALAZINE (APRESOLINE) tablet 100 mg  100 mg Oral Q6H Barrett, Rhonda G, PA-C   100 mg at 03/16/19 0759  . hydrochlorothiazide (HYDRODIURIL) tablet 50 mg  50 mg Oral Daily Sloan Leiter, MD   50 mg at 03/15/19 1030  . labetalol (NORMODYNE) tablet 800 mg  800 mg Oral Q8H Sloan Leiter, MD   800 mg at 03/16/19 0759  . NIFEdipine (PROCARDIA-XL/NIFEDICAL-XL) 24  hr tablet 90 mg  90 mg Oral BID [name], MD   90 mg at 03/15/19 2203  . prenatal multivitamin tablet 1 tablet  1 tablet Oral Q1200 Marcille Buffy D, CNM   1 tablet at 03/15/19 1143  . sodium chloride flush (NS) 0.9 % injection 3 mL  3 mL Intravenous Q12H Chancy [place], MD   3 mL at 03/15/19 2344  . zolpidem (AMBIEN) tablet 5 mg  5 mg Oral QHS PRN Tresea Mall, CNM        Labs:  Recent Labs  Lab 03/13/19 0545 03/16/19 0645  WBC 8.6 8.0  HGB 11.8* 12.2  HCT 36.2 36.6  PLT 265 222    Recent Labs  Lab 03/13/19 0545 03/16/19 0645  NA 133* 137  K 3.9 4.1  CL 102 101  CO2 21* 24  BUN 24* 24*  CREATININE 1.20* 1.28*  CALCIUM 9.5 9.7  PROT 6.7 6.5  BILITOT 0.2* 0.2*  ALKPHOS 79 70  ALT 44 39  AST 24 21  GLUCOSE 137* 102*   Results for AISLEY, WHAN (MRN [id number]) as of 03/16/2019 10:17  Ref. Range 03/15/2019 17:13 03/15/2019 21:25 03/16/2019 05:37 03/16/2019 06:45 03/16/2019 07:12  Glucose-Capillary Latest Ref Range: 70 - 99 mg/dL 111 (H) 132 (H)   97    Radiology: 5/26: breech, AFI 12.5, bpp 8/8 5/7: efw 36%, 852gm, AC 43%, nl AFI and S/D dopplers  Assessment & Plan:  Pt stable *Pregnancy: d/w her re: btl (permanency and can do at time of c-section) and pt amenable to btl. Will sign papers today. Follow up NST for today.  -neg: 2h GTT *cHTN with superimposed severe pre-x: continue BP regimen. Repeat labs today fine with slightly up Cr. Repeat BPP and growth on 5/28.  -s/p Cards consult (signed off). Add isordil 20 tid PRN per last note.  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  Follow up repeat labs in 2-3 days -Neprohology signed off. Nothing to do currently. *DM2: Stable on no meds.  *Preterm: nothing to do -BMZ on 5/6 and 5/7. S/p NICU consult. S/p Mg on admission  *h/o c-section: would need c/s if stays breech *PPx: lovenox, oob ad lib *FEN/GI: DM2 diet.  *Dispo: inpatient until delivery. Hope to get to 34wks.   Durene Romans MD Attending Center for [facility])

## 2019-03-16 NOTE — Progress Notes (Signed)
BTL papers signed with patient  Durene Romans MD Attending Center for [facility] 03/16/2019 Time: 1206pm

## 2019-03-17 ENCOUNTER — Inpatient Hospital Stay (HOSPITAL_COMMUNITY): Payer: Medicaid Other

## 2019-03-17 DIAGNOSIS — O289 Unspecified abnormal findings on antenatal screening of mother: Secondary | ICD-10-CM

## 2019-03-17 DIAGNOSIS — Z362 Encounter for other antenatal screening follow-up: Secondary | ICD-10-CM

## 2019-03-17 DIAGNOSIS — O34219 Maternal care for unspecified type scar from previous cesarean delivery: Secondary | ICD-10-CM

## 2019-03-17 DIAGNOSIS — J45909 Unspecified asthma, uncomplicated: Secondary | ICD-10-CM

## 2019-03-17 DIAGNOSIS — O10913 Unspecified pre-existing hypertension complicating pregnancy, third trimester: Secondary | ICD-10-CM

## 2019-03-17 DIAGNOSIS — O24410 Gestational diabetes mellitus in pregnancy, diet controlled: Secondary | ICD-10-CM

## 2019-03-17 DIAGNOSIS — O3413 Maternal care for benign tumor of corpus uteri, third trimester: Secondary | ICD-10-CM

## 2019-03-17 DIAGNOSIS — O113 Pre-existing hypertension with pre-eclampsia, third trimester: Secondary | ICD-10-CM

## 2019-03-17 DIAGNOSIS — O99213 Obesity complicating pregnancy, third trimester: Secondary | ICD-10-CM

## 2019-03-17 DIAGNOSIS — D259 Leiomyoma of uterus, unspecified: Secondary | ICD-10-CM

## 2019-03-17 DIAGNOSIS — O9989 Other specified diseases and conditions complicating pregnancy, childbirth and the puerperium: Secondary | ICD-10-CM

## 2019-03-17 DIAGNOSIS — Z3A29 29 weeks gestation of pregnancy: Secondary | ICD-10-CM

## 2019-03-17 LAB — GLUCOSE, CAPILLARY
Glucose-Capillary: 92 mg/dL (ref 70–99)
Glucose-Capillary: 110 mg/dL — ABNORMAL HIGH (ref 70–99)
Glucose-Capillary: 116 mg/dL — ABNORMAL HIGH (ref 70–99)
Glucose-Capillary: 138 mg/dL — ABNORMAL HIGH (ref 70–99)

## 2019-03-17 MED ORDER — BETAMETHASONE SOD PHOS & ACET 6 (3-3) MG/ML IJ SUSP
12.0000 mg | INTRAMUSCULAR | Status: AC
  Administered 2019-03-17 – 2019-03-18 (×2): 12 mg via INTRAMUSCULAR
  Filled 2019-03-17 (×2): qty 2

## 2019-03-17 NOTE — Progress Notes (Addendum)
Daily Antepartum Note  Admission Date: 02/23/2019 Current Date: 03/17/2019 10:15 AM  Danielle Kent is a 33 y.o. G2P1001 @ [redacted]w[redacted]d, HD#23, admitted for severe pre-eclampsia (BPs) superimposed on chronic HTN.  Pregnancy complicated by: Patient Active Problem List   Diagnosis Date Noted  . Malpresentation before onset of labor 03/16/2019  . BMI 70 and over, adult ([place]) 03/15/2019  . Chronic renal insufficiency 03/15/2019  . Severe hypertension   . Chronic hypertension with superimposed preeclampsia 02/23/2019  . Preexisting diabetes complicating pregnancy, antepartum 12/27/2018  . Previous cesarean section 12/06/2018  . Chronic hypertension during pregnancy, antepartum 12/06/2018  . Maternal morbid obesity, antepartum ([place]) 12/06/2018  . Supervision of high risk pregnancy, antepartum 11/25/2018  . Hypertensive urgency 11/25/2018    Overnight/24hr events:  none  Subjective:  No s/s of pre-eclampsia, preterm labor or decreased FM  Objective:    Current Vital Signs 24h Vital Sign Ranges  T 98.1 F (36.7 C) Temp  Avg: 98.3 F (36.8 C)  Min: 98.1 F (36.7 C)  Max: 98.5 F (36.9 C)  BP (!) 159/93 BP  Min: 117/55  Max: 159/93  HR 78 Pulse  Avg: 82.6  Min: 78  Max: 92  RR 19 Resp  Avg: 19  Min: 18  Max: 20  SaO2 99 % Room Air SpO2  Avg: 97.9 %  Min: 97 %  Max: 100 %       24 Hour I/O Current Shift I/O  Time Ins Outs No intake/output data recorded. No intake/output data recorded.   Patient Vitals for the past 24 hrs:  BP Temp Temp src Pulse Resp SpO2  03/17/19 0823 - 98.1 F (36.7 C) Oral - 19 99 %  03/17/19 0638 (!) 159/93 98.5 F (36.9 C) Oral 78 20 98 %  03/17/19 0600 - - - - - 98 %  03/17/19 0509 - - - - - 100 %  03/17/19 0400 - - - - - 97 %  03/17/19 0345 - - - - - 98 %  03/17/19 0330 - - - - - 98 %  03/17/19 0300 - - - - - 97 %  03/17/19 0230 - - - - - 97 %  03/17/19 0215 - - - - - 97 %  03/17/19 0200 - - - - - 98 %  03/17/19 0045 (!) 153/93 - - 80 - 97 %  03/16/19  2215 (!) 117/55 98.3 F (36.8 C) Oral 92 20 -  03/16/19 1601 139/72 98.2 F (36.8 C) Oral 80 18 98 %  03/16/19 1139 127/75 98.3 F (36.8 C) Oral 83 18 98 %  03/16/19 1134 - - - - - 98 %  03/16/19 1129 - - - - - 98 %  03/16/19 1124 - - - - - 97 %  03/16/19 1119 - - - - - 97 %  03/16/19 1114 - - - - - 98 %  03/16/19 1109 - - - - - 99 %   FHR: 140 baseline, no accels or decel, mod variablity Toco: quiet   Physical exam: General: Well nourished, well developed female in no acute distress. Abdomen: obese, nttp, nd Cardiovascular: S1, S2 normal, no murmur, rub or gallop, regular rate and rhythm Respiratory: CTAB Extremities: no clubbing, cyanosis or edema Skin: Warm and dry.   Medications: Current Facility-Administered Medications  Medication Dose Route Frequency Provider Last Rate Last Dose  . acetaminophen (TYLENOL) tablet 650 mg  650 mg Oral Q4H PRN Tresea Mall, CNM      .  betamethasone acetate-betamethasone sodium phosphate (CELESTONE) injection 12 mg  12 mg Intramuscular Q24 Hr x 2 Zarea Diesing, MD      . calcium carbonate (TUMS - dosed in mg elemental calcium) chewable tablet 400 mg of elemental calcium  2 tablet Oral Q4H PRN Tresea Mall, CNM      . cloNIDine (CATAPRES) tablet 0.3 mg  0.3 mg Oral TID Sloan Leiter, MD   0.3 mg at 03/17/19 4008  . docusate sodium (COLACE) capsule 100 mg  100 mg Oral Daily Tresea Mall, CNM   100 mg at 03/17/19 6761  . enoxaparin (LOVENOX) injection 80 mg  80 mg Subcutaneous Q24H Sloan Leiter, MD   80 mg at 03/17/19 9509  . hydrALAZINE (APRESOLINE) injection 5 mg  5 mg Intravenous PRN Marcille Buffy D, CNM   5 mg at 03/11/19 1415   And  . hydrALAZINE (APRESOLINE) injection 10 mg  10 mg Intravenous PRN Marcille Buffy D, CNM   10 mg at 03/11/19 1432   And  . labetalol (NORMODYNE) injection 20 mg  20 mg Intravenous PRN Marcille Buffy D, CNM   20 mg at 03/11/19 1449   And  . labetalol (NORMODYNE) injection 40 mg  40 mg  Intravenous PRN Marcille Buffy D, CNM   40 mg at 03/02/19 2349  . hydrALAZINE (APRESOLINE) tablet 100 mg  100 mg Oral Q6H Barrett, Rhonda G, PA-C   100 mg at 03/17/19 0830  . hydrochlorothiazide (HYDRODIURIL) tablet 50 mg  50 mg Oral Daily Sloan Leiter, MD   50 mg at 03/17/19 0831  . labetalol (NORMODYNE) tablet 800 mg  800 mg Oral Q8H Sloan Leiter, MD   800 mg at 03/17/19 0818  . NIFEdipine (PROCARDIA-XL/NIFEDICAL-XL) 24 hr tablet 90 mg  90 mg Oral BID [name], MD   90 mg at 03/17/19 3267  . prenatal multivitamin tablet 1 tablet  1 tablet Oral Q1200 Marcille Buffy D, CNM   1 tablet at 03/16/19 1424  . sodium chloride flush (NS) 0.9 % injection 3 mL  3 mL Intravenous Q12H Chancy [place], MD   3 mL at 03/17/19 0046  . zolpidem (AMBIEN) tablet 5 mg  5 mg Oral QHS PRN Tresea Mall, CNM        Labs:  Recent Labs  Lab 03/13/19 0545 03/16/19 0645  WBC 8.6 8.0  HGB 11.8* 12.2  HCT 36.2 36.6  PLT 265 222    Recent Labs  Lab 03/13/19 0545 03/16/19 0645  NA 133* 137  K 3.9 4.1  CL 102 101  CO2 21* 24  BUN 24* 24*  CREATININE 1.20* 1.28*  CALCIUM 9.5 9.7  PROT 6.7 6.5  BILITOT 0.2* 0.2*  ALKPHOS 79 70  ALT 44 39  AST 24 21  GLUCOSE 137* 102*   Results for Danielle, Kent (MRN [id number]) as of 03/17/2019 10:16  Ref. Range 03/16/2019 07:12 03/16/2019 11:35 03/16/2019 16:58 03/16/2019 22:12 03/17/2019 07:00  Glucose-Capillary Latest Ref Range: 70 - 99 mg/dL 97 106 (H) 112 (H) 133 (H) 92    Radiology:  5/26: breech, AFI 12.5, bpp 8/8 5/7: efw 36%, 852gm, AC 43%, nl AFI and S/D dopplers  Assessment & Plan:  Pt stable *Pregnancy: Follow up NST for today. NST at baseline (category I)  -btl papers signed 5/27 -neg: 2h GTT *cHTN with superimposed severe pre-x: continue BP regimen. Repeat labs today fine with slightly up Cr. Repeat BPP and growth on 5/28.  -s/p Cards consult (  signed off). Add isordil 20 tid PRN per last note.  -11/2018 echo unremarkable *BMI 60s: anesthesia  aware *Renal: likely CRI.  Rpt labs tomorrow -Neprohology signed off. Nothing to do currently. *DM2: Stable on no meds.  *Preterm: will give rescue steroids today and tomorrow. Watch CBGs. -BMZ on 5/6 and 5/7. S/p NICU consult. S/p Mg on admission  *h/o c-section: would need c/s if stays breech *PPx: lovenox, oob ad lib *FEN/GI: DM2 diet.  *Dispo: inpatient until delivery. Hope to get to 34wks.   Durene Romans MD Attending Center for [facility])

## 2019-03-18 LAB — GLUCOSE, CAPILLARY
Glucose-Capillary: 98 mg/dL (ref 70–99)
Glucose-Capillary: 109 mg/dL — ABNORMAL HIGH (ref 70–99)
Glucose-Capillary: 175 mg/dL — ABNORMAL HIGH (ref 70–99)
Glucose-Capillary: 178 mg/dL — ABNORMAL HIGH (ref 70–99)

## 2019-03-18 NOTE — Progress Notes (Signed)
Dr. Elly Modena contacted in regards to patient's BP of 170/93. Instructed to give her PO meds this morning and recheck in one hour. Will continue to monitor. Toya Smothers, RN

## 2019-03-18 NOTE — Progress Notes (Addendum)
Daily Antepartum Note  Admission Date: 02/23/2019 Current Date: 03/18/2019 10:10 AM  Danielle Kent is a 33 y.o. G2P1001 @ [redacted]w[redacted]d, HD#24, admitted for severe pre-eclampsia (BPs) superimposed on chronic HTN.  Pregnancy complicated by: Patient Active Problem List   Diagnosis Date Noted  . Malpresentation before onset of labor 03/16/2019  . BMI 70 and over, adult ([place]) 03/15/2019  . Chronic renal insufficiency 03/15/2019  . Severe hypertension   . Chronic hypertension with superimposed preeclampsia 02/23/2019  . Preexisting diabetes complicating pregnancy, antepartum 12/27/2018  . Previous cesarean section 12/06/2018  . Chronic hypertension during pregnancy, antepartum 12/06/2018  . Maternal morbid obesity, antepartum ([place]) 12/06/2018  . Supervision of high risk pregnancy, antepartum 11/25/2018  . Hypertensive urgency 11/25/2018    Overnight/24hr events:  none  Subjective:  No s/s of pre-eclampsia, preterm labor or decreased FM  Objective:    Current Vital Signs 24h Vital Sign Ranges  T 98.5 F (36.9 C) Temp  Avg: 98.3 F (36.8 C)  Min: 98.1 F (36.7 C)  Max: 98.5 F (36.9 C)  BP (!) 144/78(RN NOTIFIED) BP  Min: 119/68  Max: 170/93  HR 87 Pulse  Avg: 84.1  Min: 78  Max: 90  RR 19 Resp  Avg: 18.2  Min: 18  Max: 19  SaO2 97 % Room Air SpO2  Avg: 97.8 %  Min: 96 %  Max: 100 %       24 Hour I/O Current Shift I/O  Time Ins Outs No intake/output data recorded. No intake/output data recorded.   Patient Vitals for the past 24 hrs:  BP Temp Temp src Pulse Resp SpO2  03/18/19 0916 (!) 144/78 - - 87 - 97 %  03/18/19 0837 (!) 165/88 - - 81 - -  03/18/19 0835 - - - - 19 -  03/18/19 0710 (!) 170/93 98.5 F (36.9 C) Oral 85 18 97 %  03/18/19 0709 - - - - - 97 %  03/18/19 0642 (!) 161/93 - - 82 - -  03/18/19 0609 - - - - - 97 %  03/18/19 0604 - - - - - 96 %  03/18/19 0559 - - - - - 97 %  03/18/19 0554 - - - - - 98 %  03/18/19 0549 - - - - - 98 %  03/18/19 0544 - - - - - 98 %   03/18/19 0539 - - - - - 98 %  03/18/19 0534 - - - - - 99 %  03/18/19 0530 - - - - - 98 %  03/18/19 0524 - - - - - 98 %  03/18/19 0519 - - - - - 98 %  03/18/19 0514 - - - - - 98 %  03/18/19 0510 - - - - - 98 %  03/18/19 0509 - - - - - 98 %  03/18/19 0504 - - - - - 98 %  03/18/19 0500 - - - - - 98 %  03/18/19 0430 - - - - - 97 %  03/18/19 0400 - - - - - 96 %  03/18/19 0330 - - - - - 97 %  03/18/19 0300 - - - - - 100 %  03/17/19 2353 (!) 148/80 98.4 F (36.9 C) Oral 90 18 100 %  03/17/19 2129 (!) 168/82 98.2 F (36.8 C) Oral 90 18 98 %  03/17/19 1607 119/68 98.1 F (36.7 C) Oral 80 18 98 %  03/17/19 1144 133/77 98.1 F (36.7 C) Oral 78 18 99 %  FHR: 140 baseline, ?one accel, one slight episodic decel, mod variablity Toco: quiet   Physical exam: General: Well nourished, well developed female in no acute distress. Abdomen: obese, nttp, nd Cardiovascular: S1, S2 normal, no murmur, rub or gallop, regular rate and rhythm Respiratory: CTAB Extremities: no clubbing, cyanosis or edema Skin: Warm and dry.   Medications: Current Facility-Administered Medications  Medication Dose Route Frequency Provider Last Rate Last Dose  . acetaminophen (TYLENOL) tablet 650 mg  650 mg Oral Q4H PRN Tresea Mall, CNM      . calcium carbonate (TUMS - dosed in mg elemental calcium) chewable tablet 400 mg of elemental calcium  2 tablet Oral Q4H PRN Tresea Mall, CNM      . cloNIDine (CATAPRES) tablet 0.3 mg  0.3 mg Oral TID Sloan Leiter, MD   0.3 mg at 03/18/19 7322  . docusate sodium (COLACE) capsule 100 mg  100 mg Oral Daily Tresea Mall, CNM   100 mg at 03/17/19 0254  . enoxaparin (LOVENOX) injection 80 mg  80 mg Subcutaneous Q24H Sloan Leiter, MD   80 mg at 03/18/19 2706  . hydrALAZINE (APRESOLINE) injection 5 mg  5 mg Intravenous PRN Marcille Buffy D, CNM   5 mg at 03/11/19 1415   And  . hydrALAZINE (APRESOLINE) injection 10 mg  10 mg Intravenous PRN Marcille Buffy D, CNM   10 mg  at 03/11/19 1432   And  . labetalol (NORMODYNE) injection 20 mg  20 mg Intravenous PRN Marcille Buffy D, CNM   20 mg at 03/11/19 1449   And  . labetalol (NORMODYNE) injection 40 mg  40 mg Intravenous PRN Marcille Buffy D, CNM   40 mg at 03/02/19 2349  . hydrALAZINE (APRESOLINE) tablet 100 mg  100 mg Oral Q6H Barrett, Rhonda G, PA-C   100 mg at 03/18/19 0736  . hydrochlorothiazide (HYDRODIURIL) tablet 50 mg  50 mg Oral Daily Sloan Leiter, MD   50 mg at 03/18/19 1007  . labetalol (NORMODYNE) tablet 800 mg  800 mg Oral Q8H Sloan Leiter, MD   800 mg at 03/18/19 0736  . NIFEdipine (PROCARDIA-XL/NIFEDICAL-XL) 24 hr tablet 90 mg  90 mg Oral BID [name], MD   90 mg at 03/18/19 1007  . prenatal multivitamin tablet 1 tablet  1 tablet Oral Q1200 Marcille Buffy D, CNM   1 tablet at 03/17/19 1200  . sodium chloride flush (NS) 0.9 % injection 3 mL  3 mL Intravenous Q12H Chancy [place], MD   3 mL at 03/17/19 2257  . zolpidem (AMBIEN) tablet 5 mg  5 mg Oral QHS PRN Tresea Mall, CNM        Labs:  Recent Labs  Lab 03/13/19 0545 03/16/19 0645  WBC 8.6 8.0  HGB 11.8* 12.2  HCT 36.2 36.6  PLT 265 222    Recent Labs  Lab 03/13/19 0545 03/16/19 0645  NA 133* 137  K 3.9 4.1  CL 102 101  CO2 21* 24  BUN 24* 24*  CREATININE 1.20* 1.28*  CALCIUM 9.5 9.7  PROT 6.7 6.5  BILITOT 0.2* 0.2*  ALKPHOS 79 70  ALT 44 39  AST 24 21  GLUCOSE 137* 102*   Results for LOUISE, RAWSON (MRN [id number]) as of 03/18/2019 10:10  Ref. Range 03/17/2019 10:55 03/17/2019 11:25 03/17/2019 16:49 03/17/2019 22:59 03/18/2019 06:14  Glucose-Capillary Latest Ref Range: 70 - 99 mg/dL 110 (H)  116 (H) 138 (H) 98    Radiology:  5/28: breech, efw 39%, 1305gm, ac 31%, afi 12.5, bpp 8/8 5/26: breech, AFI 12.5, bpp 8/8 5/7: efw 36%, 852gm, AC 43%, nl AFI and S/D dopplers  Assessment & Plan:  Pt stable *Pregnancy: Follow up NST for today.  -btl papers signed 5/27 -neg: 2h GTT *cHTN with superimposed severe pre-x:  continue BP regimen. Repeat labs 5/30 -s/p Cards consult (signed off). Add isordil 20 tid PRN per last note.  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  Rpt labs 5/30 -Neprohology signed off. Nothing to do currently. *DM2: Stable on no meds.  *Preterm: will give rescue steroids 5/28 and 5/29. Watch CBGs. -BMZ on 5/6 and 5/7. S/p NICU consult. S/p Mg on admission  *h/o c-section: would need c/s if stays breech *PPx: lovenox, oob ad lib *FEN/GI: DM2 diet.  *Dispo: inpatient until delivery. Hope to get to 34wks.   Durene Romans MD Attending Center for [facility])

## 2019-03-18 NOTE — Progress Notes (Signed)
Patient remains in bathroom. Trying to medicate the patient for her BP of 170/93. Toya Smothers, RN

## 2019-03-19 LAB — CBC
HCT: 35.4 % — ABNORMAL LOW (ref 36.0–46.0)
Hemoglobin: 11.7 g/dL — ABNORMAL LOW (ref 12.0–15.0)
MCH: 28.1 pg (ref 26.0–34.0)
MCHC: 33.1 g/dL (ref 30.0–36.0)
MCV: 85.1 fL (ref 80.0–100.0)
Platelets: 280 10*3/uL (ref 150–400)
RBC: 4.16 MIL/uL (ref 3.87–5.11)
RDW: 13.8 % (ref 11.5–15.5)
WBC: 12.3 10*3/uL — ABNORMAL HIGH (ref 4.0–10.5)
nRBC: 0.2 % (ref 0.0–0.2)

## 2019-03-19 LAB — COMPREHENSIVE METABOLIC PANEL
ALT: 63 U/L — ABNORMAL HIGH (ref 0–44)
AST: 29 U/L (ref 15–41)
Albumin: 2.8 g/dL — ABNORMAL LOW (ref 3.5–5.0)
Alkaline Phosphatase: 75 U/L (ref 38–126)
Anion gap: 13 (ref 5–15)
BUN: 29 mg/dL — ABNORMAL HIGH (ref 6–20)
CO2: 23 mmol/L (ref 22–32)
Calcium: 9.7 mg/dL (ref 8.9–10.3)
Chloride: 98 mmol/L (ref 98–111)
Creatinine, Ser: 1.33 mg/dL — ABNORMAL HIGH (ref 0.44–1.00)
GFR calc Af Amer: >60 mL/min (ref >60)
GFR calc non Af Amer: 52 mL/min — ABNORMAL LOW (ref >60)
Glucose, Bld: 120 mg/dL — ABNORMAL HIGH (ref 70–99)
Potassium: 4.1 mmol/L (ref 3.5–5.1)
Sodium: 134 mmol/L — ABNORMAL LOW (ref 135–145)
Total Bilirubin: 0.2 mg/dL — ABNORMAL LOW (ref 0.3–1.2)
Total Protein: 6.7 g/dL (ref 6.5–8.1)

## 2019-03-19 LAB — GLUCOSE, CAPILLARY
Glucose-Capillary: 110 mg/dL — ABNORMAL HIGH (ref 70–99)
Glucose-Capillary: 105 mg/dL — ABNORMAL HIGH (ref 70–99)
Glucose-Capillary: 136 mg/dL — ABNORMAL HIGH (ref 70–99)
Glucose-Capillary: 116 mg/dL — ABNORMAL HIGH (ref 70–99)

## 2019-03-19 LAB — TYPE AND SCREEN
ABO/RH(D): O POS
Antibody Screen: NEGATIVE

## 2019-03-19 NOTE — Progress Notes (Signed)
Daily Antepartum Note  Admission Date: 02/23/2019 Current Date: 03/19/2019 9:51 AM  Epiphany Boultinghouse is a 33 y.o. G2P1001 @ [redacted]w[redacted]d, HD#25, admitted for severe pre-eclampsia (BPs) superimposed on chronic HTN.  Pregnancy complicated by: Patient Active Problem List   Diagnosis Date Noted  . Malpresentation before onset of labor 03/16/2019  . BMI 70 and over, adult ([place]) 03/15/2019  . Chronic renal insufficiency 03/15/2019  . Severe hypertension   . Chronic hypertension with superimposed preeclampsia 02/23/2019  . Preexisting diabetes complicating pregnancy, antepartum 12/27/2018  . Previous cesarean section 12/06/2018  . Chronic hypertension during pregnancy, antepartum 12/06/2018  . Maternal morbid obesity, antepartum ([place]) 12/06/2018  . Supervision of high risk pregnancy, antepartum 11/25/2018  . Hypertensive urgency 11/25/2018    Overnight/24hr events:  none  Subjective:  No s/s of pre-eclampsia, preterm labor or decreased FM  Objective:    Current Vital Signs 24h Vital Sign Ranges  T 98.2 F (36.8 C) Temp  Avg: 98.1 F (36.7 C)  Min: 97.6 F (36.4 C)  Max: 98.5 F (36.9 C)  BP (!) 156/86 BP  Min: 133/82  Max: 168/99  HR 80 Pulse  Avg: 85.6  Min: 80  Max: 91  RR 18 Resp  Avg: 17.8  Min: 17  Max: 18  SaO2 98 % Room Air SpO2  Avg: 97.6 %  Min: 91 %  Max: 100 %       24 Hour I/O Current Shift I/O  Time Ins Outs No intake/output data recorded. No intake/output data recorded.   Patient Vitals for the past 24 hrs:  BP Temp Temp src Pulse Resp SpO2  03/19/19 0856 (!) 156/86 98.2 F (36.8 C) Oral 80 18 98 %  03/19/19 0624 - - - - - 97 %  03/19/19 0619 - - - - - 97 %  03/19/19 0614 - - - - - 98 %  03/19/19 0609 - - - - - 98 %  03/19/19 0604 - - - - - 98 %  03/19/19 0559 - - - - - 97 %  03/19/19 0554 - - - - - 97 %  03/19/19 0549 - - - - - 91 %  03/19/19 0544 - - - - - 97 %  03/19/19 0539 - - - - - 97 %  03/19/19 0534 - - - - - 97 %  03/19/19 0529 - - - - - 97 %  03/19/19  0524 - - - - - 97 %  03/19/19 0519 - - - - - 97 %  03/19/19 0514 - - - - - 97 %  03/19/19 0509 - - - - - 97 %  03/19/19 0504 - - - - - 97 %  03/19/19 0459 - - - - - 98 %  03/19/19 0454 - - - - - 98 %  03/19/19 0449 - - - - - 98 %  03/19/19 0444 - - - - - 98 %  03/19/19 0439 - - - - - 98 %  03/19/19 0434 - - - - - 98 %  03/19/19 0431 (!) 163/103 97.6 F (36.4 C) Oral 82 18 99 %  03/19/19 0429 - - - - - 98 %  03/19/19 0424 - - - - - 97 %  03/19/19 0419 - - - - - 98 %  03/19/19 0414 - - - - - 97 %  03/19/19 0409 - - - - - 97 %  03/19/19 0404 - - - - - 97 %  03/19/19 0359 - - - - -  97 %  03/19/19 0354 - - - - - 97 %  03/19/19 0349 - - - - - 97 %  03/19/19 0344 - - - - - 97 %  03/19/19 0339 - - - - - 97 %  03/19/19 0334 - - - - - 97 %  03/19/19 0329 - - - - - 96 %  03/19/19 0324 - - - - - 97 %  03/19/19 0319 - - - - - 97 %  03/19/19 0314 - - - - - 97 %  03/19/19 0309 - - - - - 95 %  03/19/19 0304 - - - - - 95 %  03/19/19 0259 - - - - - 95 %  03/19/19 0254 - - - - - 96 %  03/19/19 0249 - - - - - 96 %  03/19/19 0244 - - - - - 96 %  03/19/19 0239 - - - - - 96 %  03/19/19 0234 - - - - - 96 %  03/19/19 0229 - - - - - 98 %  03/19/19 0224 - - - - - 99 %  03/19/19 0219 - - - - - 99 %  03/19/19 0214 - - - - - 99 %  03/19/19 0209 - - - - - 99 %  03/19/19 0204 - - - - - 99 %  03/19/19 0200 - - - - - 99 %  03/19/19 0154 - - - - - 99 %  03/19/19 0149 - - - - - 99 %  03/19/19 0144 - - - - - 100 %  03/19/19 0139 - - - - - 99 %  03/19/19 0134 - - - - - 98 %  03/19/19 0130 - - - - - 99 %  03/19/19 0124 - - - - - 98 %  03/19/19 0119 - - - - - 99 %  03/19/19 0114 - - - - - 99 %  03/19/19 0109 - - - - - 100 %  03/19/19 0044 - - - - - 99 %  03/19/19 0039 - - - - - 99 %  03/19/19 0034 - - - - - 99 %  03/19/19 0029 - - - - - 99 %  03/19/19 0019 - - - - - 98 %  03/19/19 0014 - - - - - 98 %  03/19/19 0009 - - - - - 97 %  03/19/19 0004 - - - - - 98 %  03/18/19 2344 (!) 149/89 98.5 F  (36.9 C) Oral 89 18 97 %  03/18/19 1952 (!) 156/94 - - 87 - -  03/18/19 1937 (!) 168/99 97.8 F (36.6 C) Oral 91 18 99 %  03/18/19 1604 (!) 142/75 98.2 F (36.8 C) Oral 87 17 96 %  03/18/19 1132 133/82 98.3 F (36.8 C) Oral 83 18 99 %   FHR: 140 baseline, no accel, no decel, mod variablity Toco: quiet   Physical exam: General: Well nourished, well developed female in no acute distress. Abdomen: obese, nttp, nd Cardiovascular: S1, S2 normal, no murmur, rub or gallop, regular rate and rhythm Respiratory: CTAB Extremities: no clubbing, cyanosis or edema Skin: Warm and dry.   Medications: Current Facility-Administered Medications  Medication Dose Route Frequency Provider Last Rate Last Dose  . acetaminophen (TYLENOL) tablet 650 mg  650 mg Oral Q4H PRN Tresea Mall, CNM      . calcium carbonate (TUMS - dosed in mg elemental calcium) chewable tablet 400 mg of elemental calcium  2 tablet Oral Q4H PRN Tresea Mall, CNM      . cloNIDine (CATAPRES) tablet 0.3 mg  0.3 mg Oral TID Sloan Leiter, MD   0.3 mg at 03/19/19 7673  . docusate sodium (COLACE) capsule 100 mg  100 mg Oral Daily Tresea Mall, CNM   100 mg at 03/17/19 4193  . enoxaparin (LOVENOX) injection 80 mg  80 mg Subcutaneous Q24H Sloan Leiter, MD   80 mg at 03/19/19 7902  . hydrALAZINE (APRESOLINE) injection 5 mg  5 mg Intravenous PRN Marcille Buffy D, CNM   5 mg at 03/11/19 1415   And  . hydrALAZINE (APRESOLINE) injection 10 mg  10 mg Intravenous PRN Marcille Buffy D, CNM   10 mg at 03/11/19 1432   And  . labetalol (NORMODYNE) injection 20 mg  20 mg Intravenous PRN Marcille Buffy D, CNM   20 mg at 03/11/19 1449   And  . labetalol (NORMODYNE) injection 40 mg  40 mg Intravenous PRN Marcille Buffy D, CNM   40 mg at 03/02/19 2349  . hydrALAZINE (APRESOLINE) tablet 100 mg  100 mg Oral Q6H Barrett, Rhonda G, PA-C   100 mg at 03/19/19 0853  . hydrochlorothiazide (HYDRODIURIL) tablet 50 mg  50 mg Oral Daily Sloan Leiter, MD   50 mg at 03/18/19 1007  . labetalol (NORMODYNE) tablet 800 mg  800 mg Oral Q8H Sloan Leiter, MD   800 mg at 03/19/19 4097  . NIFEdipine (PROCARDIA-XL/NIFEDICAL-XL) 24 hr tablet 90 mg  90 mg Oral BID [name], MD   90 mg at 03/18/19 2133  . prenatal multivitamin tablet 1 tablet  1 tablet Oral Q1200 Marcille Buffy D, CNM   1 tablet at 03/18/19 1200  . sodium chloride flush (NS) 0.9 % injection 3 mL  3 mL Intravenous Q12H Chancy Milroy, MD   3 mL at 03/18/19 2133  . zolpidem (AMBIEN) tablet 5 mg  5 mg Oral QHS PRN Tresea Mall, CNM        Labs:  Recent Labs  Lab 03/13/19 [phone] 03/16/19 0645 03/19/19 0723  WBC 8.6 8.0 12.3*  HGB 11.8* 12.2 11.7*  HCT 36.2 36.6 35.4*  PLT 265 222 280    Recent Labs  Lab 03/13/19 0545 03/16/19 0645 03/19/19 0723  NA 133* 137 134*  K 3.9 4.1 4.1  CL 102 101 98  CO2 21* 24 23  BUN 24* 24* 29*  CREATININE 1.20* 1.28* 1.33*  CALCIUM 9.5 9.7 9.7  PROT 6.7 6.5 6.7  BILITOT 0.2* 0.2* 0.2*  ALKPHOS 79 70 75  ALT 44 39 63*  AST 24 21 29   GLUCOSE 137* 102* 120*   Results for ANNABELLA, ELFORD (MRN [id number]) as of 03/19/2019 09:53  Ref. Range 03/18/2019 11:29 03/18/2019 17:33 03/18/2019 21:56 03/19/2019 06:30  Glucose-Capillary Latest Ref Range: 70 - 99 mg/dL 109 (H) 175 (H) 178 (H) 110 (H)   Radiology:  5/28: breech, efw 39%, 1305gm, ac 31%, afi 12.5, bpp 8/8 5/26: breech, AFI 12.5, bpp 8/8 5/7: efw 36%, 852gm, AC 43%, nl AFI and S/D dopplers  Assessment & Plan:  Pt stable *Pregnancy: Follow up NST for today.  -btl papers signed 5/27 -neg: 2h GTT *cHTN with superimposed severe pre-x: continue BP regimen. Repeat labs tomorrow. Will make npo after MN -s/p Cards consult (signed off). Add isordil 20 tid PRN per last note.  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  See above -Neprohology  signed off. Nothing to do currently. *DM2: Stable on no meds.  *Preterm: s/p rescue steroids 5/28 and 5/29. Watch  CBGs. -BMZ on 5/6 and 5/7. S/p NICU consult. S/p Mg on admission  *h/o c-section: would need c/s if stays breech *PPx: lovenox, oob ad lib *FEN/GI: DM2 diet. Npo after MN  *Dispo: inpatient until delivery. Hope to get to 34wks.   Durene Romans MD Attending Center for [facility])

## 2019-03-20 ENCOUNTER — Inpatient Hospital Stay (HOSPITAL_COMMUNITY): Payer: Medicaid Other

## 2019-03-20 DIAGNOSIS — J45909 Unspecified asthma, uncomplicated: Secondary | ICD-10-CM

## 2019-03-20 DIAGNOSIS — O9989 Other specified diseases and conditions complicating pregnancy, childbirth and the puerperium: Secondary | ICD-10-CM

## 2019-03-20 DIAGNOSIS — Z3A30 30 weeks gestation of pregnancy: Secondary | ICD-10-CM

## 2019-03-20 DIAGNOSIS — O10913 Unspecified pre-existing hypertension complicating pregnancy, third trimester: Secondary | ICD-10-CM

## 2019-03-20 DIAGNOSIS — D259 Leiomyoma of uterus, unspecified: Secondary | ICD-10-CM

## 2019-03-20 DIAGNOSIS — O3413 Maternal care for benign tumor of corpus uteri, third trimester: Secondary | ICD-10-CM

## 2019-03-20 DIAGNOSIS — O289 Unspecified abnormal findings on antenatal screening of mother: Secondary | ICD-10-CM

## 2019-03-20 DIAGNOSIS — O99213 Obesity complicating pregnancy, third trimester: Secondary | ICD-10-CM

## 2019-03-20 DIAGNOSIS — O34219 Maternal care for unspecified type scar from previous cesarean delivery: Secondary | ICD-10-CM

## 2019-03-20 DIAGNOSIS — O1413 Severe pre-eclampsia, third trimester: Secondary | ICD-10-CM

## 2019-03-20 DIAGNOSIS — O113 Pre-existing hypertension with pre-eclampsia, third trimester: Secondary | ICD-10-CM

## 2019-03-20 DIAGNOSIS — O24410 Gestational diabetes mellitus in pregnancy, diet controlled: Secondary | ICD-10-CM

## 2019-03-20 LAB — COMPREHENSIVE METABOLIC PANEL
ALT: 57 U/L — ABNORMAL HIGH (ref 0–44)
AST: 26 U/L (ref 15–41)
Albumin: 2.8 g/dL — ABNORMAL LOW (ref 3.5–5.0)
Alkaline Phosphatase: 71 U/L (ref 38–126)
Anion gap: 11 (ref 5–15)
BUN: 25 mg/dL — ABNORMAL HIGH (ref 6–20)
CO2: 22 mmol/L (ref 22–32)
Calcium: 9.4 mg/dL (ref 8.9–10.3)
Chloride: 101 mmol/L (ref 98–111)
Creatinine, Ser: 1.21 mg/dL — ABNORMAL HIGH (ref 0.44–1.00)
GFR calc Af Amer: >60 mL/min (ref >60)
GFR calc non Af Amer: 59 mL/min — ABNORMAL LOW (ref >60)
Glucose, Bld: 101 mg/dL — ABNORMAL HIGH (ref 70–99)
Potassium: 4 mmol/L (ref 3.5–5.1)
Sodium: 134 mmol/L — ABNORMAL LOW (ref 135–145)
Total Bilirubin: 0.3 mg/dL (ref 0.3–1.2)
Total Protein: 6.6 g/dL (ref 6.5–8.1)

## 2019-03-20 LAB — GLUCOSE, CAPILLARY
Glucose-Capillary: 107 mg/dL — ABNORMAL HIGH (ref 70–99)
Glucose-Capillary: 90 mg/dL (ref 70–99)
Glucose-Capillary: 156 mg/dL — ABNORMAL HIGH (ref 70–99)
Glucose-Capillary: 124 mg/dL — ABNORMAL HIGH (ref 70–99)

## 2019-03-20 LAB — CBC
HCT: 37.3 % (ref 36.0–46.0)
Hemoglobin: 12.1 g/dL (ref 12.0–15.0)
MCH: 28.2 pg (ref 26.0–34.0)
MCHC: 32.4 g/dL (ref 30.0–36.0)
MCV: 86.9 fL (ref 80.0–100.0)
Platelets: 278 10*3/uL (ref 150–400)
RBC: 4.29 MIL/uL (ref 3.87–5.11)
RDW: 13.9 % (ref 11.5–15.5)
WBC: 11.8 10*3/uL — ABNORMAL HIGH (ref 4.0–10.5)
nRBC: 0 % (ref 0.0–0.2)

## 2019-03-20 MED ORDER — LACTATED RINGERS IV BOLUS
1000.0000 mL | Freq: Once | INTRAVENOUS | Status: AC
  Administered 2019-03-20: 1000 mL via INTRAVENOUS

## 2019-03-20 MED ORDER — ENOXAPARIN SODIUM 80 MG/0.8ML ~~LOC~~ SOLN
0.5000 mg/kg | SUBCUTANEOUS | Status: AC
  Administered 2019-03-20 – 2019-04-01 (×12): 80 mg via SUBCUTANEOUS
  Filled 2019-03-20 (×12): qty 0.8

## 2019-03-20 NOTE — Progress Notes (Addendum)
Daily Antepartum Note  Admission Date: 02/23/2019 Current Date: 03/20/2019 7:40 AM  Danielle Kent is a 33 y.o. G2P1001 @ [redacted]w[redacted]d, HD#26, admitted for severe pre-eclampsia (BPs) superimposed on chronic HTN.  Pregnancy complicated by: Patient Active Problem List   Diagnosis Date Noted  . Malpresentation before onset of labor 03/16/2019  . BMI 70 and over, adult ([place]) 03/15/2019  . Chronic renal insufficiency 03/15/2019  . Severe hypertension   . Chronic hypertension with superimposed preeclampsia 02/23/2019  . Preexisting diabetes complicating pregnancy, antepartum 12/27/2018  . Previous cesarean section 12/06/2018  . Chronic hypertension during pregnancy, antepartum 12/06/2018  . Maternal morbid obesity, antepartum ([place]) 12/06/2018  . Supervision of high risk pregnancy, antepartum 11/25/2018  . Hypertensive urgency 11/25/2018    Overnight/24hr events:  AM clonidine given early  Subjective:  No s/s of pre-eclampsia, preterm labor or decreased FM  Objective:    Current Vital Signs 24h Vital Sign Ranges  T 98.5 F (36.9 C) Temp  Avg: 98.4 F (36.9 C)  Min: 98.2 F (36.8 C)  Max: 98.6 F (37 C)  BP (!) 159/96 BP  Min: 134/88  Max: 176/101  HR 77 Pulse  Avg: 81.3  Min: 75  Max: 89  RR 16 Resp  Avg: 18  Min: 16  Max: 20  SaO2 98 % Room Air SpO2  Avg: 97.6 %  Min: 96 %  Max: 100 %       24 Hour I/O Current Shift I/O  Time Ins Outs No intake/output data recorded. No intake/output data recorded.   Patient Vitals for the past 24 hrs:  BP Temp Temp src Pulse Resp SpO2  03/20/19 0625 - - - - - 98 %  03/20/19 0620 - - - - - 97 %  03/20/19 0619 (!) 159/96 - - 77 - -  03/20/19 0615 - - - - - 99 %  03/20/19 0610 - - - - - 99 %  03/20/19 0605 - - - - - 100 %  03/20/19 0600 - - - - - 99 %  03/20/19 0555 - - - - - 99 %  03/20/19 0550 - - - - - 99 %  03/20/19 0545 - - - - - 99 %  03/20/19 0540 - - - - - 99 %  03/20/19 0535 - - - - - 99 %  03/20/19 0532 (!) 176/101 - - 75 - -   03/20/19 0515 - - - - - 98 %  03/20/19 0512 (!) 168/100 - - 76 16 99 %  03/20/19 0510 - - - - - 98 %  03/20/19 0505 - - - - - 97 %  03/20/19 0500 - - - - - 97 %  03/20/19 0455 - - - - - 97 %  03/20/19 0450 - - - - - 98 %  03/20/19 0445 - - - - - 98 %  03/20/19 0440 - - - - - 98 %  03/20/19 0435 - - - - - 98 %  03/20/19 0430 - - - - - 97 %  03/20/19 0425 - - - - - 98 %  03/20/19 0420 - - - - - 97 %  03/20/19 0415 - - - - - 97 %  03/20/19 0410 - - - - - 97 %  03/20/19 0405 - - - - - 97 %  03/20/19 0400 - - - - - 97 %  03/20/19 0355 - - - - - 97 %  03/20/19 0350 - - - - -  97 %  03/20/19 0345 - - - - - 96 %  03/20/19 0340 - - - - - 97 %  03/20/19 0335 - - - - - 97 %  03/20/19 0330 - - - - - 97 %  03/20/19 0325 - - - - - 97 %  03/20/19 0320 - - - - - 97 %  03/20/19 0315 - - - - - 97 %  03/20/19 0310 - - - - - 97 %  03/20/19 0305 - - - - - 97 %  03/20/19 0300 - - - - - 97 %  03/20/19 0255 - - - - - 97 %  03/20/19 0250 - - - - - 97 %  03/20/19 0245 - - - - - 97 %  03/20/19 0240 - - - - - 97 %  03/20/19 0235 - - - - - 97 %  03/20/19 0230 - - - - - 97 %  03/20/19 0225 - - - - - 97 %  03/20/19 0220 - - - - - 97 %  03/20/19 0020 (!) 143/83 98.5 F (36.9 C) Oral 88 18 98 %  03/19/19 2030 (!) 146/93 98.6 F (37 C) Oral 88 20 100 %  03/19/19 1701 (!) 144/92 98.2 F (36.8 C) Oral 89 - -  03/19/19 1200 134/88 98.3 F (36.8 C) Oral 77 18 -  03/19/19 0856 (!) 156/86 98.2 F (36.8 C) Oral 80 18 98 %   FHR: 135 baseline, no accel, no decel, mod variablity Toco: quiet   Physical exam: General: Well nourished, well developed female in no acute distress. Abdomen: obese, nttp, nd Cardiovascular: S1, S2 normal, no murmur, rub or gallop, regular rate and rhythm Respiratory: CTAB Extremities: no clubbing, cyanosis or edema Skin: Warm and dry.   Medications: Current Facility-Administered Medications  Medication Dose Route Frequency Provider Last Rate Last Dose  . acetaminophen  (TYLENOL) tablet 650 mg  650 mg Oral Q4H PRN Tresea Mall, CNM      . calcium carbonate (TUMS - dosed in mg elemental calcium) chewable tablet 400 mg of elemental calcium  2 tablet Oral Q4H PRN Tresea Mall, CNM      . cloNIDine (CATAPRES) tablet 0.3 mg  0.3 mg Oral TID Sloan Leiter, MD   0.3 mg at 03/20/19 0542  . docusate sodium (COLACE) capsule 100 mg  100 mg Oral Daily Tresea Mall, CNM   100 mg at 03/17/19 2952  . hydrALAZINE (APRESOLINE) injection 5 mg  5 mg Intravenous PRN Marcille Buffy D, CNM   5 mg at 03/11/19 1415   And  . hydrALAZINE (APRESOLINE) injection 10 mg  10 mg Intravenous PRN Marcille Buffy D, CNM   10 mg at 03/11/19 1432   And  . labetalol (NORMODYNE) injection 20 mg  20 mg Intravenous PRN Marcille Buffy D, CNM   20 mg at 03/11/19 1449   And  . labetalol (NORMODYNE) injection 40 mg  40 mg Intravenous PRN Marcille Buffy D, CNM   40 mg at 03/02/19 2349  . hydrALAZINE (APRESOLINE) tablet 100 mg  100 mg Oral Q6H Barrett, Rhonda G, PA-C   100 mg at 03/20/19 8413  . hydrochlorothiazide (HYDRODIURIL) tablet 50 mg  50 mg Oral Daily Sloan Leiter, MD   50 mg at 03/19/19 1049  . labetalol (NORMODYNE) tablet 800 mg  800 mg Oral Q8H Sloan Leiter, MD   800 mg at 03/19/19 2307  . NIFEdipine (PROCARDIA-XL/NIFEDICAL-XL) 24 hr tablet 90  mg  90 mg Oral BID [name], MD   90 mg at 03/19/19 2141  . prenatal multivitamin tablet 1 tablet  1 tablet Oral Q1200 Marcille Buffy D, CNM   1 tablet at 03/19/19 1144  . sodium chloride flush (NS) 0.9 % injection 3 mL  3 mL Intravenous Q12H Chancy Milroy, MD   3 mL at 03/19/19 2144  . zolpidem (AMBIEN) tablet 5 mg  5 mg Oral QHS PRN Tresea Mall, CNM        Labs:  Recent Labs  Lab 03/16/19 0645 03/19/19 0723 03/20/19 0515  WBC 8.0 12.3* 11.8*  HGB 12.2 11.7* 12.1  HCT 36.6 35.4* 37.3  PLT 222 280 278    Recent Labs  Lab 03/16/19 0645 03/19/19 0723 03/20/19 0515  NA 137 134* 134*  K 4.1 4.1 4.0  CL 101 98 101   CO2 24 23 22   BUN 24* 29* 25*  CREATININE 1.28* 1.33* 1.21*  CALCIUM 9.7 9.7 9.4  PROT 6.5 6.7 6.6  BILITOT 0.2* 0.2* 0.3  ALKPHOS 70 75 71  ALT 39 63* 57*  AST 21 29 26   GLUCOSE 102* 120* 101*   Results for SHAMYA, MACFADDEN (MRN [id number]) as of 03/20/2019 07:42  Ref. Range 03/19/2019 06:30 03/19/2019 11:42 03/19/2019 17:08 03/19/2019 21:41 03/20/2019 05:09  Glucose-Capillary Latest Ref Range: 70 - 99 mg/dL 110 (H) 105 (H) 136 (H) 116 (H) 107 (H)   Radiology:  5/28: breech, efw 39%, 1305gm, ac 31%, afi 12.5, bpp 8/8 5/26: breech, AFI 12.5, bpp 8/8 5/7: efw 36%, 852gm, AC 43%, nl AFI and S/D dopplers  Assessment & Plan:  Pt stable *Pregnancy: NST at baseline. Will get bpp today and probably would benefit from getting them 2x/week and not qwk.  -btl papers signed 5/27 -neg: 2h GTT *cHTN with superimposed severe pre-x: continue BP regimen. Repeat labs today reassuring -s/p Cards consult (signed off). Add isordil 20 tid PRN per last note.  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  Improved Cr today -Neprohology signed off. Nothing to do currently. *DM2: Stable on no meds.  *Preterm: s/p rescue steroids 5/28 and 5/29. Watch CBGs. -BMZ on 5/6 and 5/7. S/p NICU consult. S/p Mg on admission  *h/o c-section: would need c/s if stays breech *PPx: lovenox, oob ad lib *FEN/GI: DM2 diet.  *Dispo: inpatient until delivery. Hope to get to 34wks.   Durene Romans MD Attending Center for [facility])

## 2019-03-20 NOTE — Progress Notes (Signed)
ANTICOAGULATION CONSULT NOTE - Initial Consult  Pharmacy Consult for lovenox Indication: VTE prophylaxis  No Known Allergies  Patient Measurements: Height: 5' (152.4 cm) Weight: (!) 354 lb 11.5 oz (160.9 kg) IBW/kg (Calculated) : 45.5kg BMI = 69  Vital Signs: Temp: 98.2 F (36.8 C) (05/31 0808) Temp Source: Oral (05/31 0808) BP: 143/85 (05/31 0808) Pulse Rate: 85 (05/31 0808)  Labs: Recent Labs    03/19/19 0723 03/20/19 0515  HGB 11.7* 12.1  HCT 35.4* 37.3  PLT 280 278  CREATININE 1.33* 1.21*    Estimated Creatinine Clearance: 95.7 mL/min (A) (by C-G formula based on SCr of 1.21 mg/dL (H)).   Medical History: Past Medical History:  Diagnosis Date  . Asthma   . Gestational diabetes 2020  . Hypertension     Medications:  Scheduled:  . cloNIDine  0.3 mg Oral TID  . docusate sodium  100 mg Oral Daily  . enoxaparin (LOVENOX) injection  0.5 mg/kg Subcutaneous Q24H  . hydrALAZINE  100 mg Oral Q6H  . hydrochlorothiazide  50 mg Oral Daily  . labetalol  800 mg Oral Q8H  . NIFEdipine  90 mg Oral BID  . prenatal multivitamin  1 tablet Oral Q1200  . sodium chloride flush  3 mL Intravenous Q12H    Assessment: Pt is 30w gestation admitted on 02/23/19 for severe preeclampsia.  Pt has been on lovenox 0.5 mg/kg SQ Q24hr for VTE prophylaxis 5/13>>5/30.  Lovenox is being restarted today after labs evaluated and no plans for C-section today.  Plan:  Lovenox 0.5 mg/kg (80 mg) SQ Q24hr  Beryle Lathe 03/20/2019,8:37 AM

## 2019-03-21 DIAGNOSIS — O99213 Obesity complicating pregnancy, third trimester: Secondary | ICD-10-CM

## 2019-03-21 DIAGNOSIS — N189 Chronic kidney disease, unspecified: Secondary | ICD-10-CM

## 2019-03-21 DIAGNOSIS — O24313 Unspecified pre-existing diabetes mellitus in pregnancy, third trimester: Secondary | ICD-10-CM

## 2019-03-21 LAB — GLUCOSE, CAPILLARY
Glucose-Capillary: 108 mg/dL — ABNORMAL HIGH (ref 70–99)
Glucose-Capillary: 106 mg/dL — ABNORMAL HIGH (ref 70–99)
Glucose-Capillary: 136 mg/dL — ABNORMAL HIGH (ref 70–99)
Glucose-Capillary: 146 mg/dL — ABNORMAL HIGH (ref 70–99)

## 2019-03-21 NOTE — Progress Notes (Signed)
Daily Antepartum Note  Admission Date: 02/23/2019 Current Date: 03/21/2019 9:49 AM  Danielle Kent is a 33 y.o. G2P1001 @ [redacted]w[redacted]d, HD#27, admitted for severe pre-eclampsia (BPs) superimposed on chronic HTN.  Pregnancy complicated by: Patient Active Problem List   Diagnosis Date Noted  . Malpresentation before onset of labor 03/16/2019  . BMI 70 and over, adult ([place]) 03/15/2019  . Chronic renal insufficiency 03/15/2019  . Severe hypertension   . Chronic hypertension with superimposed preeclampsia 02/23/2019  . Preexisting diabetes complicating pregnancy, antepartum 12/27/2018  . Previous cesarean section 12/06/2018  . Chronic hypertension during pregnancy, antepartum 12/06/2018  . Maternal morbid obesity, antepartum ([place]) 12/06/2018  . Supervision of high risk pregnancy, antepartum 11/25/2018  . Hypertensive urgency 11/25/2018    Subjective:  Patient reports feeling well. She denies s/s of pre-eclampsia, preterm labor or decreased FM  Objective:    Current Vital Signs 24h Vital Sign Ranges  T 97.9 F (36.6 C) Temp  Avg: 98.2 F (36.8 C)  Min: 97.8 F (36.6 C)  Max: 98.5 F (36.9 C)  BP (!) 149/88(notified nurse) BP  Min: 119/51  Max: 181/98  HR 80 Pulse  Avg: 83.5  Min: 78  Max: 92  RR 18 Resp  Avg: 17.8  Min: 17  Max: 18  SaO2 98 % Room Air SpO2  Avg: 98.2 %  Min: 97 %  Max: 99 %       24 Hour I/O Current Shift I/O  Time Ins Outs No intake/output data recorded. No intake/output data recorded.   Patient Vitals for the past 24 hrs:  BP Temp Temp src Pulse Resp SpO2  03/21/19 0737 (!) 149/88 97.9 F (36.6 C) Oral 80 18 98 %  03/21/19 0735 - - - - - 98 %  03/21/19 0445 - - - - - 97 %  03/21/19 0440 - - - - - 97 %  03/21/19 0435 - - - - - 98 %  03/21/19 0430 - - - - - 98 %  03/21/19 0425 - - - - - 97 %  03/21/19 0420 - - - - - 98 %  03/21/19 0415 - - - - - 98 %  03/21/19 0410 - - - - - 98 %  03/21/19 0405 - - - - - 98 %  03/21/19 0400 - - - - - 98 %  03/21/19 0355 - - - -  - 98 %  03/21/19 0350 - - - - - 97 %  03/21/19 0205 - - - - - 99 %  03/21/19 0200 - - - - - 99 %  03/21/19 0155 - - - - - 99 %  03/21/19 0150 - - - - - 99 %  03/21/19 0145 - - - - - 99 %  03/21/19 0140 - - - - - 98 %  03/21/19 0135 - - - - - 99 %  03/21/19 0130 - - - - - 99 %  03/21/19 0121 (!) 155/81 - - 85 - -  03/21/19 0104 (!) 171/79 - - 86 - -  03/21/19 0101 (!) 173/88 - - 84 - -  03/21/19 0046 (!) 167/95 - - 81 - -  03/21/19 0028 (!) 181/98 - - 78 - -  03/20/19 2351 (!) 160/94 - - 80 - -  03/20/19 2336 (!) 180/115 - - 86 - -  03/20/19 2203 (!) 153/92 - - 80 - -  03/20/19 2151 (!) 165/81 - - 88 - -  03/20/19 1942 (!) 141/87 98.5  F (36.9 C) Oral 92 18 -  03/20/19 1815 131/72 - - 82 - -  03/20/19 1704 (!) 119/51 98.4 F (36.9 C) Oral - 18 99 %  03/20/19 1357 (!) 155/84 97.8 F (36.6 C) Oral - 17 -   FHR: 135 baseline, no accel, no decel, mod variablity Toco: no contractions   Physical exam: General: Well nourished, well developed female in no acute distress. Abdomen: obese, nttp, nd Cardiovascular: S1, S2 normal, no murmur, rub or gallop, regular rate and rhythm Respiratory: CTAB Extremities: no clubbing, cyanosis or edema Skin: Warm and dry.   Medications: Current Facility-Administered Medications  Medication Dose Route Frequency Provider Last Rate Last Dose  . acetaminophen (TYLENOL) tablet 650 mg  650 mg Oral Q4H PRN Tresea Mall, CNM      . calcium carbonate (TUMS - dosed in mg elemental calcium) chewable tablet 400 mg of elemental calcium  2 tablet Oral Q4H PRN Tresea Mall, CNM      . cloNIDine (CATAPRES) tablet 0.3 mg  0.3 mg Oral TID Sloan Leiter, MD   0.3 mg at 03/21/19 0601  . docusate sodium (COLACE) capsule 100 mg  100 mg Oral Daily Marcille Buffy D, CNM   Stopped at 03/21/19 1000  . enoxaparin (LOVENOX) injection 80 mg  0.5 mg/kg Subcutaneous Q24H Aletha Halim, MD   80 mg at 03/20/19 1046  . hydrALAZINE (APRESOLINE) injection 5 mg  5 mg  Intravenous PRN Marcille Buffy D, CNM   5 mg at 03/21/19 0036   And  . hydrALAZINE (APRESOLINE) injection 10 mg  10 mg Intravenous PRN Marcille Buffy D, CNM   10 mg at 03/21/19 0051   And  . labetalol (NORMODYNE) injection 20 mg  20 mg Intravenous PRN Marcille Buffy D, CNM   20 mg at 03/21/19 0108   And  . labetalol (NORMODYNE) injection 40 mg  40 mg Intravenous PRN Marcille Buffy D, CNM   40 mg at 03/02/19 2349  . hydrALAZINE (APRESOLINE) tablet 100 mg  100 mg Oral Q6H Barrett, Rhonda G, PA-C   100 mg at 03/21/19 0735  . hydrochlorothiazide (HYDRODIURIL) tablet 50 mg  50 mg Oral Daily Sloan Leiter, MD   50 mg at 03/20/19 1045  . labetalol (NORMODYNE) tablet 800 mg  800 mg Oral Q8H Sloan Leiter, MD   800 mg at 03/21/19 0736  . NIFEdipine (PROCARDIA-XL/NIFEDICAL-XL) 24 hr tablet 90 mg  90 mg Oral BID [name], MD   90 mg at 03/20/19 2151  . prenatal multivitamin tablet 1 tablet  1 tablet Oral Q1200 Marcille Buffy D, CNM   1 tablet at 03/20/19 1141  . sodium chloride flush (NS) 0.9 % injection 3 mL  3 mL Intravenous Q12H Chancy Milroy, MD   3 mL at 03/20/19 2148  . zolpidem (AMBIEN) tablet 5 mg  5 mg Oral QHS PRN Tresea Mall, CNM        Labs:  Recent Labs  Lab 03/16/19 0645 03/19/19 0723 03/20/19 0515  WBC 8.0 12.3* 11.8*  HGB 12.2 11.7* 12.1  HCT 36.6 35.4* 37.3  PLT 222 280 278    Recent Labs  Lab 03/16/19 0645 03/19/19 0723 03/20/19 0515  NA 137 134* 134*  K 4.1 4.1 4.0  CL 101 98 101  CO2 24 23 22   BUN 24* 29* 25*  CREATININE 1.28* 1.33* 1.21*  CALCIUM 9.7 9.7 9.4  PROT 6.5 6.7 6.6  BILITOT 0.2* 0.2* 0.3  ALKPHOS 70  75 71  ALT 39 63* 57*  AST 21 29 26   GLUCOSE 102* 120* 101*   CBG (last 3)  Recent Labs    03/20/19 1713 03/20/19 2147 03/21/19 0600  GLUCAP 156* 124* 108*    Radiology:  8/88: cephalic, BPP 8/8 2/80: breech, efw 39%, 1305gm, ac 31%, afi 12.5, bpp 8/8 5/26: breech, AFI 12.5, bpp 8/8 5/7: efw 36%, 852gm, AC 43%, nl AFI and S/D  dopplers  Assessment & Plan:  Pt stable *Pregnancy: NST at baseline. Follow up repeat BPP and growth ultrasound this week   -btl papers signed 5/27   -neg: 2h GTT *cHTN with superimposed severe pre-x: continue BP regimen. Repeat labs on 5/31 stable -s/p Cards consult (signed off). Add isordil 20 tid PRN per last note.  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  Improved Cr on 5/31 -Neprohology signed off. Nothing to do currently. *DM2: Stable on no meds. Appreaciate  Diabetic coordinator recommendation to add novolog sliding scale post meal if CBGs exceed 140 *Preterm: s/p rescue steroids 5/28 and 5/29. Watch CBGs. -BMZ on 5/6 and 5/7. S/p NICU consult. S/p Mg on admission  *h/o c-section: Discussed benefits of TOLAC vs RCS. Patient desires RCS with BTL *PPx: lovenox, oob ad lib *FEN/GI: DM2 diet.  *Dispo: inpatient until delivery. Hope to get to 34wks.

## 2019-03-21 NOTE — Progress Notes (Signed)
Inpatient Diabetes Program Recommendations  AACE/ADA: New Consensus Statement on Inpatient Glycemic Control (2015)  Target Ranges:  Prepandial:   less than 140 mg/dL      [place]ak postprandial:   less than 180 mg/dL (1-2 hours)      Critically ill patients:  140 - 180 mg/dL   Lab Results  Component Value Date   GLUCAP 108 (H) 03/21/2019   HGBA1C 6.2 (H) 03/16/2019    Review of Glycemic Control Results for Danielle Kent, Danielle Kent (MRN [id number]) as of 03/21/2019 08:54  Ref. Range 03/20/2019 11:40 03/20/2019 17:13 03/20/2019 21:47 03/21/2019 06:00  Glucose-Capillary Latest Ref Range: 70 - 99 mg/dL 90 156 (H) 124 (H) 108 (H)   Diabetes history: Type 2 DM Outpatient Diabetes medications: none Current orders for Inpatient glycemic control: none  Inpatient Diabetes Program Recommendations:    If post prandials continue to exceed 140 mg/dL for a 2Hr, consider adding Novolog 0-14 units TID (after meals) under Diabetic Pregnant Patient order set.   Thanks, Bronson Curb, MSN, RNC-OB Diabetes Coordinator [phone] (8a-5p)

## 2019-03-22 LAB — COMPREHENSIVE METABOLIC PANEL
ALT: 43 U/L (ref 0–44)
AST: 22 U/L (ref 15–41)
Albumin: 2.7 g/dL — ABNORMAL LOW (ref 3.5–5.0)
Alkaline Phosphatase: 71 U/L (ref 38–126)
Anion gap: 10 (ref 5–15)
BUN: 29 mg/dL — ABNORMAL HIGH (ref 6–20)
CO2: 24 mmol/L (ref 22–32)
Calcium: 9.5 mg/dL (ref 8.9–10.3)
Chloride: 101 mmol/L (ref 98–111)
Creatinine, Ser: 1.31 mg/dL — ABNORMAL HIGH (ref 0.44–1.00)
GFR calc Af Amer: >60 mL/min (ref >60)
GFR calc non Af Amer: 53 mL/min — ABNORMAL LOW (ref >60)
Glucose, Bld: 109 mg/dL — ABNORMAL HIGH (ref 70–99)
Potassium: 3.8 mmol/L (ref 3.5–5.1)
Sodium: 135 mmol/L (ref 135–145)
Total Bilirubin: 0.4 mg/dL (ref 0.3–1.2)
Total Protein: 6.6 g/dL (ref 6.5–8.1)

## 2019-03-22 LAB — TYPE AND SCREEN
ABO/RH(D): O POS
Antibody Screen: NEGATIVE

## 2019-03-22 LAB — CBC
HCT: 35.4 % — ABNORMAL LOW (ref 36.0–46.0)
Hemoglobin: 11.7 g/dL — ABNORMAL LOW (ref 12.0–15.0)
MCH: 28.1 pg (ref 26.0–34.0)
MCHC: 33.1 g/dL (ref 30.0–36.0)
MCV: 84.9 fL (ref 80.0–100.0)
Platelets: 276 10*3/uL (ref 150–400)
RBC: 4.17 MIL/uL (ref 3.87–5.11)
RDW: 14 % (ref 11.5–15.5)
WBC: 10.1 10*3/uL (ref 4.0–10.5)
nRBC: 0 % (ref 0.0–0.2)

## 2019-03-22 LAB — GLUCOSE, CAPILLARY
Glucose-Capillary: 100 mg/dL — ABNORMAL HIGH (ref 70–99)
Glucose-Capillary: 115 mg/dL — ABNORMAL HIGH (ref 70–99)
Glucose-Capillary: 98 mg/dL (ref 70–99)
Glucose-Capillary: 146 mg/dL — ABNORMAL HIGH (ref 70–99)

## 2019-03-22 NOTE — Progress Notes (Addendum)
Daily Antepartum Note  Admission Date: 02/23/2019 Current Date: 03/22/2019 9:05 AM  Danielle Kent is a 33 y.o. G2P1001 @ [redacted]w[redacted]d, HD#28, admitted for severe pre-eclampsia (BPs) superimposed on chronic HTN.  Pregnancy complicated by: Patient Active Problem List   Diagnosis Date Noted  . Malpresentation before onset of labor 03/16/2019  . BMI 70 and over, adult ([place]) 03/15/2019  . Chronic renal insufficiency 03/15/2019  . Severe hypertension   . Chronic hypertension with superimposed preeclampsia 02/23/2019  . Preexisting diabetes complicating pregnancy, antepartum 12/27/2018  . Previous cesarean section 12/06/2018  . Chronic hypertension during pregnancy, antepartum 12/06/2018  . Maternal morbid obesity, antepartum ([place]) 12/06/2018  . Supervision of high risk pregnancy, antepartum 11/25/2018  . Hypertensive urgency 11/25/2018    Subjective:  Patient reports feeling well. She denies s/s of pre-eclampsia, preterm labor or decreased FM  Objective:    Current Vital Signs 24h Vital Sign Ranges  T 98.2 F (36.8 C) Temp  Avg: 98.1 F (36.7 C)  Min: 97.9 F (36.6 C)  Max: 98.3 F (36.8 C)  BP (!) 146/81 BP  Min: 124/61  Max: 158/86  HR 76 Pulse  Avg: 80.5  Min: 76  Max: 87  RR 20 Resp  Avg: 18.4  Min: 18  Max: 20  SaO2 100 % Room Air SpO2  Avg: 97.4 %  Min: 94 %  Max: 100 %       24 Hour I/O Current Shift I/O  Time Ins Outs No intake/output data recorded. No intake/output data recorded.   Patient Vitals for the past 24 hrs:  BP Temp Temp src Pulse Resp SpO2  03/22/19 0807 (!) 146/81 98.2 F (36.8 C) Oral 76 20 100 %  03/22/19 0540 - - - - - 99 %  03/22/19 0535 - - - - - 99 %  03/22/19 0530 - - - - - 99 %  03/22/19 0525 - - - - - 98 %  03/22/19 0520 - - - - - 98 %  03/22/19 0515 - - - - - 95 %  03/22/19 0514 (!) 158/86 97.9 F (36.6 C) Other 79 - 99 %  03/22/19 0510 - - - - - 98 %  03/22/19 0505 - - - - - 97 %  03/22/19 0500 - - - - - 97 %  03/22/19 0455 - - - - - 98 %   03/22/19 0450 - - - - - 94 %  03/22/19 0445 - - - - - 97 %  03/22/19 0440 - - - - - 98 %  03/22/19 0435 - - - - - 97 %  03/22/19 0430 - - - - - 97 %  03/22/19 0425 - - - - - 98 %  03/22/19 0420 - - - - - 98 %  03/22/19 0415 - - - - - 98 %  03/22/19 0410 - - - - - 98 %  03/22/19 0405 - - - - - 97 %  03/22/19 0400 - - - - - 97 %  03/22/19 0355 - - - - - 98 %  03/22/19 0350 - - - - - 98 %  03/22/19 0345 - - - - - 97 %  03/22/19 0340 - - - - - 98 %  03/22/19 0335 - - - - - 97 %  03/22/19 0330 - - - - - 98 %  03/22/19 0325 - - - - - 98 %  03/22/19 0320 - - - - - 98 %  03/22/19 0315 - - - - -  98 %  03/22/19 0310 - - - - - 97 %  03/22/19 0305 - - - - - 97 %  03/22/19 0300 - - - - - 98 %  03/22/19 0255 - - - - - 98 %  03/22/19 0250 - - - - - 97 %  03/22/19 0245 - - - - - 98 %  03/22/19 0240 - - - - - 97 %  03/22/19 0235 - - - - - 98 %  03/22/19 0230 - - - - - 98 %  03/22/19 0225 - - - - - 98 %  03/22/19 0220 - - - - - 96 %  03/22/19 0215 - - - - - 96 %  03/22/19 0210 - - - - - 97 %  03/22/19 0205 - - - - - 97 %  03/22/19 0200 - - - - - 97 %  03/22/19 0155 - - - - - 97 %  03/22/19 0150 - - - - - 97 %  03/22/19 0145 - - - - - 97 %  03/22/19 0140 - - - - - 97 %  03/22/19 0135 - - - - - 97 %  03/22/19 0130 - - - - - 97 %  03/22/19 0125 - - - - - 97 %  03/22/19 0120 - - - - - 97 %  03/22/19 0115 - - - - - 96 %  03/22/19 0110 - - - - - 97 %  03/22/19 0105 - - - - - 97 %  03/22/19 0100 - - - - - 95 %  03/22/19 0055 - - - - - 95 %  03/22/19 0050 - - - - - 97 %  03/22/19 0045 - - - - - 96 %  03/22/19 0040 - - - - - 97 %  03/22/19 0035 - - - - - 97 %  03/22/19 0030 - - - - - 97 %  03/22/19 0025 - - - - - 98 %  03/22/19 0020 - - - - - 98 %  03/22/19 0015 - - - - - 98 %  03/22/19 0010 - - - - - 98 %  03/22/19 0005 - - - - - 98 %  03/22/19 0000 - - - - - 98 %  03/21/19 2355 - - - - - 98 %  03/21/19 2350 - - - - - 98 %  03/21/19 2345 - - - - - 98 %  03/21/19 2320 131/71 98.1  F (36.7 C) Oral 84 18 98 %  03/21/19 2012 124/61 98.3 F (36.8 C) - 87 18 99 %  03/21/19 1651 126/72 98 F (36.7 C) Oral 81 18 97 %  03/21/19 1128 (!) 148/63 98.1 F (36.7 C) Oral 76 18 98 %   FHR: 135 baseline, no accel, no decel, mod variablity Toco: no contractions   Physical exam: General: Well nourished, well developed female in no acute distress. Abdomen: obese, nttp, nd Cardiovascular: S1, S2 normal, no murmur, rub or gallop, regular rate and rhythm Respiratory: CTAB Extremities: no clubbing, cyanosis or edema Skin: Warm and dry.   Medications: Current Facility-Administered Medications  Medication Dose Route Frequency Provider Last Rate Last Dose  . acetaminophen (TYLENOL) tablet 650 mg  650 mg Oral Q4H PRN Tresea Mall, CNM      . calcium carbonate (TUMS - dosed in mg elemental calcium) chewable tablet 400 mg of elemental calcium  2 tablet Oral Q4H PRN Tresea Mall, CNM      .  cloNIDine (CATAPRES) tablet 0.3 mg  0.3 mg Oral TID Sloan Leiter, MD   0.3 mg at 03/22/19 5465  . docusate sodium (COLACE) capsule 100 mg  100 mg Oral Daily Marcille Buffy D, CNM   Stopped at 03/21/19 1000  . enoxaparin (LOVENOX) injection 80 mg  0.5 mg/kg Subcutaneous Q24H Aletha Halim, MD   80 mg at 03/21/19 0959  . hydrALAZINE (APRESOLINE) injection 5 mg  5 mg Intravenous PRN Marcille Buffy D, CNM   5 mg at 03/21/19 0036   And  . hydrALAZINE (APRESOLINE) injection 10 mg  10 mg Intravenous PRN Marcille Buffy D, CNM   10 mg at 03/21/19 0051   And  . labetalol (NORMODYNE) injection 20 mg  20 mg Intravenous PRN Marcille Buffy D, CNM   20 mg at 03/21/19 0108   And  . labetalol (NORMODYNE) injection 40 mg  40 mg Intravenous PRN Marcille Buffy D, CNM   40 mg at 03/02/19 2349  . hydrALAZINE (APRESOLINE) tablet 100 mg  100 mg Oral Q6H Barrett, Rhonda G, PA-C   100 mg at 03/22/19 0809  . hydrochlorothiazide (HYDRODIURIL) tablet 50 mg  50 mg Oral Daily Sloan Leiter, MD   50 mg at 03/21/19  6812  . labetalol (NORMODYNE) tablet 800 mg  800 mg Oral Q8H Sloan Leiter, MD   800 mg at 03/22/19 0809  . NIFEdipine (PROCARDIA-XL/NIFEDICAL-XL) 24 hr tablet 90 mg  90 mg Oral BID [name], MD   90 mg at 03/21/19 2205  . prenatal multivitamin tablet 1 tablet  1 tablet Oral Q1200 Marcille Buffy D, CNM   1 tablet at 03/21/19 7517  . sodium chloride flush (NS) 0.9 % injection 3 mL  3 mL Intravenous Q12H Chancy Milroy, MD   3 mL at 03/21/19 2210  . zolpidem (AMBIEN) tablet 5 mg  5 mg Oral QHS PRN Tresea Mall, CNM        Labs:  Recent Labs  Lab 03/19/19 [phone] 03/20/19 0515 03/22/19 0533  WBC 12.3* 11.8* 10.1  HGB 11.7* 12.1 11.7*  HCT 35.4* 37.3 35.4*  PLT 280 278 276    Recent Labs  Lab 03/19/19 0723 03/20/19 0515 03/22/19 0533  NA 134* 134* 135  K 4.1 4.0 3.8  CL 98 101 101  CO2 23 22 24   BUN 29* 25* 29*  CREATININE 1.33* 1.21* 1.31*  CALCIUM 9.7 9.4 9.5  PROT 6.7 6.6 6.6  BILITOT 0.2* 0.3 0.4  ALKPHOS 75 71 71  ALT 63* 57* 43  AST 29 26 22   GLUCOSE 120* 101* 109*   CBG (last 3)  Recent Labs    03/21/19 1649 03/21/19 2150 03/22/19 0641  GLUCAP 136* 146* 100*    Radiology:  4/94: cephalic, BPP 8/8 4/96: breech, efw 39%, 1305gm, ac 31%, afi 12.5, bpp 8/8 5/26: breech, AFI 12.5, bpp 8/8 5/7: efw 36%, 852gm, AC 43%, nl AFI and S/D dopplers  Assessment & Plan:  Pt stable *Pregnancy: NST at baseline. Follow up repeat BPP and growth ultrasound this week   -btl papers signed 5/27 *cHTN with superimposed severe pre-x: continue BP regimen. Repeat labs today stable -s/p Cards consult (signed off). Add isordil 20 tid PRN per last note.  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  Improved Cr on 5/31 -Neprohology signed off. Nothing to do currently. *DM2: Stable on no meds. Appreaciate  Diabetic coordinator recommendation to add novolog sliding scale post meal if CBGs exceed 140.  Will continue diet contol - s/p rescue  steroids 5/28 and 5/29. Watch CBGs. -BMZ on 5/6 and 5/7. S/p NICU consult. S/p Mg on admission  *h/o c-section: Discussed benefits of TOLAC vs RCS. Patient desires RCS with BTL *PPx: lovenox, oob ad lib *FEN/GI: DM2 diet.  *Dispo: inpatient until delivery. Hope to get to 34wks.

## 2019-03-23 LAB — GLUCOSE, CAPILLARY
Glucose-Capillary: 113 mg/dL — ABNORMAL HIGH (ref 70–99)
Glucose-Capillary: 121 mg/dL — ABNORMAL HIGH (ref 70–99)
Glucose-Capillary: 137 mg/dL — ABNORMAL HIGH (ref 70–99)
Glucose-Capillary: 126 mg/dL — ABNORMAL HIGH (ref 70–99)

## 2019-03-23 MED ORDER — PHENYLEPHRINE HCL-NACL 10-0.9 MG/250ML-% IV SOLN
INTRAVENOUS | Status: AC
  Filled 2019-03-23: qty 500

## 2019-03-23 NOTE — Progress Notes (Signed)
Refused monitoring at this time.

## 2019-03-23 NOTE — Progress Notes (Signed)
Inpatient Diabetes Program Recommendations ADA Standards of Care 2019 Diabetes in Pregnancy Target Glucose Ranges:  Fasting: 60 - 90 mg/dL Preprandial: 60 - 105 mg/dL 1 hr postprandial: Less than 140mg /dL (from first bite of meal) 2 hr postprandial: Less than 120 mg/dL (from first bit of meal)   Lab Results  Component Value Date   GLUCAP 113 (H) 03/23/2019   HGBA1C 6.2 (H) 03/16/2019    Review of Glycemic Control Results for Danielle Kent, Danielle Kent (MRN [id number]) as of 03/23/2019 09:19  Ref. Range 03/22/2019 17:04 03/22/2019 21:18 03/23/2019 06:32  Glucose-Capillary Latest Ref Range: 70 - 99 mg/dL 98 146 (H) 113 (H)   Diabetes history: Type 2 DM Outpatient Diabetes medications: none Current orders for Inpatient glycemic control: none  Inpatient Diabetes Program Recommendations:    If post prandials continue to exceed 120 mg/dL for a 2Hr, consider adding Novolog 0-14 units TID (after meals) under Diabetic Pregnant Patient order set.   Thanks, Bronson Curb, MSN, RNC-OB Diabetes Coordinator [phone] (8a-5p)

## 2019-03-23 NOTE — Progress Notes (Signed)
ANTICOAGULATION CONSULT NOTE -   Pharmacy Consult for lovenox Indication: VTE prophylaxis  No Known Allergies  Patient Measurements: Height: 5' (152.4 cm) Weight: (!) 354 lb 11.5 oz (160.9 kg) IBW/kg (Calculated) : 45.5kg BMI = 69  Vital Signs: Temp: 97.9 F (36.6 C) (06/03 0800) Temp Source: Oral (06/03 0800) BP: 126/87 (06/03 0800) Pulse Rate: 80 (06/03 0800)  Labs: Recent Labs    03/22/19 0533  HGB 11.7*  HCT 35.4*  PLT 276  CREATININE 1.31*    Estimated Creatinine Clearance: 88.4 mL/min (A) (by C-G formula based on SCr of 1.31 mg/dL (H)).   Medical History: Past Medical History:  Diagnosis Date  . Asthma   . Gestational diabetes 2020  . Hypertension     Medications:  Scheduled:  . cloNIDine  0.3 mg Oral TID  . docusate sodium  100 mg Oral Daily  . enoxaparin (LOVENOX) injection  0.5 mg/kg Subcutaneous Q24H  . hydrALAZINE  100 mg Oral Q6H  . hydrochlorothiazide  50 mg Oral Daily  . labetalol  800 mg Oral Q8H  . NIFEdipine  90 mg Oral BID  . prenatal multivitamin  1 tablet Oral Q1200  . sodium chloride flush  3 mL Intravenous Q12H    Assessment: Pt is 30w gestation admitted on 02/23/19 for severe preeclampsia.  Pt has been on lovenox 0.5 mg/kg SQ Q24hr for VTE prophylaxis 5/13>>5/30.  Resumed 5/31 per pharmacy.  Hgb 11.7 (6/2) Plts 276 (6/2)  Plan:  Continue lovenox 0.5 mg/kg (80 mg) SQ Q24hr  Dolly Rias RPh 03/23/2019, 8:46 AM

## 2019-03-23 NOTE — Progress Notes (Signed)
Daily Antepartum Note  Admission Date: 02/23/2019 Current Date: 03/23/2019 9:33 AM  Danielle Kent is a 33 y.o. G2P1001 @ [redacted]w[redacted]d, HD#29, admitted for severe pre-eclampsia (BPs) superimposed on chronic HTN.  Pregnancy complicated by: Patient Active Problem List   Diagnosis Date Noted  . Malpresentation before onset of labor 03/16/2019  . BMI 70 and over, adult ([place]) 03/15/2019  . Chronic renal insufficiency 03/15/2019  . Severe hypertension   . Chronic hypertension with superimposed preeclampsia 02/23/2019  . Preexisting diabetes complicating pregnancy, antepartum 12/27/2018  . Previous cesarean section 12/06/2018  . Chronic hypertension during pregnancy, antepartum 12/06/2018  . Maternal morbid obesity, antepartum ([place]) 12/06/2018  . Supervision of high risk pregnancy, antepartum 11/25/2018  . Hypertensive urgency 11/25/2018    Subjective:  Patient reports feeling well. She denies s/s of pre-eclampsia, preterm labor or decreased FM  Objective:    Current Vital Signs 24h Vital Sign Ranges  T 97.9 F (36.6 C) Temp  Avg: 98.2 F (36.8 C)  Min: 97.9 F (36.6 C)  Max: 98.4 F (36.9 C)  BP 126/87 BP  Min: 126/87  Max: 154/81  HR 80 Pulse  Avg: 83  Min: 78  Max: 89  RR 18 Resp  Avg: 18.5  Min: 18  Max: 20  SaO2 99 % Room Air SpO2  Avg: 99.6 %  Min: 99 %  Max: 100 %       24 Hour I/O Current Shift I/O  Time Ins Outs No intake/output data recorded. No intake/output data recorded.   Patient Vitals for the past 24 hrs:  BP Temp Temp src Pulse Resp SpO2  03/23/19 0800 126/87 97.9 F (36.6 C) Oral 80 18 99 %  03/23/19 0357 (!) 147/88 98.3 F (36.8 C) Oral 78 18 100 %  03/22/19 2319 (!) 154/81 98.2 F (36.8 C) Oral 87 18 100 %  03/22/19 1956 (!) 152/84 98.2 F (36.8 C) Oral 85 19 99 %  03/22/19 1546 (!) 146/85 98.4 F (36.9 C) Oral 89 20 100 %  03/22/19 1139 138/78 - - 79 - -  03/22/19 1138 - 98.3 F (36.8 C) Oral - 18 -   FHR: 135 baseline, 10x10 accel, no decel, mod  variablity Toco: no contractions   Physical exam: General: Well nourished, well developed female in no acute distress. Abdomen: obese, nttp, nd Cardiovascular: S1, S2 normal, no murmur, rub or gallop, regular rate and rhythm Respiratory: CTAB Extremities: no clubbing, cyanosis or edema Skin: Warm and dry.   Medications: Current Facility-Administered Medications  Medication Dose Route Frequency Provider Last Rate Last Dose  . acetaminophen (TYLENOL) tablet 650 mg  650 mg Oral Q4H PRN Tresea Mall, CNM      . calcium carbonate (TUMS - dosed in mg elemental calcium) chewable tablet 400 mg of elemental calcium  2 tablet Oral Q4H PRN Tresea Mall, CNM      . cloNIDine (CATAPRES) tablet 0.3 mg  0.3 mg Oral TID Sloan Leiter, MD   0.3 mg at 03/23/19 1610  . docusate sodium (COLACE) capsule 100 mg  100 mg Oral Daily Marcille Buffy D, CNM   Stopped at 03/21/19 1000  . enoxaparin (LOVENOX) injection 80 mg  0.5 mg/kg Subcutaneous Q24H Aletha Halim, MD   80 mg at 03/22/19 0957  . hydrALAZINE (APRESOLINE) injection 5 mg  5 mg Intravenous PRN Marcille Buffy D, CNM   5 mg at 03/21/19 0036   And  . hydrALAZINE (APRESOLINE) injection 10 mg  10 mg Intravenous PRN  Marcille Buffy D, CNM   10 mg at 03/21/19 0051   And  . labetalol (NORMODYNE) injection 20 mg  20 mg Intravenous PRN Marcille Buffy D, CNM   20 mg at 03/21/19 0108   And  . labetalol (NORMODYNE) injection 40 mg  40 mg Intravenous PRN Marcille Buffy D, CNM   40 mg at 03/02/19 2349  . hydrALAZINE (APRESOLINE) tablet 100 mg  100 mg Oral Q6H Barrett, Rhonda G, PA-C   100 mg at 03/23/19 0800  . hydrochlorothiazide (HYDRODIURIL) tablet 50 mg  50 mg Oral Daily Sloan Leiter, MD   50 mg at 03/22/19 1610  . labetalol (NORMODYNE) tablet 800 mg  800 mg Oral Q8H Sloan Leiter, MD   800 mg at 03/23/19 0800  . NIFEdipine (PROCARDIA-XL/NIFEDICAL-XL) 24 hr tablet 90 mg  90 mg Oral BID [name], MD   90 mg at 03/22/19 2226  . prenatal  multivitamin tablet 1 tablet  1 tablet Oral Q1200 Marcille Buffy D, CNM   1 tablet at 03/22/19 9604  . sodium chloride flush (NS) 0.9 % injection 3 mL  3 mL Intravenous Q12H Chancy Milroy, MD   3 mL at 03/22/19 2227  . zolpidem (AMBIEN) tablet 5 mg  5 mg Oral QHS PRN Tresea Mall, CNM        Labs:  Recent Labs  Lab 03/19/19 [phone] 03/20/19 0515 03/22/19 0533  WBC 12.3* 11.8* 10.1  HGB 11.7* 12.1 11.7*  HCT 35.4* 37.3 35.4*  PLT 280 278 276    Recent Labs  Lab 03/19/19 0723 03/20/19 0515 03/22/19 0533  NA 134* 134* 135  K 4.1 4.0 3.8  CL 98 101 101  CO2 23 22 24   BUN 29* 25* 29*  CREATININE 1.33* 1.21* 1.31*  CALCIUM 9.7 9.4 9.5  PROT 6.7 6.6 6.6  BILITOT 0.2* 0.3 0.4  ALKPHOS 75 71 71  ALT 63* 57* 43  AST 29 26 22   GLUCOSE 120* 101* 109*   CBG (last 3)  Recent Labs    03/22/19 1704 03/22/19 2118 03/23/19 0632  GLUCAP 98 146* 113*    Radiology:  8/11: cephalic, BPP 8/8 9/14: breech, efw 39%, 1305gm, ac 31%, afi 12.5, bpp 8/8 5/26: breech, AFI 12.5, bpp 8/8 5/7: efw 36%, 852gm, AC 43%, nl AFI and S/D dopplers  Assessment & Plan:  Pt stable *Pregnancy: NST at baseline. Follow up repeat BPP and growth ultrasound on 6/5   -btl papers signed 5/27 *cHTN with superimposed severe pre-x: continue current BP regimen.  -s/p Cards consult (signed off). Add isordil 20 tid PRN per last note.  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  Improved Cr on 5/31 -Neprohology signed off. Nothing to do currently. *DM2: Stable on no meds. Appreaciate  Diabetic coordinator recommendation to add novolog sliding scale post meal if CBGs exceed 140. Will continue diet contol - s/p rescue steroids 5/28 and 5/29. Watch CBGs. -BMZ on 5/6 and 5/7. S/p NICU consult. S/p Mg on admission  *h/o c-section: Discussed benefits of TOLAC vs RCS. Patient desires RCS with BTL *PPx: lovenox, oob ad lib *FEN/GI: DM2 diet.  *Dispo: inpatient until delivery. Hope  to get to 34wks.

## 2019-03-24 LAB — GLUCOSE, CAPILLARY
Glucose-Capillary: 104 mg/dL — ABNORMAL HIGH (ref 70–99)
Glucose-Capillary: 104 mg/dL — ABNORMAL HIGH (ref 70–99)
Glucose-Capillary: 101 mg/dL — ABNORMAL HIGH (ref 70–99)
Glucose-Capillary: 178 mg/dL — ABNORMAL HIGH (ref 70–99)

## 2019-03-24 MED ORDER — LACTATED RINGERS IV BOLUS
500.0000 mL | Freq: Once | INTRAVENOUS | Status: AC
  Administered 2019-03-24: 500 mL via INTRAVENOUS

## 2019-03-24 MED ORDER — LACTATED RINGERS BOLUS PEDS: 500.0000 mL | Freq: Once | INTRAVENOUS | Status: DC

## 2019-03-24 NOTE — Progress Notes (Signed)
Daily Antepartum Note  Admission Date: 02/23/2019 Current Date: 03/24/2019 8:58 AM  Danielle Kent is a 33 y.o. G2P1001 @ [redacted]w[redacted]d, HD#30, admitted for severe pre-eclampsia (BPs) superimposed on chronic HTN.  Pregnancy complicated by: Patient Active Problem List   Diagnosis Date Noted  . Malpresentation before onset of labor 03/16/2019  . BMI 70 and over, adult ([place]) 03/15/2019  . Chronic renal insufficiency 03/15/2019  . Severe hypertension   . Chronic hypertension with superimposed preeclampsia 02/23/2019  . Preexisting diabetes complicating pregnancy, antepartum 12/27/2018  . Previous cesarean section 12/06/2018  . Chronic hypertension during pregnancy, antepartum 12/06/2018  . Maternal morbid obesity, antepartum ([place]) 12/06/2018  . Supervision of high risk pregnancy, antepartum 11/25/2018  . Hypertensive urgency 11/25/2018    Subjective:  Patient reports feeling well. She denies s/s of pre-eclampsia, preterm labor or decreased FM  Objective:    Current Vital Signs 24h Vital Sign Ranges  T 98.3 F (36.8 C) Temp  Avg: 98.2 F (36.8 C)  Min: 97.7 F (36.5 C)  Max: 98.4 F (36.9 C)  BP (!) 152/89 BP  Min: 137/81  Max: 152/89  HR 78 Pulse  Avg: 83.7  Min: 78  Max: 89  RR 18 Resp  Avg: 17.6  Min: 17  Max: 18  SaO2 99 % Room Air SpO2  Avg: 99 %  Min: 97 %  Max: 100 %       24 Hour I/O Current Shift I/O  Time Ins Outs No intake/output data recorded. No intake/output data recorded.   Patient Vitals for the past 24 hrs:  BP Temp Temp src Pulse Resp SpO2  03/24/19 0800 (!) 152/89 98.3 F (36.8 C) Oral 78 18 99 %  03/24/19 0540 - - - - - 99 %  03/24/19 0539 139/83 98.2 F (36.8 C) - 82 18 -  03/24/19 0005 - - - - - 97 %  03/24/19 0004 (!) 147/84 98.4 F (36.9 C) Oral 84 18 -  03/23/19 2006 137/81 98.3 F (36.8 C) Oral 89 - -  03/23/19 1610 140/69 98.1 F (36.7 C) Oral 86 17 100 %  03/23/19 1240 (!) 143/82 97.7 F (36.5 C) Oral 83 17 100 %   FHR: 135 baseline, no accel, no  decel, mod variablity Toco: no contractions   Physical exam: General: Well nourished, well developed female in no acute distress. Abdomen: obese, nttp, nd Cardiovascular: S1, S2 normal, no murmur, rub or gallop, regular rate and rhythm Respiratory: CTAB Extremities: no clubbing, cyanosis or edema Skin: Warm and dry.   Medications: Current Facility-Administered Medications  Medication Dose Route Frequency Provider Last Rate Last Dose  . acetaminophen (TYLENOL) tablet 650 mg  650 mg Oral Q4H PRN Tresea Mall, CNM      . calcium carbonate (TUMS - dosed in mg elemental calcium) chewable tablet 400 mg of elemental calcium  2 tablet Oral Q4H PRN Tresea Mall, CNM      . cloNIDine (CATAPRES) tablet 0.3 mg  0.3 mg Oral TID Sloan Leiter, MD   0.3 mg at 03/24/19 1761  . docusate sodium (COLACE) capsule 100 mg  100 mg Oral Daily Marcille Buffy D, CNM   Stopped at 03/21/19 1000  . enoxaparin (LOVENOX) injection 80 mg  0.5 mg/kg Subcutaneous Q24H Aletha Halim, MD   80 mg at 03/23/19 0955  . hydrALAZINE (APRESOLINE) injection 5 mg  5 mg Intravenous PRN Marcille Buffy D, CNM   5 mg at 03/21/19 0036   And  . hydrALAZINE (  APRESOLINE) injection 10 mg  10 mg Intravenous PRN Marcille Buffy D, CNM   10 mg at 03/21/19 0051   And  . labetalol (NORMODYNE) injection 20 mg  20 mg Intravenous PRN Marcille Buffy D, CNM   20 mg at 03/21/19 0108   And  . labetalol (NORMODYNE) injection 40 mg  40 mg Intravenous PRN Marcille Buffy D, CNM   40 mg at 03/02/19 2349  . hydrALAZINE (APRESOLINE) tablet 100 mg  100 mg Oral Q6H Barrett, Rhonda G, PA-C   100 mg at 03/24/19 0756  . hydrochlorothiazide (HYDRODIURIL) tablet 50 mg  50 mg Oral Daily Sloan Leiter, MD   50 mg at 03/23/19 0955  . labetalol (NORMODYNE) tablet 800 mg  800 mg Oral Q8H Sloan Leiter, MD   800 mg at 03/24/19 0756  . NIFEdipine (PROCARDIA-XL/NIFEDICAL-XL) 24 hr tablet 90 mg  90 mg Oral BID [name], MD   90 mg at 03/23/19 2153  .  prenatal multivitamin tablet 1 tablet  1 tablet Oral Q1200 Marcille Buffy D, CNM   1 tablet at 03/23/19 1420  . sodium chloride flush (NS) 0.9 % injection 3 mL  3 mL Intravenous Q12H Chancy Milroy, MD   3 mL at 03/23/19 2154  . zolpidem (AMBIEN) tablet 5 mg  5 mg Oral QHS PRN Tresea Mall, CNM        Labs:  Recent Labs  Lab 03/19/19 [phone] 03/20/19 0515 03/22/19 0533  WBC 12.3* 11.8* 10.1  HGB 11.7* 12.1 11.7*  HCT 35.4* 37.3 35.4*  PLT 280 278 276    Recent Labs  Lab 03/19/19 0723 03/20/19 0515 03/22/19 0533  NA 134* 134* 135  K 4.1 4.0 3.8  CL 98 101 101  CO2 23 22 24   BUN 29* 25* 29*  CREATININE 1.33* 1.21* 1.31*  CALCIUM 9.7 9.4 9.5  PROT 6.7 6.6 6.6  BILITOT 0.2* 0.3 0.4  ALKPHOS 75 71 71  ALT 63* 57* 43  AST 29 26 22   GLUCOSE 120* 101* 109*   CBG (last 3)  Recent Labs    03/23/19 1712 03/23/19 2140 03/24/19 0542  GLUCAP 137* 126* 104*    Radiology:  8/32: cephalic, BPP 8/8 5/49: breech, efw 39%, 1305gm, ac 31%, afi 12.5, bpp 8/8 5/26: breech, AFI 12.5, bpp 8/8 5/7: efw 36%, 852gm, AC 43%, nl AFI and S/D dopplers  Assessment & Plan:  33 yo G2P1 at [redacted]w[redacted]d with CHTN with superimposed severe preeclampsia and type 2 DM - Maternal fetal status is stable - Continue current BP regimen.  - CBGs stable. Will continue diet contol - s/p rescue steroids 5/28 and 5/29.  - Patient with previous c-section and desires RCS with BTL - DVT prophylaxis with lovenox, oob ad lib - Follow up BPP and growth ultrasound tomorrow - Dispo: inpatient until delivery. Hope to get to 34wks.

## 2019-03-24 NOTE — Progress Notes (Signed)
Inpatient Diabetes Program Recommendations ADA Standards of Care 2019 Diabetes in Pregnancy Target Glucose Ranges:  Fasting: 60 - 90 mg/dL Preprandial: 60 - 105 mg/dL 1 hr postprandial: Less than 140mg /dL (from first bite of meal) 2 hr postprandial: Less than 120 mg/dL (from first bit of meal)   Review of Glycemic Control Results for Danielle Kent, Danielle Kent (MRN [id number]) as of 03/24/2019 15:29  Ref. Range 03/23/2019 06:32 03/23/2019 10:52 03/23/2019 17:12 03/23/2019 21:40 03/24/2019 05:42 03/24/2019 12:05  Glucose-Capillary Latest Ref Range: 70 - 99 mg/dL 113 (H) 121 (H) 137 (H) 126 (H) 104 (H) 104 (H)    Diabetes history: Type 2 DM Outpatient Diabetes medications: none Current orders for Inpatient glycemic control: none  Inpatient Diabetes Program Recommendations:    Noted glucose trends went up to 137 postprandially yesterday evening. Also note glucose 104 postprandially today.   If post prandials continue to exceed 120 mg/dL for a 2Hr, consider adding Novolog 0-14 units TID (after meals) under Diabetic Pregnant Patient order set.   Thanks, Tama Headings RN, MSN, BC-ADM Inpatient Diabetes Coordinator Team Pager [phone] (8a-5p)

## 2019-03-25 ENCOUNTER — Inpatient Hospital Stay (HOSPITAL_BASED_OUTPATIENT_CLINIC_OR_DEPARTMENT_OTHER): Payer: Medicaid Other

## 2019-03-25 DIAGNOSIS — Z3A30 30 weeks gestation of pregnancy: Secondary | ICD-10-CM

## 2019-03-25 DIAGNOSIS — O3413 Maternal care for benign tumor of corpus uteri, third trimester: Secondary | ICD-10-CM

## 2019-03-25 DIAGNOSIS — O113 Pre-existing hypertension with pre-eclampsia, third trimester: Secondary | ICD-10-CM | POA: Diagnosis not present

## 2019-03-25 DIAGNOSIS — D259 Leiomyoma of uterus, unspecified: Secondary | ICD-10-CM

## 2019-03-25 DIAGNOSIS — O10913 Unspecified pre-existing hypertension complicating pregnancy, third trimester: Secondary | ICD-10-CM

## 2019-03-25 DIAGNOSIS — J45909 Unspecified asthma, uncomplicated: Secondary | ICD-10-CM

## 2019-03-25 DIAGNOSIS — O99213 Obesity complicating pregnancy, third trimester: Secondary | ICD-10-CM

## 2019-03-25 DIAGNOSIS — O34219 Maternal care for unspecified type scar from previous cesarean delivery: Secondary | ICD-10-CM

## 2019-03-25 DIAGNOSIS — O1413 Severe pre-eclampsia, third trimester: Secondary | ICD-10-CM

## 2019-03-25 DIAGNOSIS — O9989 Other specified diseases and conditions complicating pregnancy, childbirth and the puerperium: Secondary | ICD-10-CM

## 2019-03-25 DIAGNOSIS — O24410 Gestational diabetes mellitus in pregnancy, diet controlled: Secondary | ICD-10-CM

## 2019-03-25 LAB — COMPREHENSIVE METABOLIC PANEL
ALT: 37 U/L (ref 0–44)
AST: 20 U/L (ref 15–41)
Albumin: 2.6 g/dL — ABNORMAL LOW (ref 3.5–5.0)
Alkaline Phosphatase: 73 U/L (ref 38–126)
Anion gap: 11 (ref 5–15)
BUN: 26 mg/dL — ABNORMAL HIGH (ref 6–20)
CO2: 21 mmol/L — ABNORMAL LOW (ref 22–32)
Calcium: 9.5 mg/dL (ref 8.9–10.3)
Chloride: 102 mmol/L (ref 98–111)
Creatinine, Ser: 1.18 mg/dL — ABNORMAL HIGH (ref 0.44–1.00)
GFR calc Af Amer: >60 mL/min (ref >60)
GFR calc non Af Amer: >60 mL/min (ref >60)
Glucose, Bld: 117 mg/dL — ABNORMAL HIGH (ref 70–99)
Potassium: 4.2 mmol/L (ref 3.5–5.1)
Sodium: 134 mmol/L — ABNORMAL LOW (ref 135–145)
Total Bilirubin: 0.5 mg/dL (ref 0.3–1.2)
Total Protein: 6.5 g/dL (ref 6.5–8.1)

## 2019-03-25 LAB — CBC
HCT: 35.9 % — ABNORMAL LOW (ref 36.0–46.0)
Hemoglobin: 12.1 g/dL (ref 12.0–15.0)
MCH: 28.8 pg (ref 26.0–34.0)
MCHC: 33.7 g/dL (ref 30.0–36.0)
MCV: 85.5 fL (ref 80.0–100.0)
Platelets: 269 10*3/uL (ref 150–400)
RBC: 4.2 MIL/uL (ref 3.87–5.11)
RDW: 13.9 % (ref 11.5–15.5)
WBC: 9.2 10*3/uL (ref 4.0–10.5)
nRBC: 0 % (ref 0.0–0.2)

## 2019-03-25 LAB — GLUCOSE, CAPILLARY
Glucose-Capillary: 103 mg/dL — ABNORMAL HIGH (ref 70–99)
Glucose-Capillary: 132 mg/dL — ABNORMAL HIGH (ref 70–99)
Glucose-Capillary: 157 mg/dL — ABNORMAL HIGH (ref 70–99)
Glucose-Capillary: 124 mg/dL — ABNORMAL HIGH (ref 70–99)

## 2019-03-25 NOTE — Progress Notes (Signed)
Daily Antepartum Note  Admission Date: 02/23/2019 Current Date: 03/25/2019 9:25 AM  Danielle Kent is a 33 y.o. G2P1001 @ [redacted]w[redacted]d, HD#31, admitted for severe pre-eclampsia (BPs) superimposed on chronic HTN.  Pregnancy complicated by: Patient Active Problem List   Diagnosis Date Noted  . Malpresentation before onset of labor 03/16/2019  . BMI 70 and over, adult ([place]) 03/15/2019  . Chronic renal insufficiency 03/15/2019  . Severe hypertension   . Chronic hypertension with superimposed preeclampsia 02/23/2019  . Preexisting diabetes complicating pregnancy, antepartum 12/27/2018  . Previous cesarean section 12/06/2018  . Chronic hypertension during pregnancy, antepartum 12/06/2018  . Maternal morbid obesity, antepartum ([place]) 12/06/2018  . Supervision of high risk pregnancy, antepartum 11/25/2018  . Hypertensive urgency 11/25/2018    Subjective:  Patient reports feeling well. She denies s/s of pre-eclampsia, preterm labor or decreased FM  Objective:    Current Vital Signs 24h Vital Sign Ranges  T 98.4 F (36.9 C) Temp  Avg: 98.3 F (36.8 C)  Min: 98.2 F (36.8 C)  Max: 98.4 F (36.9 C)  BP 135/86 BP  Min: 129/77  Max: 148/84  HR 84 Pulse  Avg: 84.3  Min: 78  Max: 91  RR 18 Resp  Avg: 18  Min: 18  Max: 18  SaO2 98 % Room Air SpO2  Avg: 98.7 %  Min: 98 %  Max: 99 %       24 Hour I/O Current Shift I/O  Time Ins Outs No intake/output data recorded. No intake/output data recorded.   Patient Vitals for the past 24 hrs:  BP Temp Temp src Pulse Resp SpO2  03/25/19 0752 135/86 - - 84 - -  03/25/19 0357 (!) 148/84 - - 78 - -  03/24/19 2358 129/77 98.4 F (36.9 C) Oral 91 18 98 %  03/24/19 2040 (!) 147/54 98.3 F (36.8 C) Oral 87 18 99 %  03/24/19 1623 (!) 145/93 98.2 F (36.8 C) Oral 84 18 99 %  03/24/19 1200 (!) 141/78 - - 82 18 -   FHR: 135 baseline, no accel, no decel, mod variablity Toco: no contractions   Physical exam: General: Well nourished, well developed female in no  acute distress. Abdomen: obese, nttp, nd Cardiovascular: S1, S2 normal, no murmur, rub or gallop, regular rate and rhythm Respiratory: CTAB Extremities: no clubbing, cyanosis or edema Skin: Warm and dry.   Medications: Current Facility-Administered Medications  Medication Dose Route Frequency Provider Last Rate Last Dose  . acetaminophen (TYLENOL) tablet 650 mg  650 mg Oral Q4H PRN Tresea Mall, CNM   650 mg at 03/24/19 1641  . calcium carbonate (TUMS - dosed in mg elemental calcium) chewable tablet 400 mg of elemental calcium  2 tablet Oral Q4H PRN Tresea Mall, CNM      . cloNIDine (CATAPRES) tablet 0.3 mg  0.3 mg Oral TID Sloan Leiter, MD   0.3 mg at 03/25/19 0962  . docusate sodium (COLACE) capsule 100 mg  100 mg Oral Daily Marcille Buffy D, CNM   Stopped at 03/21/19 1000  . enoxaparin (LOVENOX) injection 80 mg  0.5 mg/kg Subcutaneous Q24H Aletha Halim, MD   80 mg at 03/24/19 1015  . hydrALAZINE (APRESOLINE) injection 5 mg  5 mg Intravenous PRN Marcille Buffy D, CNM   5 mg at 03/21/19 0036   And  . hydrALAZINE (APRESOLINE) injection 10 mg  10 mg Intravenous PRN Marcille Buffy D, CNM   10 mg at 03/21/19 0051   And  . labetalol (  NORMODYNE) injection 20 mg  20 mg Intravenous PRN Marcille Buffy D, CNM   20 mg at 03/21/19 0108   And  . labetalol (NORMODYNE) injection 40 mg  40 mg Intravenous PRN Marcille Buffy D, CNM   40 mg at 03/02/19 2349  . hydrALAZINE (APRESOLINE) tablet 100 mg  100 mg Oral Q6H Barrett, Rhonda G, PA-C   100 mg at 03/25/19 0751  . hydrochlorothiazide (HYDRODIURIL) tablet 50 mg  50 mg Oral Daily Sloan Leiter, MD   50 mg at 03/24/19 1015  . labetalol (NORMODYNE) tablet 800 mg  800 mg Oral Q8H Sloan Leiter, MD   800 mg at 03/25/19 0751  . NIFEdipine (PROCARDIA-XL/NIFEDICAL-XL) 24 hr tablet 90 mg  90 mg Oral BID [name], MD   90 mg at 03/24/19 2208  . prenatal multivitamin tablet 1 tablet  1 tablet Oral Q1200 Marcille Buffy D, CNM   1 tablet at  03/24/19 1015  . sodium chloride flush (NS) 0.9 % injection 3 mL  3 mL Intravenous Q12H Chancy Milroy, MD   3 mL at 03/24/19 2209  . zolpidem (AMBIEN) tablet 5 mg  5 mg Oral QHS PRN Tresea Mall, CNM        Labs:  Recent Labs  Lab 03/20/19 0515 03/22/19 0533 03/25/19 0541  WBC 11.8* 10.1 9.2  HGB 12.1 11.7* 12.1  HCT 37.3 35.4* 35.9*  PLT 278 276 269    Recent Labs  Lab 03/20/19 0515 03/22/19 0533 03/25/19 0541  NA 134* 135 134*  K 4.0 3.8 4.2  CL 101 101 102  CO2 22 24 21*  BUN 25* 29* 26*  CREATININE 1.21* 1.31* 1.18*  CALCIUM 9.4 9.5 9.5  PROT 6.6 6.6 6.5  BILITOT 0.3 0.4 0.5  ALKPHOS 71 71 73  ALT 57* 43 37  AST 26 22 20   GLUCOSE 101* 109* 117*   CBG (last 3)  Recent Labs    03/24/19 1611 03/24/19 2141 03/25/19 0559  GLUCAP 101* 178* 103*    Radiology:  8/33: cephalic, BPP 8/8 8/25: breech, efw 39%, 1305gm, ac 31%, afi 12.5, bpp 8/8 5/26: breech, AFI 12.5, bpp 8/8 5/7: efw 36%, 852gm, AC 43%, nl AFI and S/D dopplers  Assessment & Plan:  33 yo G2P1 at [redacted]w[redacted]d with CHTN with superimposed severe preeclampsia and type 2 DM - Maternal fetal status is stable - Continue current BP regimen.  - Repeat CBC and CMP today stable - CBGs stable. Will continue diet contol - s/p rescue steroids 5/28 and 5/29.  - Patient with previous c-section and desires RCS with BTL - DVT prophylaxis with lovenox, oob ad lib - Follow up BPP and growth ultrasound report - Dispo: inpatient until delivery. Hope to get to 34wks.

## 2019-03-26 LAB — GLUCOSE, CAPILLARY
Glucose-Capillary: 99 mg/dL (ref 70–99)
Glucose-Capillary: 116 mg/dL — ABNORMAL HIGH (ref 70–99)
Glucose-Capillary: 109 mg/dL — ABNORMAL HIGH (ref 70–99)

## 2019-03-26 NOTE — Progress Notes (Signed)
Daily Antepartum Note  Admission Date: 02/23/2019 Current Date: 03/26/2019 8:05 AM  Danielle Kent is a 33 y.o. G2P1001 @ [redacted]w[redacted]d, HD#32, admitted for severe pre-eclampsia (BPs) superimposed on chronic HTN.  Pregnancy complicated by: Patient Active Problem List   Diagnosis Date Noted  . Malpresentation before onset of labor 03/16/2019  . BMI 70 and over, adult ([place]) 03/15/2019  . Chronic renal insufficiency 03/15/2019  . Severe hypertension   . Chronic hypertension with superimposed preeclampsia 02/23/2019  . Preexisting diabetes complicating pregnancy, antepartum 12/27/2018  . Previous cesarean section 12/06/2018  . Chronic hypertension during pregnancy, antepartum 12/06/2018  . Maternal morbid obesity, antepartum ([place]) 12/06/2018  . Supervision of high risk pregnancy, antepartum 11/25/2018  . Hypertensive urgency 11/25/2018    Subjective:  Patient reports feeling well. She denies s/s of pre-eclampsia, preterm labor or decreased FM  Objective:    Current Vital Signs 24h Vital Sign Ranges  T 97.8 F (36.6 C) Temp  Avg: 98 F (36.7 C)  Min: 97.8 F (36.6 C)  Max: 98.6 F (37 C)  BP (!) 148/84(nurse notified) BP  Min: 127/71  Max: 148/84  HR 82 Pulse  Avg: 82.4  Min: 74  Max: 90  RR 18 Resp  Avg: 18.8  Min: 18  Max: 20  SaO2 98 % Room Air SpO2  Avg: 98.4 %  Min: 97 %  Max: 100 %       24 Hour I/O Current Shift I/O  Time Ins Outs No intake/output data recorded. No intake/output data recorded.   Patient Vitals for the past 24 hrs:  BP Temp Temp src Pulse Resp SpO2  03/26/19 0625 - - - - - 98 %  03/26/19 0430 (!) 148/84 97.8 F (36.6 C) Oral 82 18 100 %  03/26/19 0425 - - - - - 100 %  03/26/19 0410 - - - - - 99 %  03/26/19 0405 - - - - - 99 %  03/26/19 0400 - - - - - 99 %  03/26/19 0355 - - - - - 99 %  03/26/19 0350 - - - - - 98 %  03/26/19 0345 - - - - - 98 %  03/26/19 0340 - - - - - 99 %  03/26/19 0335 - - - - - 99 %  03/26/19 0330 - - - - - 98 %  03/26/19 0325 - - - - -  98 %  03/26/19 0320 - - - - - 98 %  03/26/19 0315 - - - - - 98 %  03/26/19 0310 - - - - - 98 %  03/26/19 0305 - - - - - 98 %  03/26/19 0300 - - - - - 98 %  03/26/19 0255 - - - - - 98 %  03/26/19 0250 - - - - - 98 %  03/26/19 0245 - - - - - 98 %  03/26/19 0240 - - - - - 98 %  03/26/19 0235 - - - - - 98 %  03/26/19 0230 - - - - - 99 %  03/26/19 0225 - - - - - 99 %  03/26/19 0220 - - - - - 97 %  03/26/19 0215 - - - - - 97 %  03/26/19 0210 - - - - - 98 %  03/25/19 2345 (!) 142/89 97.8 F (36.6 C) Oral 86 20 97 %  03/25/19 2015 (!) 147/82 98.6 F (37 C) Oral 90 20 99 %  03/25/19 1551 (!) 128/50 97.8 F (36.6  C) Oral 83 18 99 %  03/25/19 1456 (!) 145/87 - - 80 - -  03/25/19 1359 127/71 - - 87 - -  03/25/19 1142 138/81 97.9 F (36.6 C) Oral 74 18 99 %  03/25/19 0957 135/77 - - 77 - -   FHR: 140 baseline, no accel, one episodic decel, mod variablity Toco: no contractions   Physical exam: General: Well nourished, well developed female in no acute distress. Abdomen: obese, nttp, nd Cardiovascular: S1, S2 normal, no murmur, rub or gallop, regular rate and rhythm Respiratory: CTAB Extremities: no clubbing, cyanosis or edema Skin: Warm and dry.   Medications: Current Facility-Administered Medications  Medication Dose Route Frequency Provider Last Rate Last Dose  . acetaminophen (TYLENOL) tablet 650 mg  650 mg Oral Q4H PRN Tresea Mall, CNM   650 mg at 03/24/19 1641  . calcium carbonate (TUMS - dosed in mg elemental calcium) chewable tablet 400 mg of elemental calcium  2 tablet Oral Q4H PRN Tresea Mall, CNM      . cloNIDine (CATAPRES) tablet 0.3 mg  0.3 mg Oral TID Sloan Leiter, MD   0.3 mg at 03/26/19 6962  . docusate sodium (COLACE) capsule 100 mg  100 mg Oral Daily Tresea Mall, CNM   100 mg at 03/25/19 0957  . enoxaparin (LOVENOX) injection 80 mg  0.5 mg/kg Subcutaneous Q24H Aletha Halim, MD   80 mg at 03/25/19 0955  . hydrALAZINE (APRESOLINE) injection 5 mg  5  mg Intravenous PRN Marcille Buffy D, CNM   5 mg at 03/21/19 0036   And  . hydrALAZINE (APRESOLINE) injection 10 mg  10 mg Intravenous PRN Marcille Buffy D, CNM   10 mg at 03/21/19 0051   And  . labetalol (NORMODYNE) injection 20 mg  20 mg Intravenous PRN Marcille Buffy D, CNM   20 mg at 03/21/19 0108   And  . labetalol (NORMODYNE) injection 40 mg  40 mg Intravenous PRN Marcille Buffy D, CNM   40 mg at 03/02/19 2349  . hydrALAZINE (APRESOLINE) tablet 100 mg  100 mg Oral Q6H Barrett, Rhonda G, PA-C   100 mg at 03/26/19 0205  . hydrochlorothiazide (HYDRODIURIL) tablet 50 mg  50 mg Oral Daily Sloan Leiter, MD   50 mg at 03/25/19 0957  . labetalol (NORMODYNE) tablet 800 mg  800 mg Oral Q8H Sloan Leiter, MD   800 mg at 03/26/19 0021  . NIFEdipine (PROCARDIA-XL/NIFEDICAL-XL) 24 hr tablet 90 mg  90 mg Oral BID [name], MD   90 mg at 03/25/19 2230  . prenatal multivitamin tablet 1 tablet  1 tablet Oral Q1200 Marcille Buffy D, CNM   1 tablet at 03/25/19 9528  . sodium chloride flush (NS) 0.9 % injection 3 mL  3 mL Intravenous Q12H Chancy Milroy, MD   3 mL at 03/25/19 2230  . zolpidem (AMBIEN) tablet 5 mg  5 mg Oral QHS PRN Tresea Mall, CNM        Labs:  Recent Labs  Lab 03/20/19 0515 03/22/19 0533 03/25/19 0541  WBC 11.8* 10.1 9.2  HGB 12.1 11.7* 12.1  HCT 37.3 35.4* 35.9*  PLT 278 276 269    Recent Labs  Lab 03/20/19 0515 03/22/19 0533 03/25/19 0541  NA 134* 135 134*  K 4.0 3.8 4.2  CL 101 101 102  CO2 22 24 21*  BUN 25* 29* 26*  CREATININE 1.21* 1.31* 1.18*  CALCIUM 9.4 9.5 9.5  PROT 6.6 6.6  6.5  BILITOT 0.3 0.4 0.5  ALKPHOS 71 71 73  ALT 57* 43 37  AST 26 22 20   GLUCOSE 101* 109* 117*   CBG (last 3)  Recent Labs    03/25/19 1714 03/25/19 2134 03/26/19 0602  GLUCAP 157* 124* 99    Radiology:  6/5: cephalic, bpp 8/8 1/54: cephalic, BPP 8/8 0/08: breech, efw 39%, 1305gm, ac 31%, afi 12.5, bpp 8/8 5/26: breech, AFI 12.5, bpp 8/8 5/7: efw 36%,  852gm, AC 43%, nl AFI and S/D dopplers  Assessment & Plan:  Pt stable *Pregnancy: NST at baseline.Probably would benefit from getting them 2x/week and not qwk.  -btl papers signed 5/27 *cHTN with superimposed severe pre-x: continue BP regimen. Repeat labs on 6/8 -s/p Cards consult (signed off). Add isordil 20 tid PRN per last note.  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  f/u Cr next lab draw -Neprohology signed off. Nothing to do currently. *DM2: d.w pt would recommend starting insulin. Follow up DM team recs today *Preterm: s/p rescue steroids 5/28 and 5/29.  -BMZ on 5/6 and 5/7. S/p NICU consult. S/p Mg on admission  *h/o c-section: d/w her re: delivery and she is not leaning either way. C/s elsewhere for what sounds likes NRFHTs *PPx: lovenox, oob ad lib *FEN/GI: DM2 diet.  *Dispo: inpatient until delivery. Hope to get to 34wks.    Durene Romans MD Attending Center for [employer] (Faculty Practice) 03/26/2019 Time: [phone]

## 2019-03-27 DIAGNOSIS — Z3A31 31 weeks gestation of pregnancy: Secondary | ICD-10-CM

## 2019-03-27 LAB — GLUCOSE, CAPILLARY
Glucose-Capillary: 89 mg/dL (ref 70–99)
Glucose-Capillary: 123 mg/dL — ABNORMAL HIGH (ref 70–99)
Glucose-Capillary: 127 mg/dL — ABNORMAL HIGH (ref 70–99)

## 2019-03-27 NOTE — Progress Notes (Signed)
Patient ID: Danielle Kent, female   DOB: April 28, 1986, 33 y.o.   MRN: [id number] [place] COMPREHENSIVE PROGRESS NOTE  Danielle Kent is a 33 y.o. G2P1001 at [redacted]w[redacted]d  who is admitted for [facility] [place] with [place].   Fetal presentation is cephalic. Length of Stay:  32  Days  Subjective: Danielle Kent has no complaints this morning. Sitting up in bedside chair. Denies HA or visual changes Patient reports good fetal movement.  She reports no uterine contractions, no bleeding and no loss of fluid per vagina.  Vitals:  Blood pressure (!) 145/89, pulse 81, temperature 98 F (36.7 C), temperature source Oral, resp. rate 17, height 5' (1.524 m), weight (!) 160.9 kg, last menstrual period 08/31/2018, SpO2 99 %. Physical Examination: Lungs clear Heart RRR Abd soft + BS gravid Ext non tender   Fetal Monitoring:  baseline 130's no accels, no decels, mod variablity  Labs:  Results for orders placed or performed during the hospital encounter of 02/23/19 (from the past 24 hour(s))  Glucose, capillary   Collection Time: 03/26/19 11:46 AM  Result Value Ref Range   Glucose-Capillary 116 (H) 70 - 99 mg/dL  Glucose, capillary   Collection Time: 03/26/19  9:36 PM  Result Value Ref Range   Glucose-Capillary 109 (H) 70 - 99 mg/dL  Glucose, capillary   Collection Time: 03/27/19  6:50 AM  Result Value Ref Range   Glucose-Capillary 89 70 - 99 mg/dL    Imaging Studies:    NA   Medications:  Scheduled . cloNIDine  0.3 mg Oral TID  . docusate sodium  100 mg Oral Daily  . enoxaparin (LOVENOX) injection  0.5 mg/kg Subcutaneous Q24H  . hydrALAZINE  100 mg Oral Q6H  . hydrochlorothiazide  50 mg Oral Daily  . labetalol  800 mg Oral Q8H  . NIFEdipine  90 mg Oral BID  . prenatal multivitamin  1 tablet Oral Q1200  . sodium chloride flush  3 mL Intravenous Q12H   I have reviewed the patient's current medications.  ASSESSMENT: IUP 31 weeks CHTN with SIPEC DM2 Unwanted fertility  PLAN: BP stable with current  regiment. CBG's in goal range last 24 hrs. Will hold starting insulin for now.  Continue routine antenatal care.   Danielle Kent 03/27/2019,7:37 AM

## 2019-03-28 DIAGNOSIS — Z3A33 33 weeks gestation of pregnancy: Secondary | ICD-10-CM

## 2019-03-28 LAB — CBC
HCT: 36.2 % (ref 36.0–46.0)
Hemoglobin: 11.8 g/dL — ABNORMAL LOW (ref 12.0–15.0)
MCH: 28.1 pg (ref 26.0–34.0)
MCHC: 32.6 g/dL (ref 30.0–36.0)
MCV: 86.2 fL (ref 80.0–100.0)
Platelets: 259 10*3/uL (ref 150–400)
RBC: 4.2 MIL/uL (ref 3.87–5.11)
RDW: 13.9 % (ref 11.5–15.5)
WBC: 9.8 10*3/uL (ref 4.0–10.5)
nRBC: 0 % (ref 0.0–0.2)

## 2019-03-28 LAB — COMPREHENSIVE METABOLIC PANEL
ALT: 36 U/L (ref 0–44)
AST: 21 U/L (ref 15–41)
Albumin: 2.5 g/dL — ABNORMAL LOW (ref 3.5–5.0)
Alkaline Phosphatase: 87 U/L (ref 38–126)
Anion gap: 10 (ref 5–15)
BUN: 29 mg/dL — ABNORMAL HIGH (ref 6–20)
CO2: 22 mmol/L (ref 22–32)
Calcium: 9.4 mg/dL (ref 8.9–10.3)
Chloride: 102 mmol/L (ref 98–111)
Creatinine, Ser: 1.53 mg/dL — ABNORMAL HIGH (ref 0.44–1.00)
GFR calc Af Amer: 51 mL/min — ABNORMAL LOW (ref >60)
GFR calc non Af Amer: 44 mL/min — ABNORMAL LOW (ref >60)
Glucose, Bld: 131 mg/dL — ABNORMAL HIGH (ref 70–99)
Potassium: 3.9 mmol/L (ref 3.5–5.1)
Sodium: 134 mmol/L — ABNORMAL LOW (ref 135–145)
Total Bilirubin: 0.1 mg/dL — ABNORMAL LOW (ref 0.3–1.2)
Total Protein: 6.3 g/dL — ABNORMAL LOW (ref 6.5–8.1)

## 2019-03-28 LAB — TYPE AND SCREEN
ABO/RH(D): O POS
Antibody Screen: NEGATIVE

## 2019-03-28 LAB — GLUCOSE, CAPILLARY
Glucose-Capillary: 109 mg/dL — ABNORMAL HIGH (ref 70–99)
Glucose-Capillary: 108 mg/dL — ABNORMAL HIGH (ref 70–99)
Glucose-Capillary: 138 mg/dL — ABNORMAL HIGH (ref 70–99)
Glucose-Capillary: 188 mg/dL — ABNORMAL HIGH (ref 70–99)

## 2019-03-28 LAB — PREPARE RBC (CROSSMATCH)

## 2019-03-28 NOTE — Progress Notes (Signed)
Daily Antepartum Note  Admission Date: 02/23/2019 Current Date: 03/28/2019 11:53 AM  Latina Jeancharles is a 33 y.o. G2P1001 at [redacted]w[redacted]d, HD#33, admitted for severe pre-eclampsia (BPs) superimposed on chronic HTN.  Pregnancy complicated by: Patient Active Problem List   Diagnosis Date Noted  . Malpresentation before onset of labor 03/16/2019  . BMI 70 and over, adult ([place]) 03/15/2019  . Chronic renal insufficiency 03/15/2019  . Severe hypertension   . Chronic hypertension with superimposed preeclampsia 02/23/2019  . Preexisting diabetes complicating pregnancy, antepartum 12/27/2018  . Previous cesarean section 12/06/2018  . Chronic hypertension during pregnancy, antepartum 12/06/2018  . Maternal morbid obesity, antepartum ([place]) 12/06/2018  . Supervision of high risk pregnancy, antepartum 11/25/2018  . Hypertensive urgency 11/25/2018    Subjective:  Patient reports feeling well. She denies s/s of pre-eclampsia, preterm labor or decreased FM  Objective:    Current Vital Signs 24h Vital Sign Ranges  T 98 F (36.7 C) Temp  Avg: 98.1 F (36.7 C)  Min: 97.8 F (36.6 C)  Max: 98.4 F (36.9 C)  BP 140/86 BP  Min: 140/86  Max: 151/75  HR 80 Pulse  Avg: 86.9  Min: 80  Max: 95  RR 18 Resp  Avg: 18.3  Min: 18  Max: 20  SaO2 99 % Room Air SpO2  Avg: 98.3 %  Min: 96 %  Max: 100 %       24 Hour I/O Current Shift I/O  Time Ins Outs No intake/output data recorded. 06/08 0701 - 06/08 1900 In: 25 [P.O.:25] Out: 0    Patient Vitals for the past 24 hrs:  BP Temp Temp src Pulse Resp SpO2  03/28/19 1100 140/86 98 F (36.7 C) Oral 80 18 99 %  03/28/19 0802 (!) 150/94 98.1 F (36.7 C) Oral 85 18 97 %  03/28/19 0628 - - - 84 - -  03/28/19 0627 (!) 147/80 98.3 F (36.8 C) Oral 84 18 100 %  03/27/19 2310 (!) 151/75 97.8 F (36.6 C) Oral 89 18 99 %  03/27/19 1938 (!) 142/91 98.4 F (36.9 C) Oral 90 18 98 %  03/27/19 1659 (!) 145/53 98.3 F (36.8 C) Oral 95 18 96 %  03/27/19 1208 (!) 148/71 - - 88 -  -  03/27/19 1207 - 98.1 F (36.7 C) Oral - 20 99 %   Body mass index is 69.28 kg/m.  FHR: 135 baseline, no accel, one variable decel noted, min variablity (stable for patient) Toco: no contractions   Physical exam: General: Well nourished, well developed female in no acute distress. Abdomen: obese, nttp, nd Cardiovascular: S1, S2 normal, no murmur, rub or gallop, regular rate and rhythm Respiratory: CTAB Extremities: no clubbing, cyanosis or edema Skin: Warm and dry.   Medications: Current Facility-Administered Medications  Medication Dose Route Frequency Provider Last Rate Last Dose  . acetaminophen (TYLENOL) tablet 650 mg  650 mg Oral Q4H PRN Tresea Mall, CNM   650 mg at 03/24/19 1641  . calcium carbonate (TUMS - dosed in mg elemental calcium) chewable tablet 400 mg of elemental calcium  2 tablet Oral Q4H PRN Tresea Mall, CNM      . cloNIDine (CATAPRES) tablet 0.3 mg  0.3 mg Oral TID Sloan Leiter, MD   0.3 mg at 03/28/19 0631  . docusate sodium (COLACE) capsule 100 mg  100 mg Oral Daily Tresea Mall, CNM   100 mg at 03/25/19 0957  . enoxaparin (LOVENOX) injection 80 mg  0.5 mg/kg Subcutaneous Q24H  Aletha Halim, MD   80 mg at 03/27/19 1062  . hydrALAZINE (APRESOLINE) injection 5 mg  5 mg Intravenous PRN Marcille Buffy D, CNM   5 mg at 03/21/19 0036   And  . hydrALAZINE (APRESOLINE) injection 10 mg  10 mg Intravenous PRN Marcille Buffy D, CNM   10 mg at 03/21/19 0051   And  . labetalol (NORMODYNE) injection 20 mg  20 mg Intravenous PRN Marcille Buffy D, CNM   20 mg at 03/21/19 0108   And  . labetalol (NORMODYNE) injection 40 mg  40 mg Intravenous PRN Marcille Buffy D, CNM   40 mg at 03/02/19 2349  . hydrALAZINE (APRESOLINE) tablet 100 mg  100 mg Oral Q6H Barrett, Rhonda G, PA-C   100 mg at 03/28/19 0746  . hydrochlorothiazide (HYDRODIURIL) tablet 50 mg  50 mg Oral Daily Sloan Leiter, MD   50 mg at 03/28/19 1012  . labetalol (NORMODYNE) tablet 800 mg  800 mg  Oral Q8H Sloan Leiter, MD   800 mg at 03/28/19 0747  . NIFEdipine (PROCARDIA-XL/NIFEDICAL-XL) 24 hr tablet 90 mg  90 mg Oral BID [name], MD   90 mg at 03/28/19 1012  . prenatal multivitamin tablet 1 tablet  1 tablet Oral Q1200 Marcille Buffy D, CNM   1 tablet at 03/27/19 0949  . sodium chloride flush (NS) 0.9 % injection 3 mL  3 mL Intravenous Q12H Chancy Milroy, MD   10 mL at 03/27/19 2005  . zolpidem (AMBIEN) tablet 5 mg  5 mg Oral QHS PRN Tresea Mall, CNM        Labs:  Recent Labs  Lab 03/22/19 0533 03/25/19 0541 03/28/19 0646  WBC 10.1 9.2 9.8  HGB 11.7* 12.1 11.8*  HCT 35.4* 35.9* 36.2  PLT 276 269 259    Recent Labs  Lab 03/22/19 0533 03/25/19 0541 03/28/19 0646  NA 135 134* 134*  K 3.8 4.2 3.9  CL 101 102 102  CO2 24 21* 22  BUN 29* 26* 29*  CREATININE 1.31* 1.18* 1.53*  CALCIUM 9.5 9.5 9.4  PROT 6.6 6.5 6.3*  BILITOT 0.4 0.5 0.1*  ALKPHOS 71 73 87  ALT 43 37 36  AST 22 20 21   GLUCOSE 109* 117* 131*   CBG (last 3)  Recent Labs    03/27/19 2139 03/28/19 0804 03/28/19 1103  GLUCAP 127* 109* 108*    Radiology:  6/5:  cephalic, BPP 8/8 6/94: cephalic, BPP 8/8 8/54: breech, efw 39%, 1305gm, ac 31%, afi 12.5, bpp 8/8 5/26: breech, AFI 12.5, bpp 8/8 5/7: efw 36%, 852gm, AC 43%, nl AFI and S/D dopplers  Assessment & Plan:  CHTN with superimposed severe preeclampsia and type 2 DM - Creatinine increased today to 1.53.  This is very concerning. Talked to Dr. Donalee Citrin (MFM) about delivery plan.  Given her underlying CRI and stability of BP and fetus for now, he advised to recheck labs tomorrow morning. If Cr still concerning, proceed withy delivery at that time. FHR tracing is still stable for now, good fetal growth, BPP 8/8 on 03/25/19.  If delivery indicated, will discuss repeat cesarean vs TOLAC if fetus is still cephalic (patient desires RCS and BTL). She is typed and crossmatched for 2 units, Lovenox held for now. NPO after midnight. Will monitor  strict Is and Os, she reports adequate urine output.  - Continue current BP regimen.  - CBGs stable. Will continue diet contol - s/p rescue steroids 5/28 and 5/29.  -  Patient with previous c-section and desires RCS with BTL - DVT prophylaxis with lovenox, oob ad lib - Dispo: inpatient until delivery.   Verita Schneiders, MD, [place] for [employer], [place]

## 2019-03-29 LAB — COMPREHENSIVE METABOLIC PANEL
ALT: 36 U/L (ref 0–44)
AST: 19 U/L (ref 15–41)
Albumin: 2.6 g/dL — ABNORMAL LOW (ref 3.5–5.0)
Alkaline Phosphatase: 85 U/L (ref 38–126)
Anion gap: 11 (ref 5–15)
BUN: 23 mg/dL — ABNORMAL HIGH (ref 6–20)
CO2: 22 mmol/L (ref 22–32)
Calcium: 9.5 mg/dL (ref 8.9–10.3)
Chloride: 103 mmol/L (ref 98–111)
Creatinine, Ser: 1.23 mg/dL — ABNORMAL HIGH (ref 0.44–1.00)
GFR calc Af Amer: >60 mL/min (ref >60)
GFR calc non Af Amer: 57 mL/min — ABNORMAL LOW (ref >60)
Glucose, Bld: 118 mg/dL — ABNORMAL HIGH (ref 70–99)
Potassium: 3.8 mmol/L (ref 3.5–5.1)
Sodium: 136 mmol/L (ref 135–145)
Total Bilirubin: 0.4 mg/dL (ref 0.3–1.2)
Total Protein: 6.5 g/dL (ref 6.5–8.1)

## 2019-03-29 LAB — GLUCOSE, CAPILLARY
Glucose-Capillary: 100 mg/dL — ABNORMAL HIGH (ref 70–99)
Glucose-Capillary: 104 mg/dL — ABNORMAL HIGH (ref 70–99)
Glucose-Capillary: 116 mg/dL — ABNORMAL HIGH (ref 70–99)
Glucose-Capillary: 132 mg/dL — ABNORMAL HIGH (ref 70–99)

## 2019-03-29 LAB — CBC
HCT: 36.9 % (ref 36.0–46.0)
Hemoglobin: 12.2 g/dL (ref 12.0–15.0)
MCH: 28.2 pg (ref 26.0–34.0)
MCHC: 33.1 g/dL (ref 30.0–36.0)
MCV: 85.2 fL (ref 80.0–100.0)
Platelets: 239 10*3/uL (ref 150–400)
RBC: 4.33 MIL/uL (ref 3.87–5.11)
RDW: 13.9 % (ref 11.5–15.5)
WBC: 9.8 10*3/uL (ref 4.0–10.5)
nRBC: 0 % (ref 0.0–0.2)

## 2019-03-29 NOTE — Progress Notes (Signed)
Daily Antepartum Note  Admission Date: 02/23/2019 Current Date: 03/29/2019 10:34 AM  Danielle Kent is a 33 y.o. G2P1001 at [redacted]w[redacted]d, HD#34, admitted for severe pre-eclampsia (BPs) superimposed on chronic HTN.  Pregnancy complicated by: Patient Active Problem List   Diagnosis Date Noted  . Malpresentation before onset of labor 03/16/2019  . BMI 70 and over, adult ([place]) 03/15/2019  . Chronic renal insufficiency 03/15/2019  . Severe hypertension   . Chronic hypertension with superimposed severe preeclampsia 02/23/2019  . Preexisting diabetes complicating pregnancy, antepartum 12/27/2018  . Previous cesarean section 12/06/2018  . Chronic hypertension during pregnancy, antepartum 12/06/2018  . Maternal morbid obesity, antepartum ([place]) 12/06/2018  . Supervision of high risk pregnancy, antepartum 11/25/2018    Subjective:  Patient reports feeling well. She denies s/s of pre-eclampsia, preterm labor or decreased FM  Objective:    Current Vital Signs 24h Vital Sign Ranges  T 97.9 F (36.6 C) Temp  Avg: 98 F (36.7 C)  Min: 97.8 F (36.6 C)  Max: 98.2 F (36.8 C)  BP (!) 158/103(BP recheck) BP  Min: 129/66  Max: 160/99  HR 80 Pulse  Avg: 87.1  Min: 80  Max: 115  RR 17 Resp  Avg: 18.5  Min: 17  Max: 20  SaO2 100 % Room Air SpO2  Avg: 98.3 %  Min: 97 %  Max: 100 %       24 Hour I/O Current Shift I/O  Time Ins Outs 06/08 0701 - 06/09 0700 In: 1120 [P.O.:1120] Out: 1610 [Urine:1775] 06/09 0701 - 06/09 1900 In: 180 [P.O.:180] Out: 800 [Urine:800]   Patient Vitals for the past 24 hrs:  BP Temp Temp src Pulse Resp SpO2  03/29/19 0805 (!) 158/103 - - 80 - -  03/29/19 0745 (!) 160/99 97.9 F (36.6 C) Oral 83 17 100 %  03/29/19 0500 - - - - - 98 %  03/29/19 0405 - - - - - 100 %  03/29/19 0302 (!) 153/86 97.8 F (36.6 C) Oral 80 20 99 %  03/29/19 0300 - - - - - 97 %  03/29/19 0255 - - - - - 98 %  03/29/19 0250 - - - - - 98 %  03/29/19 0245 - - - - - 98 %  03/29/19 0240 - - - - - 98 %   03/29/19 0235 - - - - - 98 %  03/29/19 0230 - - - - - 98 %  03/29/19 0225 - - - - - 98 %  03/29/19 0220 - - - - - 98 %  03/29/19 0215 - - - - - 99 %  03/29/19 0210 - - - - - 99 %  03/29/19 0155 - - - - - 98 %  03/29/19 0150 - - - - - 98 %  03/29/19 0145 - - - - - 99 %  03/29/19 0140 - - - - - 99 %  03/29/19 0135 - - - - - 99 %  03/29/19 0130 - - - - - 98 %  03/29/19 0125 - - - - - 98 %  03/29/19 0120 - - - - - 98 %  03/29/19 0115 - - - - - 97 %  03/29/19 0110 - - - - - 98 %  03/29/19 0105 - - - - - 99 %  03/29/19 0100 - - - - - 98 %  03/28/19 2330 129/66 97.9 F (36.6 C) Oral 86 20 100 %  03/28/19 2220 - - - - - 98 %  03/28/19 2215 - - - - - 97 %  03/28/19 2210 - - - - - 98 %  03/28/19 2205 - - - - - 98 %  03/28/19 2200 - - - - - 98 %  03/28/19 2155 - - - - - 98 %  03/28/19 2150 - - - - - 97 %  03/28/19 2145 - - - - - 99 %  03/28/19 2140 - - - - - 98 %  03/28/19 2135 - - - - - 98 %  03/28/19 2130 - - - - - 98 %  03/28/19 2129 - - - - - 98 %  03/28/19 2125 - - - - - 99 %  03/28/19 1951 138/65 98.1 F (36.7 C) Oral (!) 115 18 100 %  03/28/19 1557 132/75 98.2 F (36.8 C) Oral 86 18 98 %  03/28/19 1100 140/86 98 F (36.7 C) Oral 80 18 99 %   Body mass index is 69.28 kg/m.  FHR: 130 baseline, no accel, one variable decel noted, min variablity (stable for patient) Toco: no contractions   Physical exam: General: Well nourished, well developed female in no acute distress. Abdomen: obese, nttp, nd Cardiovascular: S1, S2 normal, no murmur, rub or gallop, regular rate and rhythm Respiratory: CTAB Extremities: no clubbing, cyanosis or edema Skin: Warm and dry.   Medications: Current Facility-Administered Medications  Medication Dose Route Frequency Provider Last Rate Last Dose  . acetaminophen (TYLENOL) tablet 650 mg  650 mg Oral Q4H PRN Tresea Mall, CNM   650 mg at 03/24/19 1641  . calcium carbonate (TUMS - dosed in mg elemental calcium) chewable tablet 400 mg of  elemental calcium  2 tablet Oral Q4H PRN Tresea Mall, CNM      . cloNIDine (CATAPRES) tablet 0.3 mg  0.3 mg Oral TID Sloan Leiter, MD   0.3 mg at 03/29/19 3976  . docusate sodium (COLACE) capsule 100 mg  100 mg Oral Daily Tresea Mall, CNM   100 mg at 03/25/19 0957  . enoxaparin (LOVENOX) injection 80 mg  0.5 mg/kg Subcutaneous Q24H Aletha Halim, MD   80 mg at 03/29/19 0705  . hydrALAZINE (APRESOLINE) injection 5 mg  5 mg Intravenous PRN Marcille Buffy D, CNM   5 mg at 03/21/19 0036   And  . hydrALAZINE (APRESOLINE) injection 10 mg  10 mg Intravenous PRN Marcille Buffy D, CNM   10 mg at 03/21/19 0051   And  . labetalol (NORMODYNE) injection 20 mg  20 mg Intravenous PRN Marcille Buffy D, CNM   20 mg at 03/21/19 0108   And  . labetalol (NORMODYNE) injection 40 mg  40 mg Intravenous PRN Marcille Buffy D, CNM   40 mg at 03/02/19 2349  . hydrALAZINE (APRESOLINE) tablet 100 mg  100 mg Oral Q6H Barrett, Rhonda G, PA-C   100 mg at 03/29/19 0750  . hydrochlorothiazide (HYDRODIURIL) tablet 50 mg  50 mg Oral Daily Sloan Leiter, MD   50 mg at 03/29/19 7341  . labetalol (NORMODYNE) tablet 800 mg  800 mg Oral Q8H Sloan Leiter, MD   800 mg at 03/29/19 0750  . NIFEdipine (PROCARDIA-XL/NIFEDICAL-XL) 24 hr tablet 90 mg  90 mg Oral BID [name], MD   90 mg at 03/29/19 9379  . prenatal multivitamin tablet 1 tablet  1 tablet Oral Q1200 Marcille Buffy D, CNM   1 tablet at 03/28/19 1355  . sodium chloride flush (NS) 0.9 % injection 3 mL  3 mL Intravenous Q12H Chancy Milroy, MD   3 mL at 03/29/19 0998  . zolpidem (AMBIEN) tablet 5 mg  5 mg Oral QHS PRN Tresea Mall, CNM        Labs:  Recent Labs  Lab 03/25/19 [phone] 03/28/19 0646 03/29/19 0537  WBC 9.2 9.8 9.8  HGB 12.1 11.8* 12.2  HCT 35.9* 36.2 36.9  PLT 269 259 239    Recent Labs  Lab 03/25/19 0541 03/28/19 0646 03/29/19 0537  NA 134* 134* 136  K 4.2 3.9 3.8  CL 102 102 103  CO2 21* 22 22  BUN 26* 29* 23*   CREATININE 1.18* 1.53* 1.23*  CALCIUM 9.5 9.4 9.5  PROT 6.5 6.3* 6.5  BILITOT 0.5 0.1* 0.4  ALKPHOS 73 87 85  ALT 37 36 36  AST 20 21 19   GLUCOSE 117* 131* 118*   CBG (last 3)  Recent Labs    03/28/19 1600 03/28/19 2032 03/29/19 0630  GLUCAP 138* 188* 100*    Radiology:  6/5:  cephalic, BPP 8/8 5/05: cephalic, BPP 8/8 3/97: breech, efw 39%, 1305gm, ac 31%, afi 12.5, bpp 8/8 5/26: breech, AFI 12.5, bpp 8/8 5/7: efw 36%, 852gm, AC 43%, nl AFI and S/D dopplers  Assessment & Plan:  CHTN with superimposed severe preeclampsia and type 2 DM - Creatinine improved today from 1.53 to baseline level of 1.23.  Talked to Dr. Donalee Citrin (MFM), will hold off delivery for now.  Recheck labs tomorrow morning.  FHR tracing is still stable for now, good fetal growth, BPP 8/8 on 03/25/19.  If delivery indicated, will discuss repeat cesarean vs TOLAC if fetus is still cephalic (patient still desires RCS and BTL). She is typed and crossmatched for 2 units. Will monitor strict Is and Os, she has adequate urine output.  - Continue current BP regimen, BP elevated this morning prior to medications  - CBGs stable. Will continue diet contol - s/p rescue steroids 5/28 and 5/29.  - Patient with previous c-section and desires RCS with BTL - DVT prophylaxis with lovenox, oob ad lib - Dispo: inpatient until delivery.   Verita Schneiders, MD, [place] for [facility], [place]

## 2019-03-30 LAB — COMPREHENSIVE METABOLIC PANEL
ALT: 32 U/L (ref 0–44)
AST: 19 U/L (ref 15–41)
Albumin: 2.5 g/dL — ABNORMAL LOW (ref 3.5–5.0)
Alkaline Phosphatase: 75 U/L (ref 38–126)
Anion gap: 12 (ref 5–15)
BUN: 24 mg/dL — ABNORMAL HIGH (ref 6–20)
CO2: 23 mmol/L (ref 22–32)
Calcium: 9.4 mg/dL (ref 8.9–10.3)
Chloride: 101 mmol/L (ref 98–111)
Creatinine, Ser: 1.28 mg/dL — ABNORMAL HIGH (ref 0.44–1.00)
GFR calc Af Amer: >60 mL/min (ref >60)
GFR calc non Af Amer: 54 mL/min — ABNORMAL LOW (ref >60)
Glucose, Bld: 106 mg/dL — ABNORMAL HIGH (ref 70–99)
Potassium: 3.7 mmol/L (ref 3.5–5.1)
Sodium: 136 mmol/L (ref 135–145)
Total Bilirubin: 0.3 mg/dL (ref 0.3–1.2)
Total Protein: 6.1 g/dL — ABNORMAL LOW (ref 6.5–8.1)

## 2019-03-30 LAB — TYPE AND SCREEN
ABO/RH(D): O POS
Antibody Screen: NEGATIVE
Unit division: 0
Unit division: 0

## 2019-03-30 LAB — BPAM RBC
Blood Product Expiration Date: 202007092359
Blood Product Expiration Date: 202007102359
ISSUE DATE / TIME: 202006052128
Unit Type and Rh: 5100
Unit Type and Rh: 5100

## 2019-03-30 LAB — GLUCOSE, CAPILLARY
Glucose-Capillary: 90 mg/dL (ref 70–99)
Glucose-Capillary: 90 mg/dL (ref 70–99)
Glucose-Capillary: 146 mg/dL — ABNORMAL HIGH (ref 70–99)
Glucose-Capillary: 119 mg/dL — ABNORMAL HIGH (ref 70–99)

## 2019-03-30 NOTE — Progress Notes (Signed)
Daily Antepartum Note  Admission Date: 02/23/2019 Current Date: 03/30/2019 10:04 AM  Danielle Kent is a 33 y.o. G2P1001 at [redacted]w[redacted]d, HD#35, admitted for severe pre-eclampsia (BPs) superimposed on chronic HTN.  Pregnancy complicated by: Patient Active Problem List   Diagnosis Date Noted  . Malpresentation before onset of labor 03/16/2019  . BMI 70 and over, adult ([place]) 03/15/2019  . Chronic renal insufficiency 03/15/2019  . Severe hypertension   . Chronic hypertension with superimposed severe preeclampsia 02/23/2019  . Preexisting diabetes complicating pregnancy, antepartum 12/27/2018  . Previous cesarean section 12/06/2018  . Chronic hypertension during pregnancy, antepartum 12/06/2018  . Maternal morbid obesity, antepartum ([place]) 12/06/2018  . Supervision of high risk pregnancy, antepartum 11/25/2018    Subjective:  Patient reports feeling well. She denies s/s of pre-eclampsia, preterm labor or decreased FM. In good spirits overall.  Objective:    Current Vital Signs 24h Vital Sign Ranges  T 98.1 F (36.7 C) Temp  Avg: 98.2 F (36.8 C)  Min: 98 F (36.7 C)  Max: 98.8 F (37.1 C)  BP (!) 162/93 BP  Min: 144/92  Max: 164/94  HR 76 Pulse  Avg: 83.1  Min: 76  Max: 89  RR 16 Resp  Avg: 17.7  Min: 16  Max: 18  SaO2 99 % Room Air SpO2  Avg: 99 %  Min: 97 %  Max: 100 %       24 Hour I/O Current Shift I/O  Time Ins Outs 06/09 0701 - 06/10 0700 In: 440 [P.O.:440] Out: 1350 [Urine:1350] 06/10 0701 - 06/10 1900 In: 20 [P.O.:20] Out: 0    Patient Vitals for the past 24 hrs:  BP Temp Temp src Pulse Resp SpO2  03/30/19 0824 (!) 162/93 - - 76 - -  03/30/19 0805 (!) 164/94 98.1 F (36.7 C) Oral 82 16 99 %  03/30/19 0653 (!) 152/94 98 F (36.7 C) - 81 18 99 %  03/30/19 0201 (!) 145/91 - - 80 18 97 %  03/29/19 2137 (!) 150/94 98 F (36.7 C) - 89 18 100 %  03/29/19 1705 (!) 144/92 98.8 F (37.1 C) Oral 88 18 100 %  03/29/19 1205 (!) 149/83 98.1 F (36.7 C) Oral 86 18 99 %   Body mass  index is 69.28 kg/m.  FHR: 130 baseline, no accel, no decel noted, min variablity (stable for patient) Toco: no contractions   Physical exam: General: Well nourished, well developed female in no acute distress. Abdomen: obese, nttp, nd Cardiovascular: Regular rate Respiratory: Normal breath sounds, no problems with breathing Extremities: no clubbing, cyanosis or edema Skin: Warm and dry.   Medications: Current Facility-Administered Medications  Medication Dose Route Frequency Provider Last Rate Last Dose  . acetaminophen (TYLENOL) tablet 650 mg  650 mg Oral Q4H PRN Tresea Mall, CNM   650 mg at 03/24/19 1641  . calcium carbonate (TUMS - dosed in mg elemental calcium) chewable tablet 400 mg of elemental calcium  2 tablet Oral Q4H PRN Tresea Mall, CNM      . cloNIDine (CATAPRES) tablet 0.3 mg  0.3 mg Oral TID Sloan Leiter, MD   0.3 mg at 03/30/19 0650  . docusate sodium (COLACE) capsule 100 mg  100 mg Oral Daily Tresea Mall, CNM   100 mg at 03/25/19 0957  . enoxaparin (LOVENOX) injection 80 mg  0.5 mg/kg Subcutaneous Q24H Aletha Halim, MD   80 mg at 03/29/19 0705  . hydrALAZINE (APRESOLINE) injection 5 mg  5 mg Intravenous  PRN Marcille Buffy D, CNM   5 mg at 03/21/19 0036   And  . hydrALAZINE (APRESOLINE) injection 10 mg  10 mg Intravenous PRN Marcille Buffy D, CNM   10 mg at 03/21/19 0051   And  . labetalol (NORMODYNE) injection 20 mg  20 mg Intravenous PRN Marcille Buffy D, CNM   20 mg at 03/21/19 0108   And  . labetalol (NORMODYNE) injection 40 mg  40 mg Intravenous PRN Marcille Buffy D, CNM   40 mg at 03/02/19 2349  . hydrALAZINE (APRESOLINE) tablet 100 mg  100 mg Oral Q6H Barrett, Rhonda G, PA-C   100 mg at 03/30/19 0758  . hydrochlorothiazide (HYDRODIURIL) tablet 50 mg  50 mg Oral Daily Sloan Leiter, MD   50 mg at 03/29/19 5456  . labetalol (NORMODYNE) tablet 800 mg  800 mg Oral Q8H Sloan Leiter, MD   800 mg at 03/30/19 0758  . NIFEdipine  (PROCARDIA-XL/NIFEDICAL-XL) 24 hr tablet 90 mg  90 mg Oral BID [name], MD   90 mg at 03/29/19 2137  . prenatal multivitamin tablet 1 tablet  1 tablet Oral Q1200 Marcille Buffy D, CNM   1 tablet at 03/29/19 1404  . sodium chloride flush (NS) 0.9 % injection 3 mL  3 mL Intravenous Q12H Chancy Milroy, MD   3 mL at 03/29/19 2137  . zolpidem (AMBIEN) tablet 5 mg  5 mg Oral QHS PRN Tresea Mall, CNM        Labs:  Recent Labs  Lab 03/25/19 [phone] 03/28/19 0646 03/29/19 0537  WBC 9.2 9.8 9.8  HGB 12.1 11.8* 12.2  HCT 35.9* 36.2 36.9  PLT 269 259 239    Recent Labs  Lab 03/28/19 0646 03/29/19 0537 03/30/19 0614  NA 134* 136 136  K 3.9 3.8 3.7  CL 102 103 101  CO2 22 22 23   BUN 29* 23* 24*  CREATININE 1.53* 1.23* 1.28*  CALCIUM 9.4 9.5 9.4  PROT 6.3* 6.5 6.1*  BILITOT 0.1* 0.4 0.3  ALKPHOS 87 85 75  ALT 36 36 32  AST 21 19 19   GLUCOSE 131* 118* 106*   CBG (last 3)  Recent Labs    03/29/19 1710 03/29/19 2138 03/30/19 0909  GLUCAP 116* 132* 90    Radiology:  6/5:  cephalic, BPP 8/8 8/93: cephalic, BPP 8/8 7/34: breech, efw 39%, 1305gm, ac 31%, afi 12.5, bpp 8/8 5/26: breech, AFI 12.5, bpp 8/8 5/7: efw 36%, 852gm, AC 43%, nl AFI and S/D dopplers  Assessment & Plan:  CHTN with superimposed severe preeclampsia and type 2 DM - Creatinine stable today at 1.28.  Still has episodes of severe range BP prior to morning meds, usually gets better during the day,   As per discussion with Dr. Donalee Citrin (MFM), will hold off delivery for now.  Delivery indicated for persistent severe BP, recurrent FHR decels or worsening FHR tracing or other emergent indication.  Recheck labs on 04/01/19. FHR tracing is still stable for now, good fetal growth, BPP 8/8 on 03/25/19.  If delivery indicated, will discuss repeat cesarean vs TOLAC if fetus is still cephalic (patient still desires RCS and BTL). She is currently typed and crossmatched for 2 units. Will monitor strict Is and Os, she has  adequate urine output.  - Continue current BP regimen, BP elevated this morning prior to medications  - CBGs stable. Will continue diet contol - s/p rescue steroids 5/28 and 5/29.  - Patient with previous c-section and  desires RCS with BTL - DVT prophylaxis with lovenox, oob ad lib - Dispo: inpatient until delivery.   Verita Schneiders, MD, [place] for [employer] [facility], [place]

## 2019-03-30 NOTE — Progress Notes (Signed)
Dr. Harolyn Rutherford notified of increased BP 162/93.  0800 PO BP meds given. Rn continuing to monitor.

## 2019-03-31 LAB — GLUCOSE, CAPILLARY
Glucose-Capillary: 99 mg/dL (ref 70–99)
Glucose-Capillary: 120 mg/dL — ABNORMAL HIGH (ref 70–99)
Glucose-Capillary: 118 mg/dL — ABNORMAL HIGH (ref 70–99)
Glucose-Capillary: 117 mg/dL — ABNORMAL HIGH (ref 70–99)

## 2019-03-31 LAB — PREPARE RBC (CROSSMATCH)

## 2019-03-31 NOTE — Progress Notes (Signed)
Daily Antepartum Note  Admission Date: 02/23/2019 Current Date: 03/31/2019 9:29 AM  Danielle Kent is a 33 y.o. G2P1001 at [redacted]w[redacted]d, HD#36, admitted for severe pre-eclampsia (BPs) superimposed on chronic HTN.  Pregnancy complicated by: Patient Active Problem List   Diagnosis Date Noted  . Malpresentation before onset of labor 03/16/2019  . BMI 70 and over, adult ([place]) 03/15/2019  . Chronic renal insufficiency 03/15/2019  . Severe hypertension   . Chronic hypertension with superimposed severe preeclampsia 02/23/2019  . Preexisting diabetes complicating pregnancy, antepartum 12/27/2018  . Previous cesarean section 12/06/2018  . Chronic hypertension during pregnancy, antepartum 12/06/2018  . Maternal morbid obesity, antepartum ([place]) 12/06/2018  . Supervision of high risk pregnancy, antepartum 11/25/2018    Subjective:  Patient reports feeling well. She denies s/s of pre-eclampsia, preterm labor or decreased FM.   Objective:    Current Vital Signs 24h Vital Sign Ranges  T 98.2 F (36.8 C) Temp  Avg: 97.9 F (36.6 C)  Min: 97.5 F (36.4 C)  Max: 98.2 F (36.8 C)  BP (!) 147/90 BP  Min: 124/66  Max: 149/87  HR 86 Pulse  Avg: 82.2  Min: 76  Max: 86  RR 20 Resp  Avg: 18.2  Min: 17  Max: 20  SaO2 98 % Room Air SpO2  Avg: 98.8 %  Min: 98 %  Max: 100 %       24 Hour I/O Current Shift I/O  Time Ins Outs 06/10 0701 - 06/11 0700 In: 1160 [P.O.:1160] Out: 2950 [Urine:2950] No intake/output data recorded.   Patient Vitals for the past 24 hrs:  BP Temp Temp src Pulse Resp SpO2  03/31/19 0742 (!) 147/90 98.2 F (36.8 C) Oral 86 20 98 %  03/31/19 0610 (!) 149/87 - - 79 - 99 %  03/31/19 0034 128/67 - - 85 18 -  03/30/19 2034 (!) 148/91 98 F (36.7 C) - 83 18 -  03/30/19 1630 124/66 98 F (36.7 C) Oral 84 17 98 %  03/30/19 1215 (!) 141/89 (!) 97.5 F (36.4 C) Oral 76 18 100 %   Body mass index is 69.28 kg/m.  FHR: 130 baseline, no accel, no decel noted, min variablity (stable for  patient) Toco: no contractions   Physical exam: General: Well nourished, well developed female in no acute distress. Abdomen: obese, nttp, nd Cardiovascular: Regular rate Respiratory: Normal breath sounds, no problems with breathing Extremities: no clubbing, cyanosis or edema Skin: Warm and dry.   Medications: Current Facility-Administered Medications  Medication Dose Route Frequency Provider Last Rate Last Dose  . acetaminophen (TYLENOL) tablet 650 mg  650 mg Oral Q4H PRN Tresea Mall, CNM   650 mg at 03/24/19 1641  . calcium carbonate (TUMS - dosed in mg elemental calcium) chewable tablet 400 mg of elemental calcium  2 tablet Oral Q4H PRN Tresea Mall, CNM      . cloNIDine (CATAPRES) tablet 0.3 mg  0.3 mg Oral TID Sloan Leiter, MD   0.3 mg at 03/31/19 0645  . docusate sodium (COLACE) capsule 100 mg  100 mg Oral Daily Tresea Mall, CNM   100 mg at 03/25/19 0957  . enoxaparin (LOVENOX) injection 80 mg  0.5 mg/kg Subcutaneous Q24H Aletha Halim, MD   80 mg at 03/30/19 1018  . hydrALAZINE (APRESOLINE) injection 5 mg  5 mg Intravenous PRN Marcille Buffy D, CNM   5 mg at 03/21/19 0036   And  . hydrALAZINE (APRESOLINE) injection 10 mg  10 mg Intravenous  PRN Marcille Buffy D, CNM   10 mg at 03/21/19 0051   And  . labetalol (NORMODYNE) injection 20 mg  20 mg Intravenous PRN Marcille Buffy D, CNM   20 mg at 03/21/19 0108   And  . labetalol (NORMODYNE) injection 40 mg  40 mg Intravenous PRN Marcille Buffy D, CNM   40 mg at 03/02/19 2349  . hydrALAZINE (APRESOLINE) tablet 100 mg  100 mg Oral Q6H Barrett, Rhonda G, PA-C   100 mg at 03/31/19 0740  . hydrochlorothiazide (HYDRODIURIL) tablet 50 mg  50 mg Oral Daily Sloan Leiter, MD   50 mg at 03/30/19 1018  . labetalol (NORMODYNE) tablet 800 mg  800 mg Oral Q8H Sloan Leiter, MD   800 mg at 03/31/19 0740  . NIFEdipine (PROCARDIA-XL/NIFEDICAL-XL) 24 hr tablet 90 mg  90 mg Oral BID [name], MD   90 mg at 03/30/19 2152  .  prenatal multivitamin tablet 1 tablet  1 tablet Oral Q1200 Marcille Buffy D, CNM   1 tablet at 03/29/19 1404  . sodium chloride flush (NS) 0.9 % injection 3 mL  3 mL Intravenous Q12H Chancy Milroy, MD   3 mL at 03/30/19 2316  . zolpidem (AMBIEN) tablet 5 mg  5 mg Oral QHS PRN Tresea Mall, CNM        Labs:  Recent Labs  Lab 03/25/19 [phone] 03/28/19 0646 03/29/19 0537  WBC 9.2 9.8 9.8  HGB 12.1 11.8* 12.2  HCT 35.9* 36.2 36.9  PLT 269 259 239    Recent Labs  Lab 03/28/19 0646 03/29/19 0537 03/30/19 0614  NA 134* 136 136  K 3.9 3.8 3.7  CL 102 103 101  CO2 22 22 23   BUN 29* 23* 24*  CREATININE 1.53* 1.23* 1.28*  CALCIUM 9.4 9.5 9.4  PROT 6.3* 6.5 6.1*  BILITOT 0.1* 0.4 0.3  ALKPHOS 87 85 75  ALT 36 36 32  AST 21 19 19   GLUCOSE 131* 118* 106*   CBG (last 3)  Recent Labs    03/30/19 1707 03/30/19 2152 03/31/19 0611  GLUCAP 146* 119* 99    Radiology:  6/5:  cephalic, BPP 8/8 8/03: cephalic, BPP 8/8 2/12: breech, efw 39%, 1305gm, ac 31%, afi 12.5, bpp 8/8 5/26: breech, AFI 12.5, bpp 8/8 5/7: efw 36%, 852gm, AC 43%, nl AFI and S/D dopplers  Assessment & Plan:  CHTN with superimposed severe preeclampsia and type 2 DM - Stable.  Delivery indicated for persistent severe BP, recurrent FHR decels or worsening FHR tracing or other emergent indication.  Recheck labs on 04/01/19. FHR tracing is still stable for now, good fetal growth, BPP 8/8 on 03/25/19. BPP ordered for tomorrow, will follow up MFM report. - Continue current BP regimen  - CBGs stable. Will continue diet contol - s/p rescue betamethasone 5/28 and 5/29.  - Patient with previous c-section and desires RCS with BTL - DVT prophylaxis with lovenox, oob ad lib - Dispo: inpatient until delivery.   Verita Schneiders, MD, [place] for [employer], [place]

## 2019-04-01 ENCOUNTER — Inpatient Hospital Stay (HOSPITAL_COMMUNITY): Payer: Medicaid Other

## 2019-04-01 DIAGNOSIS — O24410 Gestational diabetes mellitus in pregnancy, diet controlled: Secondary | ICD-10-CM

## 2019-04-01 DIAGNOSIS — J45909 Unspecified asthma, uncomplicated: Secondary | ICD-10-CM

## 2019-04-01 DIAGNOSIS — Z3A31 31 weeks gestation of pregnancy: Secondary | ICD-10-CM

## 2019-04-01 DIAGNOSIS — O34219 Maternal care for unspecified type scar from previous cesarean delivery: Secondary | ICD-10-CM

## 2019-04-01 DIAGNOSIS — O99213 Obesity complicating pregnancy, third trimester: Secondary | ICD-10-CM

## 2019-04-01 DIAGNOSIS — O3413 Maternal care for benign tumor of corpus uteri, third trimester: Secondary | ICD-10-CM

## 2019-04-01 DIAGNOSIS — O10913 Unspecified pre-existing hypertension complicating pregnancy, third trimester: Secondary | ICD-10-CM

## 2019-04-01 DIAGNOSIS — O113 Pre-existing hypertension with pre-eclampsia, third trimester: Secondary | ICD-10-CM

## 2019-04-01 DIAGNOSIS — O9989 Other specified diseases and conditions complicating pregnancy, childbirth and the puerperium: Secondary | ICD-10-CM

## 2019-04-01 DIAGNOSIS — O1413 Severe pre-eclampsia, third trimester: Secondary | ICD-10-CM

## 2019-04-01 DIAGNOSIS — D259 Leiomyoma of uterus, unspecified: Secondary | ICD-10-CM

## 2019-04-01 LAB — CBC
HCT: 36 % (ref 36.0–46.0)
Hemoglobin: 12 g/dL (ref 12.0–15.0)
MCH: 28.5 pg (ref 26.0–34.0)
MCHC: 33.3 g/dL (ref 30.0–36.0)
MCV: 85.5 fL (ref 80.0–100.0)
Platelets: 229 10*3/uL (ref 150–400)
RBC: 4.21 MIL/uL (ref 3.87–5.11)
RDW: 14 % (ref 11.5–15.5)
WBC: 9.3 10*3/uL (ref 4.0–10.5)
nRBC: 0 % (ref 0.0–0.2)

## 2019-04-01 LAB — COMPREHENSIVE METABOLIC PANEL
ALT: 34 U/L (ref 0–44)
AST: 21 U/L (ref 15–41)
Albumin: 2.6 g/dL — ABNORMAL LOW (ref 3.5–5.0)
Alkaline Phosphatase: 74 U/L (ref 38–126)
Anion gap: 11 (ref 5–15)
BUN: 28 mg/dL — ABNORMAL HIGH (ref 6–20)
CO2: 22 mmol/L (ref 22–32)
Calcium: 9.4 mg/dL (ref 8.9–10.3)
Chloride: 100 mmol/L (ref 98–111)
Creatinine, Ser: 1.46 mg/dL — ABNORMAL HIGH (ref 0.44–1.00)
GFR calc Af Amer: 54 mL/min — ABNORMAL LOW (ref >60)
GFR calc non Af Amer: 46 mL/min — ABNORMAL LOW (ref >60)
Glucose, Bld: 102 mg/dL — ABNORMAL HIGH (ref 70–99)
Potassium: 3.8 mmol/L (ref 3.5–5.1)
Sodium: 133 mmol/L — ABNORMAL LOW (ref 135–145)
Total Bilirubin: 0.3 mg/dL (ref 0.3–1.2)
Total Protein: 6.6 g/dL (ref 6.5–8.1)

## 2019-04-01 LAB — GLUCOSE, CAPILLARY
Glucose-Capillary: 103 mg/dL — ABNORMAL HIGH (ref 70–99)
Glucose-Capillary: 113 mg/dL — ABNORMAL HIGH (ref 70–99)
Glucose-Capillary: 128 mg/dL — ABNORMAL HIGH (ref 70–99)
Glucose-Capillary: 116 mg/dL — ABNORMAL HIGH (ref 70–99)

## 2019-04-01 MED ORDER — ENOXAPARIN SODIUM 80 MG/0.8ML ~~LOC~~ SOLN
80.0000 mg | Freq: Once | SUBCUTANEOUS | Status: AC
  Administered 2019-04-02: 07:00:00 80 mg via SUBCUTANEOUS
  Filled 2019-04-01: qty 0.8

## 2019-04-01 NOTE — Progress Notes (Signed)
Pt to ultrasound

## 2019-04-01 NOTE — Progress Notes (Signed)
ANTICOAGULATION CONSULT NOTE -   Pharmacy Consult for lovenox Indication: VTE prophylaxis  No Known Allergies  Patient Measurements: Height: 5' (152.4 cm) Weight: (!) 354 lb 11.5 oz (160.9 kg) IBW/kg (Calculated) : 45.5kg BMI = 69  Vital Signs: Temp: 98.3 F (36.8 C) (06/12 0723) Temp Source: Oral (06/12 0723) BP: 158/99 (06/12 0839) Pulse Rate: 80 (06/12 0839)  Labs: Recent Labs    03/30/19 0614 04/01/19 0517  HGB  --  12.0  HCT  --  36.0  PLT  --  229  CREATININE 1.28* 1.46*    Estimated Creatinine Clearance: 78.6 mL/min (A) (by C-G formula based on SCr of 1.46 mg/dL (H)).   Medical History: Past Medical History:  Diagnosis Date  . Asthma   . Gestational diabetes 2020  . Hypertension     Medications:  Scheduled:  . cloNIDine  0.3 mg Oral TID  . docusate sodium  100 mg Oral Daily  . enoxaparin (LOVENOX) injection  0.5 mg/kg Subcutaneous Q24H  . hydrALAZINE  100 mg Oral Q6H  . hydrochlorothiazide  50 mg Oral Daily  . labetalol  800 mg Oral Q8H  . NIFEdipine  90 mg Oral BID  . prenatal multivitamin  1 tablet Oral Q1200  . sodium chloride flush  3 mL Intravenous Q12H    Assessment: Pt is 30w gestation admitted on 02/23/19 for severe preeclampsia.  Pt has been on lovenox 0.5 mg/kg SQ Q24hr for VTE prophylaxis 5/13>>5/30.  Resumed 5/31 per pharmacy. BMI >30, weight 160.9 kg.  Hgb 12 (6/12) Plts 229 (6/12) SCr is trending up again to 1.46 (normalized CrCl ~60-65 mL/min)  Plan:  Continue lovenox 0.5 mg/kg (80 mg) SQ Q24hr Monitor renal changes and for delivery   Thank you for allowing [place] to participate in this patients care.   Jens Som, PharmD Please utilize [facility] (under [place]) for appropriate number for your unit pharmacist. 04/01/2019 8:41 AM

## 2019-04-01 NOTE — Progress Notes (Signed)
Per the [facility] center she does not need to be retested for Covid 19 again.  Unless she has developed COVID symptoms you do not need to retest since she has been inpatient this whole time.     Thank you.     [facility]  Website  [email or web address]  Phone: [phone]

## 2019-04-01 NOTE — Consult Note (Signed)
Prenatal Consult       04/01/2019  10:18 AM   I was asked by Dr. Harolyn Rutherford to consult on this patient for possible preterm delivery.  I had the pleasure of meeting with Danielle Kent today.  She is a 33 y/o G2P1001 at 43 and 5/[redacted] weeks gestation.  She was admitted on 5/6 for chronic and gestational hypertension.  Her pregnancy is also complicated by obesity, asthma, and diabetes.  She was seen by the neonatology team (Dr. Tamala Julian) shortly after admission, at which time the fetus was 27 weeks.  The fetus is a female and Danielle Kent received betamethasone on 5/6 and 5/7, and again on 5/28 and 5/29.  The OB team plans to deliver the infant on 6/14, when she is [redacted] weeks gestation.  Now that a delivery date has been set, she had some additional questions about what to expect.  I explained that the neonatal intensive care team would be present for the delivery and outlined the likely delivery room course for this baby including routine resuscitation and NRP-guided approaches to the treatment of respiratory distress. We discussed other common problems associated with prematurity including respiratory distress syndrome/CLD, apnea, feeding issues, temperature regulation, and infection risk.  We briefly discussed IVH/PVL, ROP, and NEC and that these are complications associated with prematurity, but that by 30 weeks are uncommon.    We discussed the average length of stay but I noted that the actual LOS would depend on the severity of problems encountered and response to treatments.  We discussed visitation policies and the resources available while her child is in the hospital.  We discussed the importance of good nutrition and various methods of providing nutrition (parenteral hyperalimentation, gavage feedings and/or oral feeding). We discussed the benefits of human milk. I encouraged breast feeding and pumping soon after birth and outlined resources that are available to support breast feeding.  She does plan on providing breast  milk.    Thank you for involving [place] in the care of this patient. A member of our team will be available should the family have additional questions.  Time for consultation approximately 45 minutes.   _____________________ Electronically Signed By: Clinton Gallant, MD Neonatologist

## 2019-04-01 NOTE — Progress Notes (Signed)
Pt verbalized she wanted to speak with neonatologist. Notified Dr. Percell Miller. Was told they would follow up with pt either this afternoon or tonight.

## 2019-04-01 NOTE — Progress Notes (Signed)
Daily Antepartum Note  Admission Date: 02/23/2019 Current Date: 04/01/2019 11:05 AM  Danielle Kent is a 33 y.o. G2P1001 at [redacted]w[redacted]d, HD#37, admitted for severe pre-eclampsia (BPs) superimposed on chronic HTN.  Pregnancy complicated by: Patient Active Problem List   Diagnosis Date Noted  . Malpresentation before onset of labor 03/16/2019  . BMI 70 and over, adult ([place]) 03/15/2019  . Chronic renal insufficiency 03/15/2019  . Severe hypertension   . Chronic hypertension with superimposed severe preeclampsia 02/23/2019  . Preexisting diabetes complicating pregnancy, antepartum 12/27/2018  . Previous cesarean section 12/06/2018  . Chronic hypertension during pregnancy, antepartum 12/06/2018  . Maternal morbid obesity, antepartum ([place]) 12/06/2018  . Supervision of high risk pregnancy, antepartum 11/25/2018    Subjective:  Patient reports feeling well. Patient denies any headaches, visual symptoms, RUQ/epigastric pain or other concerning symptoms.  No contractions, vaginal bleeding, LOF or decreased FM.   Objective:    Current Vital Signs 24h Vital Sign Ranges  T 98.3 F (36.8 C) Temp  Avg: 98.1 F (36.7 C)  Min: 97.9 F (36.6 C)  Max: 98.3 F (36.8 C)  BP (!) 158/99 BP  Min: 128/70  Max: 158/99  HR 80 Pulse  Avg: 83.7  Min: 74  Max: 91  RR 18 Resp  Avg: 18  Min: 18  Max: 18  SaO2 99 % Room Air SpO2  Avg: 98 %  Min: 94 %  Max: 100 %       24 Hour I/O Current Shift I/O  Time Ins Outs 06/11 0701 - 06/12 0700 In: 1440 [P.O.:1440] Out: 2600 [Urine:2600] 06/12 0701 - 06/12 1900 In: 240 [P.O.:240] Out: 700 [Urine:700]   Patient Vitals for the past 24 hrs:  BP Temp Temp src Pulse Resp SpO2  04/01/19 0839 (!) 158/99 - - 80 - -  04/01/19 0723 128/70 98.3 F (36.8 C) Oral 90 18 99 %  04/01/19 0630 - - - - - 99 %  04/01/19 0628 137/83 98.2 F (36.8 C) Oral 81 18 -  04/01/19 0625 - - - - - 99 %  04/01/19 0620 - - - - - 99 %  04/01/19 0615 - - - - - 99 %  04/01/19 0610 - - - - - 99 %   04/01/19 0605 - - - - - 99 %  04/01/19 0600 - - - - - 99 %  04/01/19 0555 - - - - - 99 %  04/01/19 0550 - - - - - 100 %  04/01/19 0545 - - - - - 99 %  04/01/19 0540 - - - - - 100 %  04/01/19 0520 - - - - - 98 %  04/01/19 0510 - - - - - 97 %  04/01/19 0505 - - - - - 97 %  04/01/19 0500 - - - - - 97 %  04/01/19 0455 - - - - - 97 %  04/01/19 0450 - - - - - 97 %  04/01/19 0445 - - - - - 98 %  04/01/19 0440 - - - - - 98 %  04/01/19 0435 - - - - - 99 %  04/01/19 0430 - - - - - 97 %  04/01/19 0425 - - - - - 97 %  04/01/19 0420 - - - - - 96 %  04/01/19 0415 - - - - - 94 %  04/01/19 0410 - - - - - 97 %  04/01/19 0405 - - - - - 97 %  04/01/19 0400 - - - - -  98 %  04/01/19 0355 - - - - - 97 %  04/01/19 0350 - - - - - 97 %  04/01/19 0345 - - - - - 98 %  04/01/19 0340 - - - - - 97 %  04/01/19 0335 - - - - - 97 %  04/01/19 0330 - - - - - 98 %  04/01/19 0325 - - - - - 97 %  04/01/19 0320 - - - - - 97 %  04/01/19 0315 - - - - - 97 %  04/01/19 0310 - - - - - 98 %  04/01/19 0305 - - - - - 98 %  04/01/19 0300 - - - - - 98 %  04/01/19 0255 - - - - - 98 %  04/01/19 0250 - - - - - 97 %  04/01/19 0245 - - - - - 99 %  04/01/19 0240 - - - - - 98 %  04/01/19 0235 - - - - - 98 %  04/01/19 0230 - - - - - 98 %  04/01/19 0225 - - - - - 97 %  04/01/19 0220 - - - - - 99 %  04/01/19 0003 (!) 151/83 - - 91 18 99 %  03/31/19 2010 (!) 143/85 98 F (36.7 C) Oral 88 18 -  03/31/19 1558 130/76 97.9 F (36.6 C) Oral 82 18 100 %  03/31/19 1249 (!) 148/91 98 F (36.7 C) Oral 74 18 100 %   Body mass index is 69.28 kg/m.  FHR: 130 baseline, no accel, a couple of variable decels noted, min variablity (stable for patient)   BPP today 8/8 [place]: no contractions   Physical exam: General: Well nourished, well developed female in no acute distress. Abdomen: obese, nttp, nd Cardiovascular: Regular rate Respiratory: Normal breath sounds, no problems with breathing Extremities: no clubbing, cyanosis or  edema Skin: Warm and dry.   Medications: Current Facility-Administered Medications  Medication Dose Route Frequency Provider Last Rate Last Dose  . acetaminophen (TYLENOL) tablet 650 mg  650 mg Oral Q4H PRN Tresea Mall, CNM   650 mg at 03/24/19 1641  . calcium carbonate (TUMS - dosed in mg elemental calcium) chewable tablet 400 mg of elemental calcium  2 tablet Oral Q4H PRN Tresea Mall, CNM      . cloNIDine (CATAPRES) tablet 0.3 mg  0.3 mg Oral TID Sloan Leiter, MD   0.3 mg at 04/01/19 1610  . docusate sodium (COLACE) capsule 100 mg  100 mg Oral Daily Tresea Mall, CNM   100 mg at 03/25/19 0957  . [START ON 04/02/2019] enoxaparin (LOVENOX) injection 80 mg  80 mg Subcutaneous Once Chane Magner A, MD      . hydrALAZINE (APRESOLINE) injection 5 mg  5 mg Intravenous PRN Marcille Buffy D, CNM   5 mg at 03/21/19 0036   And  . hydrALAZINE (APRESOLINE) injection 10 mg  10 mg Intravenous PRN Marcille Buffy D, CNM   10 mg at 03/21/19 0051   And  . labetalol (NORMODYNE) injection 20 mg  20 mg Intravenous PRN Marcille Buffy D, CNM   20 mg at 03/21/19 0108   And  . labetalol (NORMODYNE) injection 40 mg  40 mg Intravenous PRN Marcille Buffy D, CNM   40 mg at 03/02/19 2349  . hydrALAZINE (APRESOLINE) tablet 100 mg  100 mg Oral Q6H Barrett, Rhonda G, PA-C   100 mg at 04/01/19 0841  . hydrochlorothiazide (HYDRODIURIL) tablet 50 mg  50 mg Oral Daily Sloan Leiter, MD   50 mg at 04/01/19 1008  . labetalol (NORMODYNE) tablet 800 mg  800 mg Oral Q8H Sloan Leiter, MD   800 mg at 04/01/19 0841  . NIFEdipine (PROCARDIA-XL/NIFEDICAL-XL) 24 hr tablet 90 mg  90 mg Oral BID [name], MD   90 mg at 04/01/19 1008  . prenatal multivitamin tablet 1 tablet  1 tablet Oral Q1200 Marcille Buffy D, CNM   1 tablet at 03/31/19 1000  . sodium chloride flush (NS) 0.9 % injection 3 mL  3 mL Intravenous Q12H Chancy Milroy, MD   3 mL at 04/01/19 1010  . zolpidem (AMBIEN) tablet 5 mg  5 mg Oral QHS PRN  Tresea Mall, CNM        Labs:  Recent Labs  Lab 03/28/19 [phone] 03/29/19 0537 04/01/19 0517  WBC 9.8 9.8 9.3  HGB 11.8* 12.2 12.0  HCT 36.2 36.9 36.0  PLT 259 239 229    Recent Labs  Lab 03/29/19 0537 03/30/19 0614 04/01/19 0517  NA 136 136 133*  K 3.8 3.7 3.8  CL 103 101 100  CO2 22 23 22   BUN 23* 24* 28*  CREATININE 1.23* 1.28* 1.46*  CALCIUM 9.5 9.4 9.4  PROT 6.5 6.1* 6.6  BILITOT 0.4 0.3 0.3  ALKPHOS 85 75 74  ALT 36 32 34  AST 19 19 21   GLUCOSE 118* 106* 102*   CBG (last 3)  Recent Labs    03/31/19 1631 03/31/19 2151 04/01/19 0627  GLUCAP 118* 117* 103*    Radiology:  0/35: cephalic, BPP 8/8, nml AFV 6/5:  cephalic, BPP 8/8 0/09: cephalic, BPP 8/8 3/81: breech, efw 39%, 1305gm, ac 31%, afi 12.5, bpp 8/8 5/26: breech, AFI 12.5, bpp 8/8 5/7: efw 36%, 852gm, AC 43%, nl AFI and S/D dopplers  Assessment & Plan:  CHTN with superimposed severe preeclampsia and type 2 DM As per Dr. Tama High (MFM); and this was copied from his notes from BPP done earlier today: Impression  Ms. Dancy has a diagnosis of chronic hypertension with  possible superimposed preeclampsia. She is on 5  antihypertensives and it took several days for control.  Recently, the creatinine increased to 1.5 before returning  close to baseline (1.28). Today, the creatinine is increased  again to 1.46. Labs including liver enzymes and plateletsare  within normal limits.  BP ranges 128-151/67-91 mm Hg.  On today's ultrasound, amniotic fluid is normal and good fetal activity is seen. Antenatal testing is reassuring. BPP 8/8. Cephalic presentation.  Patient has severe hypertension that requires 5  antihypertensives to control. Over the last week, her blood  pressures have been normal. Maternal complications are  increased and can be unpredictable. Patient already has  renal dysfunction.  Although prematurity complications are higher at 32 weeks  (as opposed to 34 weeks when delivery  would be  recommended because of severe features), it is reasonable  to consider delivery at 32 weeks to prevent serious maternal  complications.  I discussed with Ms. Crutchley (phone) and she agreed to be  delivered at 32 weeks. Patient had received rescue course of  steroids.  She will be counseled by Dr. Harolyn Rutherford. -------------------------------------------------------------Recommendations  -Delivery at 32 weeks (Monday).  -Hold lovenox for 24 hours before delivery. -------------------------------------------------------------                  Tama High, MD  - Patient counseled about delivery, wants to undergo RCS and BTS.  Surgery scheduled for [redacted]w[redacted]d on Sunday 04/03/19 at 0900 with Dr. Elonda Husky (he was notified and agrees with plan).  OB Anesthesiologist (Dr. Daiva Huge) and Neonatologist (Dr. Clifton James) notified.  Some preoperative orders placed, including the last dose of Lovenox being given at 0700 tomorrow, NPO after midnight. She is type and crossmatched for 2 units.  Dr. Elonda Husky will place other preoperative orders, and will sign consent with patient. Will recheck labs tomorrow. Patient is still aware that delivery can occur at any time; delivery indicated for persistent severe BP, recurrent FHR decels or worsening FHR tracing; worsening labs or other emergent indication.  - Continue current BP regimen  - CBGs stable. Will continue diet contol - s/p rescue betamethasone 5/28 and 5/29.  - DVT prophylaxis with lovenox, oob ad lib - Dispo: inpatient until postpartum    Verita Schneiders, MD, [place], [facility] for [employer], [place]

## 2019-04-01 NOTE — Progress Notes (Signed)
I spent time with [facility] as she prepares for delivering her baby on Sunday.  She shared her concerns as well as her excitement about meeting her baby. She is expecting to be able to see her 34 year old, Maya, sometime this weekend before the delivery and she is very happy about that.  She has very good support from her mother, sister, FOB, friends and other extended family members. She plans to stay with her mom for a bit after delivery until she is feeling well enough to be on her own.  I offered prayer for a safe delivery of her baby and for her own health as well.  We will follow up with her in the NICU next week, but please page if needs arise sooner.  [place], [place] Pager, [phone] 3:54 PM

## 2019-04-02 LAB — CBC
HCT: 38.3 % (ref 36.0–46.0)
Hemoglobin: 12.6 g/dL (ref 12.0–15.0)
MCH: 28.1 pg (ref 26.0–34.0)
MCHC: 32.9 g/dL (ref 30.0–36.0)
MCV: 85.5 fL (ref 80.0–100.0)
Platelets: 248 10*3/uL (ref 150–400)
RBC: 4.48 MIL/uL (ref 3.87–5.11)
RDW: 14.1 % (ref 11.5–15.5)
WBC: 7 10*3/uL (ref 4.0–10.5)
nRBC: 0 % (ref 0.0–0.2)

## 2019-04-02 LAB — GLUCOSE, CAPILLARY
Glucose-Capillary: 92 mg/dL (ref 70–99)
Glucose-Capillary: 131 mg/dL — ABNORMAL HIGH (ref 70–99)
Glucose-Capillary: 137 mg/dL — ABNORMAL HIGH (ref 70–99)
Glucose-Capillary: 133 mg/dL — ABNORMAL HIGH (ref 70–99)

## 2019-04-02 LAB — COMPREHENSIVE METABOLIC PANEL
ALT: 42 U/L (ref 0–44)
AST: 27 U/L (ref 15–41)
Albumin: 2.7 g/dL — ABNORMAL LOW (ref 3.5–5.0)
Alkaline Phosphatase: 84 U/L (ref 38–126)
Anion gap: 11 (ref 5–15)
BUN: 23 mg/dL — ABNORMAL HIGH (ref 6–20)
CO2: 25 mmol/L (ref 22–32)
Calcium: 9.6 mg/dL (ref 8.9–10.3)
Chloride: 98 mmol/L (ref 98–111)
Creatinine, Ser: 1.31 mg/dL — ABNORMAL HIGH (ref 0.44–1.00)
GFR calc Af Amer: >60 mL/min (ref >60)
GFR calc non Af Amer: 53 mL/min — ABNORMAL LOW (ref >60)
Glucose, Bld: 99 mg/dL (ref 70–99)
Potassium: 3.9 mmol/L (ref 3.5–5.1)
Sodium: 134 mmol/L — ABNORMAL LOW (ref 135–145)
Total Bilirubin: 0.4 mg/dL (ref 0.3–1.2)
Total Protein: 7 g/dL (ref 6.5–8.1)

## 2019-04-02 LAB — TYPE AND SCREEN
ABO/RH(D): O POS
Antibody Screen: NEGATIVE

## 2019-04-02 LAB — HIV ANTIBODY (ROUTINE TESTING W REFLEX): HIV Screen 4th Generation wRfx: NONREACTIVE

## 2019-04-02 LAB — RPR: RPR Ser Ql: NONREACTIVE

## 2019-04-02 NOTE — Progress Notes (Signed)
Patient ID: Danielle Kent, female   DOB: 1986/01/25, 33 y.o.   MRN: [id number] [place]) NOTE  Danielle Kent is a 33 y.o. G2P1001 with Estimated Date of Delivery: 05/29/19   By   [redacted]w[redacted]d  who is admitted for [facility] with severe pre eclampsia, worsening.    Fetal presentation is breech. Length of Stay:  38  Days  Date of admission:02/23/2019  Subjective: No headache or visual changes Patient reports the fetal movement as active. Patient reports uterine contraction  activity as none. Patient reports  vaginal bleeding as none. Patient describes fluid per vagina as None.  Vitals:  Blood pressure (!) 154/92, pulse 77, temperature 98.2 F (36.8 C), temperature source Oral, resp. rate 18, height 5' (1.524 m), weight (!) 160.9 kg, last menstrual period 08/31/2018, SpO2 100 %. Vitals:   04/01/19 1506 04/01/19 2010 04/01/19 2308 04/02/19 0350  BP: 133/78 (!) 148/97 (!) 147/93 (!) 154/92  Pulse: 75 80 76 77  Resp: 18 19 19 18   Temp: 98.2 F (36.8 C) 98.5 F (36.9 C) 98.3 F (36.8 C) 98.2 F (36.8 C)  TempSrc: Oral Oral Oral Oral  SpO2: 98% 98% 99% 100%  Weight:      Height:       Physical Examination:  General appearance - alert, well appearing, and in no distress Fundal Height:  Impossible to determine Pelvic Exam:  examination not indicated Cervical Exam: Not evaluated. Extremities: extremities normal, atraumatic, no cyanosis or edema with DTRs 2+ bilaterally Membranes:intact  Fetal Monitoring:  FHR 140s >6 bpm variability reassuring stable pattern for this patient     Labs:  Results for orders placed or performed during the hospital encounter of 02/23/19 (from the past 24 hour(s))  Glucose, capillary   Collection Time: 04/01/19 12:00 PM  Result Value Ref Range   Glucose-Capillary 113 (H) 70 - 99 mg/dL  Glucose, capillary   Collection Time: 04/01/19  6:14 PM  Result Value Ref Range   Glucose-Capillary 128 (H) 70 - 99 mg/dL  Glucose, capillary   Collection  Time: 04/01/19  9:43 PM  Result Value Ref Range   Glucose-Capillary 116 (H) 70 - 99 mg/dL  Glucose, capillary   Collection Time: 04/02/19  6:47 AM  Result Value Ref Range   Glucose-Capillary 92 70 - 99 mg/dL    Imaging Studies:      Medications:  Scheduled . cloNIDine  0.3 mg Oral TID  . docusate sodium  100 mg Oral Daily  . hydrALAZINE  100 mg Oral Q6H  . hydrochlorothiazide  50 mg Oral Daily  . labetalol  800 mg Oral Q8H  . NIFEdipine  90 mg Oral BID  . prenatal multivitamin  1 tablet Oral Q1200  . sodium chloride flush  3 mL Intravenous Q12H   I have reviewed the patient's current medications.  ASSESSMENT: G2P1001 [redacted]w[redacted]d Estimated Date of Delivery: 05/29/19  Patient Active Problem List   Diagnosis Date Noted  . Malpresentation before onset of labor 03/16/2019  . BMI 70 and over, adult ([place]) 03/15/2019  . Chronic renal insufficiency 03/15/2019  . Severe hypertension   . Chronic hypertension with superimposed severe preeclampsia 02/23/2019  . Preexisting diabetes complicating pregnancy, antepartum 12/27/2018  . Previous cesarean section 12/06/2018  . Chronic hypertension during pregnancy, antepartum 12/06/2018  . Maternal morbid obesity, antepartum ([place]) 12/06/2018  . Supervision of high risk pregnancy, antepartum 11/25/2018    PLAN: Repeat C section with BTL at [redacted]w[redacted]d, which is tomorrow Pre op orders done  [place]  04/02/2019,7:53 AM

## 2019-04-02 NOTE — Progress Notes (Addendum)
Pt remaines off the unit to visit with family. Pt states that she wanted to be monitored when she returns. Toya Smothers, RN

## 2019-04-03 ENCOUNTER — Encounter (HOSPITAL_COMMUNITY): Payer: Self-pay | Admitting: Anesthesiology

## 2019-04-03 ENCOUNTER — Inpatient Hospital Stay (HOSPITAL_COMMUNITY): Payer: Medicaid Other | Admitting: Anesthesiology

## 2019-04-03 ENCOUNTER — Encounter (HOSPITAL_COMMUNITY)
Admission: AD | Disposition: A | Discharge status: Home / Self Care | Payer: Self-pay | Source: Home / Self Care | Attending: Obstetrics and Gynecology

## 2019-04-03 DIAGNOSIS — O1002 Pre-existing essential hypertension complicating childbirth: Secondary | ICD-10-CM

## 2019-04-03 DIAGNOSIS — Z3A32 32 weeks gestation of pregnancy: Secondary | ICD-10-CM

## 2019-04-03 DIAGNOSIS — O114 Pre-existing hypertension with pre-eclampsia, complicating childbirth: Secondary | ICD-10-CM

## 2019-04-03 DIAGNOSIS — O321XX0 Maternal care for breech presentation, not applicable or unspecified: Secondary | ICD-10-CM

## 2019-04-03 DIAGNOSIS — Z302 Encounter for sterilization: Secondary | ICD-10-CM

## 2019-04-03 LAB — COMPREHENSIVE METABOLIC PANEL
ALT: 44 U/L (ref 0–44)
AST: 24 U/L (ref 15–41)
Albumin: 2.6 g/dL — ABNORMAL LOW (ref 3.5–5.0)
Alkaline Phosphatase: 82 U/L (ref 38–126)
Anion gap: 11 (ref 5–15)
BUN: 23 mg/dL — ABNORMAL HIGH (ref 6–20)
CO2: 23 mmol/L (ref 22–32)
Calcium: 9.4 mg/dL (ref 8.9–10.3)
Chloride: 100 mmol/L (ref 98–111)
Creatinine, Ser: 1.3 mg/dL — ABNORMAL HIGH (ref 0.44–1.00)
GFR calc Af Amer: >60 mL/min (ref >60)
GFR calc non Af Amer: 53 mL/min — ABNORMAL LOW (ref >60)
Glucose, Bld: 106 mg/dL — ABNORMAL HIGH (ref 70–99)
Potassium: 3.8 mmol/L (ref 3.5–5.1)
Sodium: 134 mmol/L — ABNORMAL LOW (ref 135–145)
Total Bilirubin: 0.3 mg/dL (ref 0.3–1.2)
Total Protein: 6.7 g/dL (ref 6.5–8.1)

## 2019-04-03 LAB — CBC
HCT: 36.1 % (ref 36.0–46.0)
Hemoglobin: 12 g/dL (ref 12.0–15.0)
MCH: 28.4 pg (ref 26.0–34.0)
MCHC: 33.2 g/dL (ref 30.0–36.0)
MCV: 85.5 fL (ref 80.0–100.0)
Platelets: 242 10*3/uL (ref 150–400)
RBC: 4.22 MIL/uL (ref 3.87–5.11)
RDW: 14 % (ref 11.5–15.5)
WBC: 9.8 10*3/uL (ref 4.0–10.5)
nRBC: 0 % (ref 0.0–0.2)

## 2019-04-03 LAB — TYPE AND SCREEN
ABO/RH(D): O POS
Antibody Screen: NEGATIVE
Unit division: 0
Unit division: 0

## 2019-04-03 LAB — GLUCOSE, CAPILLARY
Glucose-Capillary: 97 mg/dL (ref 70–99)
Glucose-Capillary: 113 mg/dL — ABNORMAL HIGH (ref 70–99)

## 2019-04-03 LAB — BPAM RBC
Blood Product Expiration Date: 202007142359
Blood Product Expiration Date: 202007142359
Unit Type and Rh: 5100
Unit Type and Rh: 5100

## 2019-04-03 LAB — MRSA PCR SCREENING: MRSA by PCR: NEGATIVE

## 2019-04-03 SURGERY — Surgical Case
Anesthesia: Spinal | Wound class: Clean Contaminated

## 2019-04-03 MED ORDER — BUPIVACAINE IN DEXTROSE 0.75-8.25 % IT SOLN
INTRATHECAL | Status: DC | PRN
  Administered 2019-04-03: 1.2 mL via INTRATHECAL

## 2019-04-03 MED ORDER — SIMETHICONE 80 MG PO CHEW
80.0000 mg | CHEWABLE_TABLET | ORAL | Status: DC
  Administered 2019-04-04: 01:00:00 80 mg via ORAL
  Filled 2019-04-03: qty 1

## 2019-04-03 MED ORDER — SODIUM BICARBONATE 8.4 % IV SOLN
INTRAVENOUS | Status: DC | PRN
  Administered 2019-04-03: 2 mL via EPIDURAL
  Administered 2019-04-03: 5 mL via EPIDURAL

## 2019-04-03 MED ORDER — PRENATAL MULTIVITAMIN CH
1.0000 | ORAL_TABLET | Freq: Every day | ORAL | Status: DC
  Administered 2019-04-04 – 2019-04-07 (×4): 1 via ORAL
  Filled 2019-04-03 (×4): qty 1

## 2019-04-03 MED ORDER — NALBUPHINE HCL 10 MG/ML IJ SOLN: 5.0000 mg | Freq: Once | INTRAMUSCULAR | Status: DC | PRN

## 2019-04-03 MED ORDER — IBUPROFEN 800 MG PO TABS: 800.0000 mg | ORAL_TABLET | Freq: Four times a day (QID) | ORAL | Status: DC

## 2019-04-03 MED ORDER — LACTATED RINGERS IV SOLN
INTRAVENOUS | Status: DC | PRN
  Administered 2019-04-03: 08:00:00 via INTRAVENOUS

## 2019-04-03 MED ORDER — MORPHINE SULFATE (PF) 0.5 MG/ML IJ SOLN
INTRAMUSCULAR | Status: AC
  Filled 2019-04-03: qty 10

## 2019-04-03 MED ORDER — NALBUPHINE HCL 10 MG/ML IJ SOLN: 5.0000 mg | INTRAMUSCULAR | Status: DC | PRN

## 2019-04-03 MED ORDER — HYDROMORPHONE HCL 1 MG/ML IJ SOLN
INTRAMUSCULAR | Status: AC
  Filled 2019-04-03: qty 0.5

## 2019-04-03 MED ORDER — SODIUM CHLORIDE 0.9% FLUSH: 3.0000 mL | INTRAVENOUS | Status: DC | PRN

## 2019-04-03 MED ORDER — SODIUM BICARBONATE 8.4 % IV SOLN
INTRAVENOUS | Status: AC
  Filled 2019-04-03: qty 50

## 2019-04-03 MED ORDER — OXYTOCIN 10 UNIT/ML IJ SOLN
INTRAMUSCULAR | Status: DC | PRN
  Administered 2019-04-03: 40 [IU]

## 2019-04-03 MED ORDER — SOD CITRATE-CITRIC ACID 500-334 MG/5ML PO SOLN
30.0000 mL | Freq: Once | ORAL | Status: AC
  Administered 2019-04-03: 30 mL via ORAL

## 2019-04-03 MED ORDER — SIMETHICONE 80 MG PO CHEW
80.0000 mg | CHEWABLE_TABLET | Freq: Three times a day (TID) | ORAL | Status: DC
  Administered 2019-04-03 – 2019-04-07 (×11): 80 mg via ORAL
  Filled 2019-04-03 (×12): qty 1

## 2019-04-03 MED ORDER — MEPERIDINE HCL 25 MG/ML IJ SOLN: 6.2500 mg | INTRAMUSCULAR | Status: DC | PRN

## 2019-04-03 MED ORDER — LACTATED RINGERS IV SOLN: INTRAVENOUS | Status: DC

## 2019-04-03 MED ORDER — DEXTROSE 5 % IV SOLN
INTRAVENOUS | Status: DC | PRN
  Administered 2019-04-03: 3 g via INTRAVENOUS

## 2019-04-03 MED ORDER — ENOXAPARIN SODIUM 80 MG/0.8ML ~~LOC~~ SOLN
80.0000 mg | SUBCUTANEOUS | Status: DC
  Administered 2019-04-04 – 2019-04-07 (×4): 80 mg via SUBCUTANEOUS
  Filled 2019-04-03 (×4): qty 0.8

## 2019-04-03 MED ORDER — ONDANSETRON HCL 4 MG/2ML IJ SOLN
INTRAMUSCULAR | Status: AC
  Filled 2019-04-03: qty 2

## 2019-04-03 MED ORDER — MENTHOL 3 MG MT LOZG: 1.0000 | LOZENGE | OROMUCOSAL | Status: DC | PRN

## 2019-04-03 MED ORDER — PHENYLEPHRINE 40 MCG/ML (10ML) SYRINGE FOR IV PUSH (FOR BLOOD PRESSURE SUPPORT)
PREFILLED_SYRINGE | INTRAVENOUS | Status: AC
  Filled 2019-04-03: qty 10

## 2019-04-03 MED ORDER — SCOPOLAMINE 1 MG/3DAYS TD PT72
MEDICATED_PATCH | TRANSDERMAL | Status: AC
  Filled 2019-04-03: qty 1

## 2019-04-03 MED ORDER — OXYTOCIN 40 UNITS IN NORMAL SALINE INFUSION - SIMPLE MED
INTRAVENOUS | Status: AC
  Filled 2019-04-03: qty 1000

## 2019-04-03 MED ORDER — SCOPOLAMINE 1 MG/3DAYS TD PT72: 1.0000 | MEDICATED_PATCH | Freq: Once | TRANSDERMAL | Status: DC

## 2019-04-03 MED ORDER — DIBUCAINE (PERIANAL) 1 % EX OINT: 1.0000 "application " | TOPICAL_OINTMENT | CUTANEOUS | Status: DC | PRN

## 2019-04-03 MED ORDER — SENNOSIDES-DOCUSATE SODIUM 8.6-50 MG PO TABS
2.0000 | ORAL_TABLET | ORAL | Status: DC
  Administered 2019-04-04 – 2019-04-07 (×4): 2 via ORAL
  Filled 2019-04-03 (×4): qty 2

## 2019-04-03 MED ORDER — ONDANSETRON HCL 4 MG/2ML IJ SOLN
INTRAMUSCULAR | Status: DC | PRN
  Administered 2019-04-03: 4 mg via INTRAVENOUS

## 2019-04-03 MED ORDER — SIMETHICONE 80 MG PO CHEW: 80.0000 mg | CHEWABLE_TABLET | ORAL | Status: DC | PRN

## 2019-04-03 MED ORDER — FENTANYL CITRATE (PF) 100 MCG/2ML IJ SOLN
INTRAMUSCULAR | Status: AC
  Filled 2019-04-03: qty 2

## 2019-04-03 MED ORDER — OXYTOCIN 40 UNITS IN NORMAL SALINE INFUSION - SIMPLE MED: 2.5000 [IU]/h | INTRAVENOUS | Status: AC

## 2019-04-03 MED ORDER — ONDANSETRON HCL 4 MG/2ML IJ SOLN: 4.0000 mg | Freq: Three times a day (TID) | INTRAMUSCULAR | Status: DC | PRN

## 2019-04-03 MED ORDER — DIPHENHYDRAMINE HCL 25 MG PO CAPS: 25.0000 mg | ORAL_CAPSULE | ORAL | Status: DC | PRN

## 2019-04-03 MED ORDER — SODIUM CHLORIDE 0.9 % IV SOLN
INTRAVENOUS | Status: DC | PRN
  Administered 2019-04-03: 09:00:00 via INTRAVENOUS

## 2019-04-03 MED ORDER — COCONUT OIL OIL: 1.0000 "application " | TOPICAL_OIL | Status: DC | PRN

## 2019-04-03 MED ORDER — KETOROLAC TROMETHAMINE 30 MG/ML IJ SOLN
INTRAMUSCULAR | Status: AC
  Filled 2019-04-03: qty 1

## 2019-04-03 MED ORDER — ACETAMINOPHEN 10 MG/ML IV SOLN: 1000.0000 mg | Freq: Once | INTRAVENOUS | Status: DC | PRN

## 2019-04-03 MED ORDER — WITCH HAZEL-GLYCERIN EX PADS: 1.0000 "application " | MEDICATED_PAD | CUTANEOUS | Status: DC | PRN

## 2019-04-03 MED ORDER — ENOXAPARIN SODIUM 40 MG/0.4ML ~~LOC~~ SOLN: 40.0000 mg | SUBCUTANEOUS | Status: DC

## 2019-04-03 MED ORDER — HYDRALAZINE HCL 20 MG/ML IJ SOLN: 10.0000 mg | INTRAMUSCULAR | Status: DC | PRN

## 2019-04-03 MED ORDER — OXYCODONE HCL 5 MG PO TABS
5.0000 mg | ORAL_TABLET | ORAL | Status: DC | PRN
  Administered 2019-04-04 – 2019-04-07 (×13): 10 mg via ORAL
  Filled 2019-04-03 (×13): qty 2

## 2019-04-03 MED ORDER — SODIUM CHLORIDE 0.9 % IV SOLN
INTRAVENOUS | Status: DC | PRN
  Administered 2019-04-03: 09:00:00 60 ug/min via INTRAVENOUS

## 2019-04-03 MED ORDER — CLONIDINE HCL 0.3 MG/24HR TD PTWK
0.3000 mg | MEDICATED_PATCH | TRANSDERMAL | Status: DC
  Administered 2019-04-04: 0.3 mg via TRANSDERMAL
  Filled 2019-04-03: qty 1

## 2019-04-03 MED ORDER — KETOROLAC TROMETHAMINE 30 MG/ML IJ SOLN
30.0000 mg | Freq: Once | INTRAMUSCULAR | Status: AC
  Administered 2019-04-03: 12:00:00 30 mg via INTRAVENOUS

## 2019-04-03 MED ORDER — DIPHENHYDRAMINE HCL 50 MG/ML IJ SOLN: 12.5000 mg | INTRAMUSCULAR | Status: DC | PRN

## 2019-04-03 MED ORDER — KETOROLAC TROMETHAMINE 30 MG/ML IJ SOLN
30.0000 mg | Freq: Four times a day (QID) | INTRAMUSCULAR | Status: AC
  Administered 2019-04-03 – 2019-04-04 (×3): 30 mg via INTRAVENOUS
  Filled 2019-04-03 (×3): qty 1

## 2019-04-03 MED ORDER — ZOLPIDEM TARTRATE 5 MG PO TABS: 5.0000 mg | ORAL_TABLET | Freq: Every evening | ORAL | Status: DC | PRN

## 2019-04-03 MED ORDER — SCOPOLAMINE 1 MG/3DAYS TD PT72
MEDICATED_PATCH | TRANSDERMAL | Status: DC | PRN
  Administered 2019-04-03: 1 via TRANSDERMAL

## 2019-04-03 MED ORDER — SODIUM CHLORIDE 0.9 % IR SOLN
Status: DC | PRN
  Administered 2019-04-03: 1

## 2019-04-03 MED ORDER — NALOXONE HCL 4 MG/10ML IJ SOLN
1.0000 ug/kg/h | INTRAVENOUS | Status: DC | PRN
  Filled 2019-04-03: qty 5

## 2019-04-03 MED ORDER — PROMETHAZINE HCL 25 MG/ML IJ SOLN: 6.2500 mg | INTRAMUSCULAR | Status: DC | PRN

## 2019-04-03 MED ORDER — PHENYLEPHRINE HCL-NACL 20-0.9 MG/250ML-% IV SOLN
INTRAVENOUS | Status: AC
  Filled 2019-04-03: qty 250

## 2019-04-03 MED ORDER — GABAPENTIN 100 MG PO CAPS
200.0000 mg | ORAL_CAPSULE | Freq: Three times a day (TID) | ORAL | Status: DC
  Administered 2019-04-03 – 2019-04-07 (×13): 200 mg via ORAL
  Filled 2019-04-03 (×13): qty 2

## 2019-04-03 MED ORDER — METOCLOPRAMIDE HCL 5 MG/ML IJ SOLN
INTRAMUSCULAR | Status: AC
  Filled 2019-04-03: qty 2

## 2019-04-03 MED ORDER — NALOXONE HCL 0.4 MG/ML IJ SOLN: 0.4000 mg | INTRAMUSCULAR | Status: DC | PRN

## 2019-04-03 MED ORDER — MORPHINE SULFATE (PF) 0.5 MG/ML IJ SOLN
INTRAMUSCULAR | Status: DC | PRN
  Administered 2019-04-03: 150 ug via INTRATHECAL

## 2019-04-03 MED ORDER — ACETAMINOPHEN 500 MG PO TABS
1000.0000 mg | ORAL_TABLET | Freq: Four times a day (QID) | ORAL | Status: AC
  Administered 2019-04-03 – 2019-04-04 (×3): 1000 mg via ORAL
  Filled 2019-04-03 (×3): qty 2

## 2019-04-03 MED ORDER — SOD CITRATE-CITRIC ACID 500-334 MG/5ML PO SOLN
ORAL | Status: AC
  Filled 2019-04-03: qty 15

## 2019-04-03 MED ORDER — HYDROMORPHONE HCL 1 MG/ML IJ SOLN
0.2500 mg | INTRAMUSCULAR | Status: DC | PRN
  Administered 2019-04-03 (×2): 0.5 mg via INTRAVENOUS

## 2019-04-03 MED ORDER — DEXAMETHASONE SODIUM PHOSPHATE 4 MG/ML IJ SOLN
INTRAMUSCULAR | Status: AC
  Filled 2019-04-03: qty 7

## 2019-04-03 MED ORDER — DEXTROSE 5 % IV SOLN
3.0000 g | INTRAVENOUS | Status: DC
  Filled 2019-04-03 (×3): qty 3000

## 2019-04-03 MED ORDER — DIPHENHYDRAMINE HCL 25 MG PO CAPS: 25.0000 mg | ORAL_CAPSULE | Freq: Four times a day (QID) | ORAL | Status: DC | PRN

## 2019-04-03 MED ORDER — FENTANYL CITRATE (PF) 100 MCG/2ML IJ SOLN
INTRAMUSCULAR | Status: DC | PRN
  Administered 2019-04-03: 15 ug via INTRATHECAL

## 2019-04-03 MED ORDER — LACTATED RINGERS IV SOLN
INTRAVENOUS | Status: DC
  Administered 2019-04-03: 08:00:00 via INTRAVENOUS

## 2019-04-03 MED ORDER — TETANUS-DIPHTH-ACELL PERTUSSIS 5-2.5-18.5 LF-MCG/0.5 IM SUSP
0.5000 mL | Freq: Once | INTRAMUSCULAR | Status: AC
  Administered 2019-04-04: 19:00:00 0.5 mL via INTRAMUSCULAR
  Filled 2019-04-03: qty 0.5

## 2019-04-03 SURGICAL SUPPLY — 40 items
CLAMP CORD UMBIL (MISCELLANEOUS) IMPLANT
CLIP FILSHIE TUBAL LIGA STRL (Clip) ×3 IMPLANT
CLOTH BEACON ORANGE TIMEOUT ST (SAFETY) ×3 IMPLANT
DERMABOND ADVANCED (GAUZE/BANDAGES/DRESSINGS) ×4
DERMABOND ADVANCED .7 DNX12 (GAUZE/BANDAGES/DRESSINGS) ×2 IMPLANT
DRSG OPSITE POSTOP 4X10 (GAUZE/BANDAGES/DRESSINGS) ×3 IMPLANT
ELECT REM PT RETURN 9FT ADLT (ELECTROSURGICAL) ×3
ELECTRODE REM PT RTRN 9FT ADLT (ELECTROSURGICAL) ×1 IMPLANT
EXTRACTOR VACUUM BELL CUP MITY (SUCTIONS) ×3 IMPLANT
EXTRACTOR VACUUM BELL STYLE (SUCTIONS) IMPLANT
GLOVE BIOGEL PI IND STRL 7.0 (GLOVE) ×1 IMPLANT
GLOVE BIOGEL PI IND STRL 8 (GLOVE) ×1 IMPLANT
GLOVE BIOGEL PI INDICATOR 7.0 (GLOVE) ×2
GLOVE BIOGEL PI INDICATOR 8 (GLOVE) ×2
GLOVE ECLIPSE 8.0 STRL XLNG CF (GLOVE) ×3 IMPLANT
GOWN STRL REUS W/TWL LRG LVL3 (GOWN DISPOSABLE) ×6 IMPLANT
KIT ABG SYR 3ML LUER SLIP (SYRINGE) ×6 IMPLANT
KIT PREVENA INCISION MGT20CM45 (CANNISTER) ×3 IMPLANT
NEEDLE HYPO 18GX1.5 BLUNT FILL (NEEDLE) ×3 IMPLANT
NEEDLE HYPO 22GX1.5 SAFETY (NEEDLE) ×3 IMPLANT
NEEDLE HYPO 25X5/8 SAFETYGLIDE (NEEDLE) ×6 IMPLANT
NS IRRIG 1000ML POUR BTL (IV SOLUTION) ×3 IMPLANT
PACK C SECTION WH (CUSTOM PROCEDURE TRAY) ×3 IMPLANT
PAD OB MATERNITY 4.3X12.25 (PERSONAL CARE ITEMS) ×3 IMPLANT
PENCIL SMOKE EVAC W/HOLSTER (ELECTROSURGICAL) ×3 IMPLANT
RTRCTR C-SECT PINK 25CM LRG (MISCELLANEOUS) IMPLANT
STAPLER VISISTAT 35W (STAPLE) ×3 IMPLANT
SUT CHROMIC 0 CT 1 (SUTURE) ×3 IMPLANT
SUT MNCRL 0 VIOLET CTX 36 (SUTURE) ×6 IMPLANT
SUT MONOCRYL 0 CTX 36 (SUTURE) ×12
SUT PLAIN 2 0 (SUTURE) ×4
SUT PLAIN 2 0 XLH (SUTURE) IMPLANT
SUT PLAIN ABS 2-0 CT1 27XMFL (SUTURE) ×2 IMPLANT
SUT VIC AB 0 CTX 36 (SUTURE) ×2
SUT VIC AB 0 CTX36XBRD ANBCTRL (SUTURE) ×1 IMPLANT
SUT VIC AB 4-0 KS 27 (SUTURE) IMPLANT
SYR 20CC LL (SYRINGE) ×6 IMPLANT
TOWEL OR 17X24 6PK STRL BLUE (TOWEL DISPOSABLE) ×3 IMPLANT
TRAY FOLEY W/BAG SLVR 14FR LF (SET/KITS/TRAYS/PACK) IMPLANT
WATER STERILE IRR 1000ML POUR (IV SOLUTION) ×3 IMPLANT

## 2019-04-03 NOTE — Anesthesia Procedure Notes (Signed)
Spinal  Patient location during procedure: OR Start time: 04/03/2019 8:35 AM End time: 04/03/2019 8:39 AM Staffing Anesthesiologist: Lyn Hollingshead, MD Performed: anesthesiologist  Preanesthetic Checklist Completed: patient identified, site marked, surgical consent, pre-op evaluation, timeout performed, IV checked, risks and benefits discussed and monitors and equipment checked Spinal Block Patient position: sitting Prep: site prepped and draped and DuraPrep Patient monitoring: cardiac monitor, continuous pulse ox, blood pressure and heart rate Approach: midline Location: L3-4 Injection technique: catheter Needle Needle type: Tuohy and Sprotte  Needle gauge: 24 G Needle length: 15 cm Needle insertion depth: 9 cm Catheter type: closed end flexible Catheter size: 19 g Catheter at skin depth: 16 cm Assessment Sensory level: T4

## 2019-04-03 NOTE — Transfer of Care (Signed)
Immediate Anesthesia Transfer of Care Note  Patient: Danielle Kent  Procedure(s) Performed: CESAREAN SECTION WITH BILATERAL TUBAL LIGATION (N/A )  Patient Location: PACU  Anesthesia Type:Spinal and Epidural  Level of Consciousness: awake, alert  and oriented  Airway & Oxygen Therapy: Patient Spontanous Breathing  Post-op Assessment: Report given to RN and Post -op Vital signs reviewed and stable  Post vital signs: Reviewed and stable  Last Vitals:  Vitals Value Taken Time  BP 173/74 04/03/19 1020  Temp    Pulse 75 04/03/19 1023  Resp 13 04/03/19 1023  SpO2 100 % 04/03/19 1023  Vitals shown include unvalidated device data.  Last Pain:  Vitals:   04/03/19 0744  TempSrc: Oral  PainSc:       Patients Stated Pain Goal: 0 (01/65/53 7482)  Complications: No apparent anesthesia complications

## 2019-04-03 NOTE — Plan of Care (Signed)
Progressing well.

## 2019-04-03 NOTE — Progress Notes (Signed)
Patient ID: Danielle Kent, female   DOB: 12-10-1985, 33 y.o.   MRN: [id number] [place]) NOTE  Danielle Kent is a 33 y.o. G2P1001 at [redacted]w[redacted]d  who is admitted for [facility] with severe pre eclampsia, worsening.    Fetal presentation is cephalic. Length of Stay:  39  Days  Date of admission:02/23/2019  Subjective: Patient reports feeling well and denies HA, visual changes, RUQ/epigastric pain Patient reports the fetal movement as active. Patient reports uterine contraction  activity as none. Patient reports  vaginal bleeding as none. Patient describes fluid per vagina as None.  Vitals:  Blood pressure (!) 147/78, pulse 81, temperature 97.6 F (36.4 C), temperature source Oral, resp. rate 18, height 5' (1.524 m), weight (!) 160.9 kg, last menstrual period 08/31/2018, SpO2 99 %. Vitals:   04/02/19 2332 04/02/19 2338 04/03/19 0559 04/03/19 0600  BP: (!) 182/96 (!) 159/90 (!) 147/78   Pulse: 95 91 81   Resp:  18 18   Temp:  97.7 F (36.5 C) 97.6 F (36.4 C)   TempSrc:  Oral Oral   SpO2:  100% 100% 99%  Weight:      Height:       Physical Examination:  General appearance - alert, well appearing, and in no distress Fundal Height:  Impossible to determine Pelvic Exam:  examination not indicated Cervical Exam: Not evaluated. Extremities: extremities normal, atraumatic, no cyanosis or edema with DTRs 2+ bilaterally Membranes:intact  Fetal Monitoring:  FHR 130s, min variability, no accels, no decels- unchanged since admission    Labs:  Results for orders placed or performed during the hospital encounter of 02/23/19 (from the past 24 hour(s))  CBC   Collection Time: 04/02/19  7:44 AM  Result Value Ref Range   WBC 7.0 4.0 - 10.5 K/uL   RBC 4.48 3.87 - 5.11 MIL/uL   Hemoglobin 12.6 12.0 - 15.0 g/dL   HCT 38.3 36.0 - 46.0 %   MCV 85.5 80.0 - 100.0 fL   MCH 28.1 26.0 - 34.0 pg   MCHC 32.9 30.0 - 36.0 g/dL   RDW 14.1 11.5 - 15.5 %   Platelets 248 150 - 400 K/uL   nRBC 0.0 0.0 - 0.2 %  Comprehensive metabolic panel   Collection Time: 04/02/19  7:44 AM  Result Value Ref Range   Sodium 134 (L) 135 - 145 mmol/L   Potassium 3.9 3.5 - 5.1 mmol/L   Chloride 98 98 - 111 mmol/L   CO2 25 22 - 32 mmol/L   Glucose, Bld 99 70 - 99 mg/dL   BUN 23 (H) 6 - 20 mg/dL   Creatinine, Ser 1.31 (H) 0.44 - 1.00 mg/dL   Calcium 9.6 8.9 - 10.3 mg/dL   Total Protein 7.0 6.5 - 8.1 g/dL   Albumin 2.7 (L) 3.5 - 5.0 g/dL   AST 27 15 - 41 U/L   ALT 42 0 - 44 U/L   Alkaline Phosphatase 84 38 - 126 U/L   Total Bilirubin 0.4 0.3 - 1.2 mg/dL   GFR calc non Af Amer 53 (L) >60 mL/min   GFR calc Af Amer >60 >60 mL/min   Anion gap 11 5 - 15  RPR   Collection Time: 04/02/19  7:44 AM  Result Value Ref Range   RPR Ser Ql Non Reactive Non Reactive  HIV Antibody (routine testing w rflx)   Collection Time: 04/02/19  7:44 AM  Result Value Ref Range   HIV Screen 4th Generation wRfx Non Reactive Non Reactive  Type and screen   Collection Time: 04/02/19  7:44 AM  Result Value Ref Range   ABO/RH(D) O POS    Antibody Screen NEG    Sample Expiration      04/05/2019,2359 Performed at [facility], [place] [address], [place], [place] [address]   Glucose, capillary   Collection Time: 04/02/19 10:54 AM  Result Value Ref Range   Glucose-Capillary 131 (H) 70 - 99 mg/dL  Glucose, capillary   Collection Time: 04/02/19  4:36 PM  Result Value Ref Range   Glucose-Capillary 137 (H) 70 - 99 mg/dL   Comment 1 Notify RN    Comment 2 Document in Chart   Glucose, capillary   Collection Time: 04/02/19  9:31 PM  Result Value Ref Range   Glucose-Capillary 133 (H) 70 - 99 mg/dL  MRSA PCR Screening   Collection Time: 04/02/19 10:30 PM   Specimen: Nasal Mucosa; Nasopharyngeal  Result Value Ref Range   MRSA by PCR NEGATIVE NEGATIVE  Glucose, capillary   Collection Time: 04/03/19  6:06 AM  Result Value Ref Range   Glucose-Capillary 97 70 - 99 mg/dL  CBC   Collection Time: 04/03/19  6:09  AM  Result Value Ref Range   WBC 9.8 4.0 - 10.5 K/uL   RBC 4.22 3.87 - 5.11 MIL/uL   Hemoglobin 12.0 12.0 - 15.0 g/dL   HCT 36.1 36.0 - 46.0 %   MCV 85.5 80.0 - 100.0 fL   MCH 28.4 26.0 - 34.0 pg   MCHC 33.2 30.0 - 36.0 g/dL   RDW 14.0 11.5 - 15.5 %   Platelets 242 150 - 400 K/uL   nRBC 0.0 0.0 - 0.2 %    Imaging Studies:      Medications:  Scheduled . cloNIDine  0.3 mg Oral TID  . docusate sodium  100 mg Oral Daily  . hydrALAZINE  100 mg Oral Q6H  . hydrochlorothiazide  50 mg Oral Daily  . labetalol  800 mg Oral Q8H  . NIFEdipine  90 mg Oral BID  . prenatal multivitamin  1 tablet Oral Q1200  . sodium chloride flush  3 mL Intravenous Q12H   I have reviewed the patient's current medications.  ASSESSMENT: G2P1001 [redacted]w[redacted]d Estimated Date of Delivery: 05/29/19  Patient Active Problem List   Diagnosis Date Noted  . Malpresentation before onset of labor 03/16/2019  . BMI 70 and over, adult ([place]) 03/15/2019  . Chronic renal insufficiency 03/15/2019  . Severe hypertension   . Chronic hypertension with superimposed severe preeclampsia 02/23/2019  . Preexisting diabetes complicating pregnancy, antepartum 12/27/2018  . Previous cesarean section 12/06/2018  . Chronic hypertension during pregnancy, antepartum 12/06/2018  . Maternal morbid obesity, antepartum ([place]) 12/06/2018  . Supervision of high risk pregnancy, antepartum 11/25/2018    PLAN: Patient completed BMZ and magnesium sulfate Repeat scheduled for repeat C section with BTL today. Risks, benefits and alternatives were explained including but not limited to risks of bleeding, infection and damage to adjacent organs. Patient also desires permanent sterilization. Risks and benefits of procedure discussed with patient including permanence of method, bleeding, infection, injury to surrounding organs and need for additional procedures. Risk failure of 0.5-1% with increased risk of ectopic gestation if pregnancy occurs was also  discussed with patient. Patient verbalized understanding and all questions were answered     Keiran Sias 04/03/2019,7:37 AM

## 2019-04-03 NOTE — Op Note (Signed)
Preoperative diagnosis:  1.  Intrauterine pregnancy at [redacted]w[redacted]d weeks gestation                                         2.  Severe chronic hypertension with superimposed                                                pre eclampsia,                                          3.  Previous Caesarean section                                         4.  Desires permanent sterilization                                         5.  Fetal malpresentation   Postoperative diagnosis:  Same as above + back down transverse lie  Procedure:  Repeat cesarean section, classical incision                     Bilateral tubal ligation using Filshie clips  Surgeon:  Florian Buff MD  Assistant:  Gerri Lins, MD  Anesthesia: Combined Spinal + epidural  Findings:  Pt has been in hospital for 6 weeks for management of her hypertension.  She is on 5 meds 4 of which are maxed out.  She also has received intermittent doses of IV anti hypertensives for management, less so as of late.  She has received 2 courses of betamethasone.  At this gestation our team feels as if there is little to be gained from a fetal standpoint to continue the pregnancy and discussed delivery with patient who agrees that, despite the risks of a [redacted] week gestation neonate, the risk benefit is now tipping to risks outweighing benefits.  As a result we have made a multilateral decision to deliver the patient at this point.    Over a classical uterine incision was delivered a viable female with Apgars pending at the time of this documentation  weighing pending lbs.  oz. Uterus, tubes and ovaries were all normal.  There were no other significant findings  Description of operation:  Patient was taken to the operating room and placed in the sitting position where she underwent a combined spinal and epidural anesthetic. She was then placed in the supine position with tilt to the left side. When adequate anesthetic level was obtained she was prepped and draped  in usual sterile fashion and a Foley catheter was placed. Abdominal traction draped was placed.  A Pfannenstiel skin incision was made and carried down sharply to the rectus fascia which was scored in the midline extended laterally. The fascia was taken off the muscles both superiorly and without difficulty. The muscles were divided.  The peritoneal cavity was entered.  Bladder blade was placed, no bladder flap was created. There were adhesions of the bladder  to the lower uterine segment.  Because the patient was [redacted] week gestation and there was no associated labor the lower uterine segment was quite narrow.  Additionally the baby was back down transverse lie.  As a result, a vertical uterine  hysterotomy incision was made, for all intents and purposes a classical Caesarean incision and delivered a viable female  infant at [phone] with Apgars of pending at the time of this documentation and  weighingpending lbs  oz.  Cord pH was obtained and was pending, it was arterial. The uterus was exteriorized. It was closed in 2 layers, the first being a running interlocking layer and the second being an imbricating layer using 0 monocryl on a CTX needle in an interrupted fashion. There was good resulting hemostasis. The uterus tubes and ovaries were all normal. Filshie clips were applied bilaterally in the mid ampullary region of the tube and complete occlusion was confirmed after application.  Peritoneal cavity was irrigated vigorously. The muscles and peritoneum were reapproximated loosely. The fascia was closed using 0 Vicryl in running fashion. Subcutaneous tissue was made hemostatic and irrigated. The skin was closed using skin staples.  Because of where the incision was made there was really not a subcutaneous layer needed.  Provena wound vacuum was placed for NPWT.  Blood loss for the procedure was 482 cc. The patient received 3 gram of Ancef prophylactically. The patient was taken to the recovery room in good stable  condition with all counts being correct x3.  EBL 482 cc  Florian Buff 04/03/2019 10:07 AM

## 2019-04-03 NOTE — Anesthesia Postprocedure Evaluation (Signed)
Anesthesia Post Note  Patient: Danielle Kent  Procedure(s) Performed: CESAREAN SECTION WITH BILATERAL TUBAL LIGATION (N/A )     Patient location during evaluation: PACU Anesthesia Type: Spinal Level of consciousness: awake Pain management: pain level controlled Vital Signs Assessment: post-procedure vital signs reviewed and stable Respiratory status: spontaneous breathing Cardiovascular status: stable Postop Assessment: no apparent nausea or vomiting, no headache, no backache, spinal receding and patient able to bend at knees Anesthetic complications: no    Last Vitals:  Vitals:   04/03/19 1115 04/03/19 1130  BP: (!) 161/97 (!) 163/95  Pulse: 73 75  Resp: 13 15  Temp:    SpO2: 95% 97%    Last Pain:  Vitals:   04/03/19 1130  TempSrc:   PainSc: 4    Pain Goal: Patients Stated Pain Goal: 0 (04/02/19 2136)              Epidural/Spinal Function Cutaneous sensation: Tingles (04/03/19 1115), Patient able to flex knees: No (04/03/19 1130), Patient able to lift hips off bed: No (04/03/19 1130), Back pain beyond tenderness at insertion site: No (04/03/19 1130), Progressively worsening motor and/or sensory loss: No (04/03/19 1130), Bowel and/or bladder incontinence post epidural: No (04/03/19 1130)  Huston Foley

## 2019-04-03 NOTE — Lactation Note (Signed)
This note was copied from a baby's chart. Lactation Consultation Note  Patient Name: Danielle Kent [id number] Date: 04/03/2019 Reason for consult: Initial assessment;NICU baby;Infant < 6lbs;Preterm <34wks P2.  Baby is [redacted] weeks gestation and 5 hours old.  Mom states she tried to breastfeed her first baby but baby would not latch.  She attempted to pump but did not produce milk.  Mom would like to provide breastmilk for her newborn.  Breasts are widely spaced.  Instructed on hand expression and one small drop of colostrum collected.  Symphony pump set up and initiated.  Instructed to pump and hand express 8-12 times in 24 hours.  Discussed milk coming to volume and reassured she may not obtain much the first few days.  Mom does not have a breast pump at home.  She does not have [place] but interested in receiving.  [place] referral faxed to [facility] office.  Encouraged to call for assist/concerns prn.  Maternal Data Has patient been taught Hand Expression?: Yes Does the patient have breastfeeding experience prior to this delivery?: No  Feeding    LATCH Score                   Interventions    Lactation Tools Discussed/Used WIC Program: No Pump Review: Setup, frequency, and cleaning;Milk Storage Initiated by:: LMoulden Date initiated:: 04/03/19   Consult Status Consult Status: Follow-up Date: 04/04/19 Follow-up type: In-patient    Ave Filter 04/03/2019, 2:29 PM

## 2019-04-03 NOTE — Anesthesia Preprocedure Evaluation (Addendum)
Anesthesia Evaluation  Patient identified by MRN, date of birth, ID band Patient awake    Reviewed: Allergy & Precautions, NPO status , Patient's Chart, lab work & pertinent test results  Airway Mallampati: IV       Dental no notable dental hx. (+) Teeth Intact   Pulmonary    Pulmonary exam normal breath sounds clear to auscultation       Cardiovascular hypertension, Pt. on medications and Pt. on home beta blockers Normal cardiovascular exam Rhythm:Regular Rate:Normal     Neuro/Psych    GI/Hepatic   Endo/Other  diabetes, Type 2Morbid obesity  Renal/GU      Musculoskeletal   Abdominal (+) + obese,   Peds  Hematology   Anesthesia Other Findings   Reproductive/Obstetrics                            Anesthesia Physical Anesthesia Plan  ASA: III  Anesthesia Plan: Combined Spinal and Epidural   Post-op Pain Management:    Induction:   PONV Risk Score and Plan: 3 and Ondansetron, Dexamethasone and Midazolam  Airway Management Planned: Nasal Cannula and Natural Airway  Additional Equipment:   Intra-op Plan:   Post-operative Plan:   Informed Consent: I have reviewed the patients History and Physical, chart, labs and discussed the procedure including the risks, benefits and alternatives for the proposed anesthesia with the patient or authorized representative who has indicated his/her understanding and acceptance.       Plan Discussed with: CRNA and Surgeon  Anesthesia Plan Comments: (  FINDINGS Left Ventricle: The left ventricle has normal systolic function of 91-47%. The cavity size was normal. There is no increased left ventricular wall thickness. Echo evidence of pseudonormalization in diastolic relaxation Elevated left ventricular  end-diastolic pressure Right Ventricle: The right ventricle has normal systolic function. The cavity was normal. There is no increase in right  ventricular wall thickness. Right ventricular systolic pressure could not be assessed. Left Atrium: left atrial size was mildly dilated Right Atrium: right atrial size was normal in size Interatrial Septum: No atrial level shunt detected by color flow Doppler.   Pericardium: There is no evidence of pericardial effusion. Mitral Valve: The mitral valve is normal in structure. Mitral valve regurgitation is not visualized by color flow Doppler. Tricuspid Valve: The tricuspid valve is normal in structure. Tricuspid valve regurgitation was not visualized by color flow Doppler. Aortic Valve: The aortic valve is normal in structure. Aortic valve regurgitation was not visualized by color flow Doppler. Pulmonic Valve: The pulmonic valve was normal in structure. Pulmonic valve regurgitation is not visualized by color flow Doppler. Venous: The )       Anesthesia Quick Evaluation

## 2019-04-03 NOTE — Discharge Summary (Signed)
Obstetrics Discharge Summary OB/GYN Faculty Practice   Patient Name: Danielle Kent DOB: 06-29-1986 MRN: [id number]  Date of admission: 02/23/2019 Delivering MD: Florian Buff   Date of discharge: 04/07/2019  Admitting diagnosis: Pregnancy at 26/3. Superimposed severe pre-eclampsia (BPs) on cHTN Intrauterine pregnancy: [redacted]w[redacted]d    Secondary diagnosis:  BMI 70s, CRI, DM2, history of prior c-section   Discharge diagnosis: Preterm Pregnancy Delivered at 32w0 for severe superimposed preeclampsia                              Postpartum procedures: none  Outpatient Follow-Up: [ ] remove Prevena and staples at 1wk post op appointment [ ] BP check and follow-up with PCP/cardiology for poorly controlled chronic HTN  Hospital course: Danielle Ruddellis a 33y.o. 34w0dho was admitted at 26w3 for elevated blood pressures. Her pregnancy was complicated by chronic HTN, morbid obesity, and history of prior C/S. She was admitted on the antepartum unit for about a month with increasing antihypertensive requirements including hydralazine, labetalol, procardia, clonidine and HCTZ. Given persistent severe range blood pressures, delivery scheduled for 32 weeks. Cardiology was consulted to assist with blood pressure management. She received betamethasone series x 2 prior to delivery as well as magnesium for both neuroprotection and seizure prophylaxis.  Delivery was complicated by classical uterine incision given concern for bladder adhesions and poorly developed lower uterine segment. A bilateral tubal ligation was also performed at the time of her C/S. Please see delivery/op note for additional details. Her postpartum course was uncomplicated. New prevena placed pod#1 for new larger canister b/c first one filled up. She had an uncomplicated post op course and was passing flatus and meeting all post op goals on the day of discharge  Breast pumping.   Physical exam  Vitals:   04/06/19 2017 04/07/19 0107 04/07/19 0604  04/07/19 0738  BP:  (!) 145/78 136/61 (!) 154/78  Pulse:  (!) 105 92 (!) 102  Resp: _0 Temp:  98.5 F (36.9 C) 98.2 F (36.8 C) 98 F (36.7 C)  TempSrc:  Oral Oral Oral  SpO2:  97% 93% 98%  Weight:      Height:       General: NAD Lochia: appropriate Uterine Fundus: nttp Incision: prevena working normally  Labs: Lab Results  Component Value Date   WBC 12.3 (H) 04/05/2019   HGB 10.0 (L) 04/05/2019   HCT 31.0 (L) 04/05/2019   MCV 87.6 04/05/2019   PLT 208 04/05/2019   CMP Latest Ref Rng & Units 04/05/2019  Glucose 70 - 99 mg/dL 116(H)  BUN 6 - 20 mg/dL 21(H)  Creatinine 0.44 - 1.00 mg/dL 1.33(H)  Sodium 135 - 145 mmol/L 137  Potassium 3.5 - 5.1 mmol/L 4.2  Chloride 98 - 111 mmol/L 102  CO2 22 - 32 mmol/L 25  Calcium 8.9 - 10.3 mg/dL 9.2  Total Protein 6.5 - 8.1 g/dL -  Total Bilirubin 0.3 - 1.2 mg/dL -  Alkaline Phos 38 - 126 U/L -  AST 15 - 41 U/L -  ALT 0 - 44 U/L -    Discharge instructions: Per After Visit Summary and "Baby and Me Booklet"  After visit meds:  Allergies as of 04/07/2019   No Known Allergies     Medication List    STOP taking these medications   Accu-Chek FastClix Lancets Misc   Accu-Chek Nano SmartView w/Device Kit   aspirin EC 81 MG  tablet   cloNIDine 0.2 MG tablet Commonly known as: CATAPRES   glucose blood test strip Commonly known as: Accu-Chek SmartView   hydrALAZINE 100 MG tablet Commonly known as: APRESOLINE     TAKE these medications   cloNIDine 0.2 mg/24hr patch Commonly known as: CATAPRES - Dosed in mg/24 hr Continue patch until 6/22 @ 1am and replace with new, lower dose patch (0.53m)   cloNIDine 0.1 mg/24hr patch Commonly known as: CATAPRES - Dosed in mg/24 hr Place 1 patch (0.1 mg total) onto the skin once a week. On 6/29 @ 1am remove old patch and apply this one Start taking on: April 18, 2019   cloNIDine 0.3 mg/24hr patch Commonly known as: CATAPRES - Dosed in mg/24 hr Place 1 patch (0.3 mg total)  onto the skin once a week.   enoxaparin 80 MG/0.8ML injection Commonly known as: LOVENOX Inject 0.8 mLs (80 mg total) into the skin daily for 14 days. Start taking on: April 08, 2019   labetalol 200 MG tablet Commonly known as: NORMODYNE Take 4 tablets (800 mg total) by mouth every 12 (twelve) hours. 10am and 10pm What changed:   how much to take  when to take this  additional instructions   NIFEdipine 90 MG 24 hr tablet Commonly known as: Procardia XL Take 1 tablet (90 mg total) by mouth every 12 (twelve) hours. 3pm and 3am What changed:   medication strength  how much to take  when to take this  additional instructions   oxyCODONE 5 MG immediate release tablet Commonly known as: Oxy IR/ROXICODONE Take 1-2 tablets (5-10 mg total) by mouth every 4 (four) hours as needed for moderate pain.   polyethylene glycol 17 g packet Commonly known as: MIRALAX / GLYCOLAX Take 17 g by mouth 2 (two) times daily for 30 days.   Prenatal Vitamin 27-0.8 MG Tabs Take 1 tablet by mouth daily.   senna-docusate 8.6-50 MG tablet Commonly known as: Senokot-S Take 2 tablets by mouth at bedtime as needed for up to 7 days for mild constipation.   simethicone 80 MG chewable tablet Commonly known as: MYLICON Chew 1 tablet (80 mg total) by mouth 3 (three) times daily after meals for 10 days.       Postpartum contraception: patient had a BTL during her c-section Diet: Routine Diet Activity: Advance as tolerated. Pelvic rest for 6 weeks.   Follow-up Appt: Future Appointments  Date Time Provider [place] 04/13/2019  3:00 PM DEmily Filbert MD [facility] None  05/05/2019  2:00 PM DSloan Leiter MD [place]   Follow-up Visit:No follow-ups on file.  Please schedule this patient for Postpartum visit in: 4 weeks with the following provider: Any provider For C/S patients schedule nurse incision check in weeks 2 weeks: yes High risk pregnancy complicated by: severe superimposed  preeclampsia, history of prior C/S, BMI 70s Delivery mode:  CS Anticipated Birth Control:  BTL done PP PP Procedures needed: incision check, BP check (incision check in 10 days from surgery) Schedule Integrated BH visit: no  Newborn Data: Live born female  Birth Weight: 3 lb 0.3 oz (1370 g) APGAR: 8, 9   Newborn Delivery   Birth date/time: 04/03/2019 09:18:00 Delivery type: C-Section, Vacuum Assisted Trial of labor: No C-section categorization: Repeat      Baby Feeding: Breast Disposition:NICU  CDurene RomansMD Attending Center for [place]

## 2019-04-03 NOTE — Progress Notes (Signed)
Called to bring patient to OR for C/S. Daralene Milch, RN was informed prior to taking patient to OR that her consent form had yet to be signed by the physician. Dr. Elly Modena was also aware. Instructed to bring patient to OR and that Dr. Elonda Husky would do informed consent with patient there. Toya Smothers, RN

## 2019-04-03 NOTE — Progress Notes (Signed)
Epidural catheter removed intact per Lyda Kalata, RN

## 2019-04-04 ENCOUNTER — Encounter (HOSPITAL_COMMUNITY): Payer: Self-pay | Admitting: *Deleted

## 2019-04-04 LAB — CBC
HCT: 28 % — ABNORMAL LOW (ref 36.0–46.0)
Hemoglobin: 9.1 g/dL — ABNORMAL LOW (ref 12.0–15.0)
MCH: 28.6 pg (ref 26.0–34.0)
MCHC: 32.5 g/dL (ref 30.0–36.0)
MCV: 88.1 fL (ref 80.0–100.0)
Platelets: 192 10*3/uL (ref 150–400)
RBC: 3.18 MIL/uL — ABNORMAL LOW (ref 3.87–5.11)
RDW: 14.5 % (ref 11.5–15.5)
WBC: 11.4 10*3/uL — ABNORMAL HIGH (ref 4.0–10.5)
nRBC: 0 % (ref 0.0–0.2)

## 2019-04-04 LAB — COMPREHENSIVE METABOLIC PANEL
ALT: 39 U/L (ref 0–44)
AST: 35 U/L (ref 15–41)
Albumin: 2.3 g/dL — ABNORMAL LOW (ref 3.5–5.0)
Alkaline Phosphatase: 69 U/L (ref 38–126)
Anion gap: 9 (ref 5–15)
BUN: 32 mg/dL — ABNORMAL HIGH (ref 6–20)
CO2: 25 mmol/L (ref 22–32)
Calcium: 9.1 mg/dL (ref 8.9–10.3)
Chloride: 101 mmol/L (ref 98–111)
Creatinine, Ser: 1.92 mg/dL — ABNORMAL HIGH (ref 0.44–1.00)
GFR calc Af Amer: 39 mL/min — ABNORMAL LOW (ref >60)
GFR calc non Af Amer: 33 mL/min — ABNORMAL LOW (ref >60)
Glucose, Bld: 107 mg/dL — ABNORMAL HIGH (ref 70–99)
Potassium: 4.5 mmol/L (ref 3.5–5.1)
Sodium: 135 mmol/L (ref 135–145)
Total Bilirubin: 0.5 mg/dL (ref 0.3–1.2)
Total Protein: 5.9 g/dL — ABNORMAL LOW (ref 6.5–8.1)

## 2019-04-04 MED ORDER — POLYETHYLENE GLYCOL 3350 17 G PO PACK
17.0000 g | PACK | Freq: Every day | ORAL | Status: DC | PRN
  Administered 2019-04-04: 17 g via ORAL
  Filled 2019-04-04: qty 1

## 2019-04-04 MED ORDER — LABETALOL HCL 200 MG PO TABS
800.0000 mg | ORAL_TABLET | Freq: Two times a day (BID) | ORAL | Status: DC
  Administered 2019-04-04 – 2019-04-07 (×6): 800 mg via ORAL
  Filled 2019-04-04 (×6): qty 4

## 2019-04-04 MED ORDER — NIFEDIPINE ER OSMOTIC RELEASE 30 MG PO TB24
60.0000 mg | ORAL_TABLET | Freq: Two times a day (BID) | ORAL | Status: DC
  Administered 2019-04-04 – 2019-04-07 (×6): 60 mg via ORAL
  Filled 2019-04-04 (×6): qty 2

## 2019-04-04 MED ORDER — NIFEDIPINE ER OSMOTIC RELEASE 30 MG PO TB24: 60.0000 mg | ORAL_TABLET | Freq: Two times a day (BID) | ORAL | Status: DC

## 2019-04-04 NOTE — Progress Notes (Signed)
OB Note    Current Vital Signs 24h Vital Sign Ranges  T 98.2 F (36.8 C) Temp  Avg: 98.1 F (36.7 C)  Min: 97.8 F (36.6 C)  Max: 98.5 F (36.9 C)  BP 130/64 BP  Min: 109/54  Max: 173/74  HR 74 Pulse  Avg: 75.7  Min: 69  Max: 91  RR 18 Resp  Avg: 16.1  Min: 9  Max: 22  SaO2 96 % Room Air SpO2  Avg: 96.8 %  Min: 91 %  Max: 100 %       24 Hour I/O Current Shift I/O  Time Ins Outs 06/14 0701 - 06/15 0700 In: 2527.5 [P.O.:840; I.V.:1687.5] Out: 1582 [Urine:1100] No intake/output data recorded.    Patient Vitals for the past 24 hrs:  BP Temp Temp src Pulse Resp SpO2  04/04/19 0805 - - - - - 96 %  04/04/19 0803 130/64 98.2 F (36.8 C) Oral 74 18 100 %  04/04/19 0603 (!) 131/59 98.1 F (36.7 C) Oral 75 18 97 %  04/04/19 0153 (!) 109/54 - - 83 20 -  04/04/19 0111 (!) 123/55 97.9 F (36.6 C) Oral 83 18 -  04/04/19 0045 - - - - - 97 %  04/04/19 0043 (!) 131/59 97.9 F (36.6 C) Oral 88 18 97 %  04/03/19 1935 129/62 98.5 F (36.9 C) Oral 84 20 99 %  04/03/19 1540 (!) 163/93 98.3 F (36.8 C) Oral 91 18 99 %  04/03/19 1439 (!) 157/102 - - 80 - -  04/03/19 1435 - - - 69 19 100 %  04/03/19 1434 - - - 73 20 100 %  04/03/19 1433 - - - 73 19 100 %  04/03/19 1432 - - - 73 (!) 21 100 %  04/03/19 1431 - - - 79 20 100 %  04/03/19 1430 (!) 157/102 98.2 F (36.8 C) Oral 77 16 99 %  04/03/19 1429 (!) 142/110 - - - - -  04/03/19 1337 (!) 163/97 - - 74 - -  04/03/19 1300 (!) 158/97 98.4 F (36.9 C) Oral 74 18 98 %  04/03/19 1238 (!) 162/89 98.1 F (36.7 C) Oral 74 16 99 %  04/03/19 1157 - - - 81 - 96 %  04/03/19 1156 - - - 78 - 97 %  04/03/19 1155 - - - 80 12 97 %  04/03/19 1154 - - - 81 15 98 %  04/03/19 1153 - - - 82 16 94 %  04/03/19 1152 - - - 79 14 98 %  04/03/19 1151 - - - 81 17 91 %  04/03/19 1150 - - - 72 (!) 22 95 %  04/03/19 1149 - - - 84 20 97 %  04/03/19 1148 - - - 76 14 98 %  04/03/19 1147 - - - 76 12 95 %  04/03/19 1146 - - - 73 14 93 %  04/03/19 1145 (!) 164/93 - -  73 13 96 %  04/03/19 1144 - - - 80 (!) 22 94 %  04/03/19 1143 - - - 81 18 92 %  04/03/19 1142 - - - 74 18 92 %  04/03/19 1141 - - - 76 20 92 %  04/03/19 1140 - - - 73 14 93 %  04/03/19 1139 - - - 73 16 98 %  04/03/19 1138 - - - 76 10 99 %  04/03/19 1137 - - - 76 16 95 %  04/03/19 1136 - - - 73 14 94 %  04/03/19 1135 - - - 76 13 99 %  04/03/19 1134 - - - 78 13 98 %  04/03/19 1133 - - - 78 17 96 %  04/03/19 1132 - - - 76 13 96 %  04/03/19 1131 - - - 74 16 95 %  04/03/19 1130 (!) 163/95 - - 75 15 97 %  04/03/19 1129 - - - 72 (!) 22 97 %  04/03/19 1128 - - - 74 15 97 %  04/03/19 1127 - - - 73 15 95 %  04/03/19 1126 - - - 72 15 96 %  04/03/19 1125 - - - 74 17 96 %  04/03/19 1124 - - - 73 15 96 %  04/03/19 1123 - - - 75 18 96 %  04/03/19 1122 - - - 71 16 99 %  04/03/19 1121 - - - 80 (!) 21 98 %  04/03/19 1120 - - - 71 17 98 %  04/03/19 1119 - - - 72 18 97 %  04/03/19 1118 - - - 74 18 97 %  04/03/19 1117 - - - 73 16 97 %  04/03/19 1116 - - - 73 12 100 %  04/03/19 1115 (!) 161/97 - - 73 13 95 %  04/03/19 1114 - - - 78 14 99 %  04/03/19 1113 - - - 72 15 96 %  04/03/19 1112 - - - 73 (!) 9 100 %  04/03/19 1111 - - - 72 19 100 %  04/03/19 1110 - - - 72 16 97 %  04/03/19 1109 - - - 70 15 97 %  04/03/19 1108 - - - 76 18 96 %  04/03/19 1107 - - - 73 15 96 %  04/03/19 1106 - - - 73 15 95 %  04/03/19 1105 (!) 166/92 - - 71 14 95 %  04/03/19 1104 - - - 74 17 96 %  04/03/19 1103 - - - 74 15 96 %  04/03/19 1102 (!) 163/101 - - 74 18 97 %  04/03/19 1101 - - - 72 17 95 %  04/03/19 1100 (!) 162/94 - - 78 14 99 %  04/03/19 1045 (!) 151/92 - - 74 14 95 %  04/03/19 1030 (!) 155/96 - - 72 (!) 9 97 %  04/03/19 1023 (!) 154/94 - - 73 10 99 %  04/03/19 1020 (!) 173/74 97.8 F (36.6 C) Oral 83 - 96 %    Pre surgery CV meds:  Clonidine 0.3mg  tid (0630/1500/2300) Hydralazine 100mg  q6h (0200/0800/1500/2000) HCTZ 50mg  qday (1000) Labetalol 800mg  q8h (MN/0800/1600) Procardia XL 90mg  q12h  (1000/2200)  Post Op Meds Clonidine patch 0.3mg  q7d (applied at MN on 6/15) Clonidine 0.3mg  tid to end on 6/16 Hydralazine (stopped) HCTZ 50mg  qday (1000) Labetalol 800mg  q8h (MN/0800/1600) Procardia XL changed to 60mg  q12h (1000/2200)  Patient with low BPs. Will touch base with cardiology re: recs. Patient is breast feeding/pumping.   Creatinine is also up. Will touch base with Renal re: recs, as well., after finding out UOP from nursing.   CMP Latest Ref Rng & Units 04/04/2019 04/03/2019 04/02/2019  Glucose 70 - 99 mg/dL 107(H) 106(H) 99  BUN 6 - 20 mg/dL 32(H) 23(H) 23(H)  Creatinine 0.44 - 1.00 mg/dL 1.92(H) 1.30(H) 1.31(H)  Sodium 135 - 145 mmol/L 135 134(L) 134(L)  Potassium 3.5 - 5.1 mmol/L 4.5 3.8 3.9  Chloride 98 - 111 mmol/L 101 100 98  CO2 22 - 32 mmol/L 25 23 25   Calcium 8.9 - 10.3 mg/dL 9.1  9.4 9.6  Total Protein 6.5 - 8.1 g/dL 5.9(L) 6.7 7.0  Total Bilirubin 0.3 - 1.2 mg/dL 0.5 0.3 0.4  Alkaline Phos 38 - 126 U/L 69 82 84  AST 15 - 41 U/L 35 24 27  ALT 0 - 44 U/L 39 44 42     Durene Romans MD Attending Center for [employer] (Faculty Practice) [phone] 04/04/2019 Time: [phone]  I spoke with Dr. Ellyn Hack and he recommended: -Continue clonidine PO until tomorrow with qwk patch reduction to taper off (0.3->0.2->0.1->d/c patch) -d/c the HCTZ -switch labetalol to 800 q12h -continue procardia xl 60 q12h -can do hydralazine PO PRN for any elevated BPs  I'll stagger her labetalol and procardia xl  They'll keep an eye on her BPs and let us know any changes  UOP this morning was 458mL so UOP approximately 155mL/hr. I will also d/c her scheduled ibuprofen  I spoke with Renal, Dr. Jimmy Footman, and he agreed with above plan and try to keep her BPs on the high normal side, too. Hopefully with decreasing her BP meds this will help with that. Repeat labs in AM.   Aletha Halim, Brooke Bonito MD Attending Center for [employer] (Faculty  Practice) 04/04/2019 Time: [phone]

## 2019-04-04 NOTE — Progress Notes (Signed)
Subjective: Postpartum Day 1: Cesarean Delivery, classical + BTL with Filshie clips Patient reports doing well no complaints minimal pain.    Objective: Vital signs in last 24 hours: Vitals:   04/04/19 0045 04/04/19 0111 04/04/19 0153 04/04/19 0603  BP:  (!) 123/55 (!) 109/54 (!) 131/59  Pulse:  83 83 75  Resp:  18 20 18   Temp:  97.9 F (36.6 C)  98.1 F (36.7 C)  TempSrc:  Oral  Oral  SpO2: 97%   97%  Weight:      Height:        Temp:  [97.8 F (36.6 C)-98.5 F (36.9 C)] 98.1 F (36.7 C) (06/15 0603) Pulse Rate:  [69-91] 75 (06/15 0603) Resp:  [9-22] 18 (06/15 0603) BP: (109-173)/(54-110) 131/59 (06/15 0603) SpO2:  [91 %-100 %] 97 % (06/15 0603)  Physical Exam:  General: alert, cooperative and no distress Lochia: appropriate Uterine Fundus: firm Incision: provena in place and working DVT Evaluation: No evidence of DVT seen on physical exam.  Recent Labs    04/03/19 0609 04/04/19 0541  HGB 12.0 9.1*  HCT 36.1 28.0*    Assessment/Plan: Status post Cesarean section. Doing well postoperatively.  Continue current care.  Weaning BP meds based on her current BP Switched to a clonidine patch to smooth the descent on dosing, stop oral today On 0.3 mg patch for a week recommend 0.2 patch for a week then 0.1 patch for a week then off which will wean over 3-4 days  Stopped apresoline oral dose and cut procardia from 90 mg to 60 mg  Appropriate hemoglobin drop  Danielle Kent Danielle Kent 04/04/2019, 7:08 AM

## 2019-04-05 LAB — BASIC METABOLIC PANEL
Anion gap: 10 (ref 5–15)
BUN: 21 mg/dL — ABNORMAL HIGH (ref 6–20)
CO2: 25 mmol/L (ref 22–32)
Calcium: 9.2 mg/dL (ref 8.9–10.3)
Chloride: 102 mmol/L (ref 98–111)
Creatinine, Ser: 1.33 mg/dL — ABNORMAL HIGH (ref 0.44–1.00)
GFR calc Af Amer: >60 mL/min (ref >60)
GFR calc non Af Amer: 52 mL/min — ABNORMAL LOW (ref >60)
Glucose, Bld: 116 mg/dL — ABNORMAL HIGH (ref 70–99)
Potassium: 4.2 mmol/L (ref 3.5–5.1)
Sodium: 137 mmol/L (ref 135–145)

## 2019-04-05 LAB — CBC
HCT: 31 % — ABNORMAL LOW (ref 36.0–46.0)
Hemoglobin: 10 g/dL — ABNORMAL LOW (ref 12.0–15.0)
MCH: 28.2 pg (ref 26.0–34.0)
MCHC: 32.3 g/dL (ref 30.0–36.0)
MCV: 87.6 fL (ref 80.0–100.0)
Platelets: 208 10*3/uL (ref 150–400)
RBC: 3.54 MIL/uL — ABNORMAL LOW (ref 3.87–5.11)
RDW: 14.5 % (ref 11.5–15.5)
WBC: 12.3 10*3/uL — ABNORMAL HIGH (ref 4.0–10.5)
nRBC: 0 % (ref 0.0–0.2)

## 2019-04-05 LAB — GLUCOSE, CAPILLARY
Glucose-Capillary: 101 mg/dL — ABNORMAL HIGH (ref 70–99)
Glucose-Capillary: 83 mg/dL (ref 70–99)
Glucose-Capillary: 144 mg/dL — ABNORMAL HIGH (ref 70–99)
Glucose-Capillary: 117 mg/dL — ABNORMAL HIGH (ref 70–99)

## 2019-04-05 NOTE — Progress Notes (Signed)
RN told tech to retake BP at 8:20, 15 minutes from the time the severe range BP was taken. BP not charted until 8:33

## 2019-04-05 NOTE — Lactation Note (Addendum)
This note was copied from a baby's chart. Lactation Consultation Note  Patient Name: Danielle Kent [id number] Date: 04/05/2019  P2, 32 hour female infant, pre-term less than [redacted] weeks gestation  in NICU. Per mom, she has used DEBP 3 times today and plans to start pumping every 3 hours for 15 minutes. Mom has been doing STS with infant. Mom want review hand expression a smear of colostrum was present in right breast.  Infant is receiving donor breast milk at this time.  Mom will continue to use DEBP and hand express to help with breast stimulation and induction of breastmilk. Mom knows to call [facility] services if she has any further questions or concerns.   Maternal Data    Feeding Feeding Type: Donor Breast Milk  LATCH Score                   Interventions    Lactation Tools Discussed/Used     Consult Status      Vicente Serene 04/05/2019, 12:21 AM

## 2019-04-05 NOTE — Lactation Note (Signed)
This note was copied from a baby's chart. Lactation Consultation Note  Patient Name: Danielle Kent Date: 04/05/2019 Reason for consult: Follow-up assessment;Preterm <34wks;Infant < 6lbs;1st time breastfeeding;NICU baby   LC Follow Up Visit:  Attempted to visit with mother, however, she is not in her room at the present time.   Consult Status Consult Status: Follow-up Date: 04/06/19 Follow-up type: In-patient    Kasumi Ditullio R Math Brazie 04/05/2019, 12:21 PM

## 2019-04-05 NOTE — Lactation Note (Signed)
This note was copied from a baby's chart. Lactation Consultation Note  Patient Name: Danielle Kent [id number] Date: 04/05/2019 Reason for consult: Follow-up assessment;Preterm <34wks;Infant < 6lbs;1st time breastfeeding;NICU baby   LC Follow Up Visit:  Second attempt to visit mother, however, she is not in her room.    Consult Status Consult Status: Follow-up Date: 04/06/19 Follow-up type: In-patient    Danielle Kent 04/05/2019, 1:09 PM

## 2019-04-05 NOTE — Consult Note (Addendum)
WOC Nurse wound consult note Reason for Consult: Consult requested to re-apply Prevena.  Pt had a Prevena negative pressure dressing applied in the OR to lower abd surgical site, the current cannister was full with  clotted blood (50cc) and the machine had stopped functioning.  Removed current dressing and assessed the incision line.  Staples are intact and well approximated, no further bleeding noted.  Prevena dressing kit obtained from Materials management and applied over staple line.  It was very difficult to obtain a seal; pt's groin is frequently moist and there is a pannus which hangs over the incision line.  Applied 3 barrier rings and additional drape before a good seal at 125mm cont suction was obtained when the patient was both lying and standing. She tolerated with mod amt discomfort.  Pt plans to discharge home with the Prevena.  More than one hour spent performing this consult.  Please refer to GYN team for further questions regarding plan of care.  Please re-consult if further assistance is needed.  Thank-you,  Dawn Engels MSN, RN, CWOCN, CWCN-AP, [phone]     

## 2019-04-05 NOTE — Progress Notes (Signed)
Daily Postpartum Note  Admission Date: 02/23/2019 Current Date: 04/05/2019 10:38 AM  Danielle Kent is a 33 y.o. [id number] POD#2 s/p  Classical uterine incision via low transverse skin incision with BTL @ 32 weeks for worsening pre-eclampsia. Patient admitted for severe pre-eclampsia (BPs) superimposed on chronic HTN.  Pregnancy complicated by: Patient Active Problem List   Diagnosis Date Noted  . Malpresentation before onset of labor 03/16/2019  . BMI 70 and over, adult ([place]) 03/15/2019  . Chronic renal insufficiency 03/15/2019  . Severe hypertension   . Chronic hypertension with superimposed severe preeclampsia 02/23/2019  . Preexisting diabetes complicating pregnancy, antepartum 12/27/2018  . Previous cesarean section 12/06/2018  . Chronic hypertension during pregnancy, antepartum 12/06/2018  . Maternal morbid obesity, antepartum ([place]) 12/06/2018  . Supervision of high risk pregnancy, antepartum 11/25/2018    Subjective:  +flatus and taking PO fine, +ambulation, no s/s of pre-eclampsia  Objective:    Current Vital Signs 24h Vital Sign Ranges  T 98 F (36.7 C) Temp  Avg: 98.1 F (36.7 C)  Min: 97.6 F (36.4 C)  Max: 98.5 F (36.9 C)  BP (!) 156/96 BP  Min: 117/52  Max: 162/99  HR 96 Pulse  Avg: 98.2  Min: 91  Max: 103  RR 18 Resp  Avg: 18  Min: 18  Max: 18  SaO2 96 % Room Air SpO2  Avg: 97.8 %  Min: 96 %  Max: 100 %       24 Hour I/O Current Shift I/O  Time Ins Outs 06/15 0701 - 06/16 0700 In: 600 [P.O.:600] Out: 700 [Urine:700] No intake/output data recorded.   Patient Vitals for the past 24 hrs:  BP Temp Temp src Pulse Resp SpO2  04/05/19 0833 (!) 156/96 - - - - -  04/05/19 0805 (!) 162/99 98 F (36.7 C) Oral 96 18 96 %  04/05/19 0305 (!) 153/85 98.5 F (36.9 C) Oral 98 18 100 %  04/04/19 1938 (!) 145/89 97.6 F (36.4 C) Oral (!) 103 18 100 %  04/04/19 1550 139/81 98 F (36.7 C) Oral 91 18 96 %  04/04/19 1119 (!) 117/52 98.2 F (36.8 C) Oral (!) 103 18 97 %    Physical exam: General: Well nourished, well developed female in no acute distress. Abdomen: obese, rare BS, c/d/i low transverse skin incision with staples in place. Prevena removed.  Cardiovascular: S1, S2 normal, no murmur, rub or gallop, regular rate and rhythm Respiratory: CTAB Skin: Warm and dry.   Medications: Current Facility-Administered Medications  Medication Dose Route Frequency Provider Last Rate Last Dose  . cloNIDine (CATAPRES - Dosed in mg/24 hr) patch 0.3 mg  0.3 mg Transdermal Weekly Florian Buff, MD   0.3 mg at 04/04/19 0049  . coconut oil  1 application Topical PRN Florian Buff, MD      . witch hazel-glycerin (TUCKS) pad 1 application  1 application Topical PRN Eure, [name], MD       And  . dibucaine (NUPERCAINAL) 1 % rectal ointment 1 application  1 application Rectal PRN Florian Buff, MD      . diphenhydrAMINE (BENADRYL) injection 12.5 mg  12.5 mg Intravenous Q4H PRN Hatchett, Mateo Flow, MD       Or  . diphenhydrAMINE (BENADRYL) capsule 25 mg  25 mg Oral Q4H PRN Hatchett, Mateo Flow, MD      . enoxaparin (LOVENOX) injection 80 mg  80 mg Subcutaneous Q24H Florian Buff, MD   80 mg at 04/05/19 0836  .  gabapentin (NEURONTIN) capsule 200 mg  200 mg Oral TID Florian Buff, MD   200 mg at 04/05/19 0942  . hydrALAZINE (APRESOLINE) injection 10-20 mg  10-20 mg Intravenous Q4H PRN Florian Buff, MD      . labetalol (NORMODYNE) tablet 800 mg  800 mg Oral Q12H Aletha Halim, MD   800 mg at 04/05/19 0942  . menthol-cetylpyridinium (CEPACOL) lozenge 3 mg  1 lozenge Oral Q2H PRN Florian Buff, MD      . nalbuphine (NUBAIN) injection 5 mg  5 mg Intravenous Q4H PRN Hatchett, Mateo Flow, MD       Or  . nalbuphine (NUBAIN) injection 5 mg  5 mg Subcutaneous Q4H PRN Hatchett, Mateo Flow, MD      . nalbuphine (NUBAIN) injection 5 mg  5 mg Intravenous Once PRN Lyn Hollingshead, MD       Or  . nalbuphine (NUBAIN) injection 5 mg  5 mg Subcutaneous Once PRN Lyn Hollingshead,  MD      . naloxone [facility]) injection 0.4 mg  0.4 mg Intravenous PRN Lyn Hollingshead, MD       And  . sodium chloride flush (NS) 0.9 % injection 3 mL  3 mL Intravenous PRN Hatchett, Mateo Flow, MD      . naloxone HCl (NARCAN) 2 mg in dextrose 5 % 250 mL infusion  1-4 mcg/kg/hr Intravenous Continuous PRN Hatchett, Mateo Flow, MD      . NIFEdipine (PROCARDIA-XL/NIFEDICAL-XL) 24 hr tablet 60 mg  60 mg Oral Q12H Aletha Halim, MD   60 mg at 04/05/19 0306  . ondansetron (ZOFRAN) injection 4 mg  4 mg Intravenous Q8H PRN Hatchett, Franklin, MD      . oxyCODONE (Oxy IR/ROXICODONE) immediate release tablet 5-10 mg  5-10 mg Oral Q4H PRN Florian Buff, MD   10 mg at 04/05/19 1035  . polyethylene glycol (MIRALAX / GLYCOLAX) packet 17 g  17 g Oral Daily PRN Anyanwu, Ugonna A, MD   17 g at 04/04/19 1842  . prenatal multivitamin tablet 1 tablet  1 tablet Oral Q1200 Florian Buff, MD   1 tablet at 04/05/19 1035  . scopolamine (TRANSDERM-SCOP) 1 MG/3DAYS 1.5 mg  1 patch Transdermal Once Hatchett, Franklin, MD      . senna-docusate (Senokot-S) tablet 2 tablet  2 tablet Oral Q24H Florian Buff, MD   2 tablet at 04/05/19 0024  . simethicone (MYLICON) chewable tablet 80 mg  80 mg Oral TID PC Florian Buff, MD   80 mg at 04/05/19 0836  . zolpidem (AMBIEN) tablet 5 mg  5 mg Oral QHS PRN Florian Buff, MD        Labs:  Recent Labs  Lab 04/03/19 [phone] 04/04/19 0541 04/05/19 0547  WBC 9.8 11.4* 12.3*  HGB 12.0 9.1* 10.0*  HCT 36.1 28.0* 31.0*  PLT 242 192 208    Recent Labs  Lab 04/02/19 0744 04/03/19 0609 04/04/19 0857 04/05/19 0547  NA 134* 134* 135 137  K 3.9 3.8 4.5 4.2  CL 98 100 101 102  CO2 25 23 25 25   BUN 23* 23* 32* 21*  CREATININE 1.31* 1.30* 1.92* 1.33*  CALCIUM 9.6 9.4 9.1 9.2  PROT 7.0 6.7 5.9*  --   BILITOT 0.4 0.3 0.5  --   ALKPHOS 84 82 69  --   ALT 42 44 39  --   AST 27 24 35  --   GLUCOSE 99 106* 107* 116*   CBG (last 3)  Recent Labs  04/03/19 0606 04/03/19 1059  04/05/19 0826  GLUCAP 97 113* 101*    Radiology:  No new imaging  Assessment & Plan:  Pt stable *Postpartum: patient doing well. Will try and get larger prevena canister and WOC RN to place. Removal staples at pod# 7. Patient is breast pumping *cHTN with superimposed severe pre-x: continue to follow closely. Pt is on less BP meds than before c-section and was running low but now starting to trend up.  -11/2018 echo unremarkable *BMI 70s: see above.  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  improving *DM2: CBG order wasn't it. Placed for am fasting and 2hr post prandial. Pt was started on insulin just prior to delivery *PPx: lovenox, oob ad lib *FEN/GI: DM2 diet. SLIV *Dispo: likely POD#-4  Durene Romans MD Attending Center for [employer] (Faculty Practice) 04/05/2019

## 2019-04-05 NOTE — Progress Notes (Signed)
RN called MD to notify of dressing status. Prevena dressing saturated, canister full, and no longer suctioning. Order to reinforce with ABD pads and dressing change in AM. RN placed order for wound RN to assist as it is a complicated dressing change.   Nicholes Calamity, RN

## 2019-04-06 LAB — GLUCOSE, CAPILLARY
Glucose-Capillary: 108 mg/dL — ABNORMAL HIGH (ref 70–99)
Glucose-Capillary: 118 mg/dL — ABNORMAL HIGH (ref 70–99)
Glucose-Capillary: 117 mg/dL — ABNORMAL HIGH (ref 70–99)
Glucose-Capillary: 131 mg/dL — ABNORMAL HIGH (ref 70–99)

## 2019-04-06 MED ORDER — POLYETHYLENE GLYCOL 3350 17 G PO PACK
17.0000 g | PACK | Freq: Every day | ORAL | Status: DC
  Administered 2019-04-06 – 2019-04-07 (×2): 17 g via ORAL
  Filled 2019-04-06 (×2): qty 1

## 2019-04-06 NOTE — Progress Notes (Signed)
   Brief cardiology follow-up.  I have reviewed blood pressures.  Seems to be relatively stable on current medications. Hydralazine has been discontinued. Still have room to increase up to 90 mg Procardia, but additional titration can be done as an outpatient.  Contact [facility] (from [facility]) for assistance in setting up follow-up visit with patient's cardiologist.   Glenetta Hew, MD

## 2019-04-06 NOTE — Lactation Note (Signed)
This note was copied from a baby's chart. Lactation Consultation Note  Patient Name: Danielle Kent [id number] Date: 04/06/2019   Baby 77 hours old in NICU.  Mother states she has been pumping q 3 hours receiving small amounts of colostrum. She states she will call [facility] today to inquire about a DEBP. Discussed hands on pumping and hand expression before and after pumping.          Maternal Data    Feeding Feeding Type: Donor Breast Milk  LATCH Score                   Interventions    Lactation Tools Discussed/Used     Consult Status      Carlye Grippe 04/06/2019, 10:58 AM

## 2019-04-06 NOTE — Progress Notes (Signed)
  Follow up visit to [place] upon referral from [employer] who reported she met with MOB last week and she was beginning to feel anxious about delivery of her daughter at 31 weeks.  Mona shared that delivery went well and she is so happy by how well Lenward Chancellor is doing. She reports she only had to be on oxygen for a short period of time and is upping her feeds and sucking on a pacifier.  SHe was holding the baby skin to skin when I visited at [facility] bedside.  Lilyan Punt shared that she is feeling bitter sweet about discharge tomorrow.  She knows her daughter Landry Dyke will be glad to have her home and she's looking forward to more time with her and being at home, but really sad about leaving [facility] and concerned about how she'll balance it all.  She plans to take some time tonight to quietly reflect and create a plan.  She knows that Chad has been doing well with her grandmother, who is also a great support to [place].  I normalized her feelings of joy and concern as she tries to figure out how to parent both of her daughters who will be in separate places for a while.  I affirmed her and reminded her that we can be a continued support along the way.  WIll continue to follow.  Please page as further needs arise.  Donald Prose. Elyn Peers, M.Div. [facility] Chaplain Pager [phone] Office [phone]   04/06/19 1220  Clinical Encounter Type  Visited With Patient and family together  Visit Type Spiritual support  Stress Factors  Patient Stress Factors Major life changes

## 2019-04-06 NOTE — Progress Notes (Signed)
CLINICAL SOCIAL WORK MATERNAL/CHILD NOTE  Patient Details  Name: Danielle Kent MRN: [id number] Date of Birth: 04/03/2020  Date:  04/06/2019  Clinical Social Worker Initiating Note:  Abundio Miu, [place] Date/Time: Initiated:  04/06/19/1048     Child's Name:      Biological Parents:  Mother   Need for Interpreter:  None   Reason for Referral:  Parental Support of Premature Babies < 32 weeks/or Critically Ill babies   Address:  [address] [place] [address]    Phone number:  [phone] (home)     Additional phone number:   Household Members/Support Persons (HM/SP):   Household Member/Support Person 1   HM/SP Name Relationship DOB or Age  HM/SP -1 Danielle Kent daughter 06-06-10  HM/SP -2        HM/SP -3        HM/SP -4        HM/SP -5        HM/SP -6        HM/SP -7        HM/SP -8          Natural Supports (not living in the home):  Parent, Friends, Immediate Family   Professional Supports: None   Employment: Full-time   Type of Work: [employer]   Education:  [place] arranged:    Museum/gallery curator Resources:  [employer]   Other Resources:  [employer], Physicist, medical , [employer]    Cultural/Religious Considerations Which May Impact Care:    Strengths:  Ability to meet basic needs , Home prepared for child , Understanding of illness   Psychotropic Medications:         Pediatrician:       Pediatrician List:   [place]      Pediatrician Fax Number:    Risk Factors/Current Problems:  None   Cognitive State:  Alert , Able to Concentrate , Goal Oriented , Insightful    Mood/Affect:  Interested , Calm , Relaxed    CSW Assessment: CSW met with MOB at bedside to discuss infant's NICU admission. MOB was standing bedside infant's isolette. CSW introduced self and explained reason for consult. MOB was welcoming and engaged during  assessment. 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 MOB reported that she receives both [facility] and [employer]. MOB reported that she has all items needed for infant including a crib and car seat. MOB reported that she plans to meet with [facility] to pick up a basinet. CSW inquired about MOB's support system, MOB reported that her mother, sister and best friend are her supports. CSW inquired about FOB, MOB declined to share his name.   CSW inquired about MOB's mental health history, MOB denied any mental health history and any history of postpartum depression. MOB presented calm and did not demonstrate any acute mental health signs/symptoms. CSW assessed for safety, MOB denied SI, HI and domestic violence.   CSW provided education regarding the baby blues period vs. perinatal mood disorders, discussed treatment and gave resources for mental health follow up if concerns arise.  CSW recommends self-evaluation during the postpartum time period using the New Mom Checklist from Postpartum Progress and encouraged MOB to contact a medical professional if symptoms are noted at any time.  CSW and MOB discussed infant's NICU admission. MOB reported that it has been good and she feels well informed. CSW informed MOB about the NICU, what to expect and resources/supports available while infant is admitted to the NICU. MOB denied any questions/concerns. CSW provided MOB with a butterfly from [employer].   CSW will continue to offer resources/supports while infant is admitted to the NICU.    CSW Plan/Description:  Perinatal Mood and Anxiety Disorder (PMADs) Education, Other Patient/Family Education    Burnis Medin, LCSW 04/06/2019, 10:53 AM

## 2019-04-06 NOTE — Progress Notes (Signed)
Daily Postpartum Note  Admission Date: 02/23/2019 Current Date: 04/06/2019 9:34 AM  Danielle Kent is a 33 y.o. [id number] POD#3 s/p  Classical uterine incision via low transverse skin incision with BTL @ 32 weeks for worsening pre-eclampsia. Patient admitted for severe pre-eclampsia (BPs) superimposed on chronic HTN.  Pregnancy complicated by: Patient Active Problem List   Diagnosis Date Noted  . Malpresentation before onset of labor 03/16/2019  . BMI 70 and over, adult ([place]) 03/15/2019  . Chronic renal insufficiency 03/15/2019  . Severe hypertension   . Chronic hypertension with superimposed severe preeclampsia 02/23/2019  . Preexisting diabetes complicating pregnancy, antepartum 12/27/2018  . Previous cesarean section 12/06/2018  . Chronic hypertension during pregnancy, antepartum 12/06/2018  . Maternal morbid obesity, antepartum ([place]) 12/06/2018  . Supervision of high risk pregnancy, antepartum 11/25/2018    Subjective:  No s/s of pre-eclampsia, meeting all post op goals except not passing much flatus  Objective:    Current Vital Signs 24h Vital Sign Ranges  T 98.6 F (37 C) Temp  Avg: 98.4 F (36.9 C)  Min: 98.2 F (36.8 C)  Max: 98.9 F (37.2 C)  BP (!) 157/75(RN BEDSIDE) BP  Min: 137/70  Max: 157/75  HR 94 Pulse  Avg: 102.4  Min: 92  Max: 109  RR 18 Resp  Avg: 17.8  Min: 17  Max: 18  SaO2 98 % Room Air SpO2  Avg: 97.2 %  Min: 95 %  Max: 99 %       24 Hour I/O Current Shift I/O  Time Ins Outs 06/16 0701 - 06/17 0700 In: -  Out: 50 [Drains:50] No intake/output data recorded.   Patient Vitals for the past 24 hrs:  BP Temp Temp src Pulse Resp SpO2  04/06/19 0740 (!) 157/75 98.6 F (37 C) Oral 94 18 98 %  04/06/19 0609 137/70 98.3 F (36.8 C) Oral 92 18 99 %  04/05/19 2101 (!) 154/79 98.2 F (36.8 C) - (!) 109 18 98 %  04/05/19 1555 (!) 144/74 98.9 F (37.2 C) Oral (!) 108 17 96 %  04/05/19 1117 (!) 156/98 98.2 F (36.8 C) Oral (!) 109 18 95 %   Physical  exam: General: Well nourished, well developed female in no acute distress. Abdomen: obese, rare BS. New prevena in place and working well Cardiovascular: S1, S2 normal, no murmur, rub or gallop, regular rate and rhythm Respiratory: CTAB Skin: Warm and dry.   Medications: Current Facility-Administered Medications  Medication Dose Route Frequency Provider Last Rate Last Dose  . cloNIDine (CATAPRES - Dosed in mg/24 hr) patch 0.3 mg  0.3 mg Transdermal Weekly Florian Buff, MD   0.3 mg at 04/04/19 0049  . coconut oil  1 application Topical PRN Florian Buff, MD      . witch hazel-glycerin (TUCKS) pad 1 application  1 application Topical PRN Eure, [name], MD       And  . dibucaine (NUPERCAINAL) 1 % rectal ointment 1 application  1 application Rectal PRN Florian Buff, MD      . diphenhydrAMINE (BENADRYL) injection 12.5 mg  12.5 mg Intravenous Q4H PRN Hatchett, Mateo Flow, MD       Or  . diphenhydrAMINE (BENADRYL) capsule 25 mg  25 mg Oral Q4H PRN Hatchett, Mateo Flow, MD      . enoxaparin (LOVENOX) injection 80 mg  80 mg Subcutaneous Q24H Florian Buff, MD   80 mg at 04/06/19 0739  . gabapentin (NEURONTIN) capsule 200 mg  200 mg  Oral TID Florian Buff, MD   200 mg at 04/05/19 2106  . hydrALAZINE (APRESOLINE) injection 10-20 mg  10-20 mg Intravenous Q4H PRN Florian Buff, MD      . labetalol (NORMODYNE) tablet 800 mg  800 mg Oral Q12H Aletha Halim, MD   800 mg at 04/05/19 2106  . menthol-cetylpyridinium (CEPACOL) lozenge 3 mg  1 lozenge Oral Q2H PRN Florian Buff, MD      . nalbuphine (NUBAIN) injection 5 mg  5 mg Intravenous Q4H PRN Hatchett, Mateo Flow, MD       Or  . nalbuphine (NUBAIN) injection 5 mg  5 mg Subcutaneous Q4H PRN Hatchett, Mateo Flow, MD      . nalbuphine (NUBAIN) injection 5 mg  5 mg Intravenous Once PRN Lyn Hollingshead, MD       Or  . nalbuphine (NUBAIN) injection 5 mg  5 mg Subcutaneous Once PRN Lyn Hollingshead, MD      . naloxone [facility]) injection 0.4 mg  0.4  mg Intravenous PRN Lyn Hollingshead, MD       And  . sodium chloride flush (NS) 0.9 % injection 3 mL  3 mL Intravenous PRN Hatchett, Mateo Flow, MD      . naloxone HCl (NARCAN) 2 mg in dextrose 5 % 250 mL infusion  1-4 mcg/kg/hr Intravenous Continuous PRN Hatchett, Mateo Flow, MD      . NIFEdipine (PROCARDIA-XL/NIFEDICAL-XL) 24 hr tablet 60 mg  60 mg Oral Q12H Aletha Halim, MD   60 mg at 04/06/19 0250  . ondansetron (ZOFRAN) injection 4 mg  4 mg Intravenous Q8H PRN Hatchett, [place], MD      . oxyCODONE (Oxy IR/ROXICODONE) immediate release tablet 5-10 mg  5-10 mg Oral Q4H PRN Florian Buff, MD   10 mg at 04/06/19 0739  . polyethylene glycol (MIRALAX / GLYCOLAX) packet 17 g  17 g Oral Daily PRN Anyanwu, Ugonna A, MD   17 g at 04/04/19 1842  . prenatal multivitamin tablet 1 tablet  1 tablet Oral Q1200 Florian Buff, MD   1 tablet at 04/05/19 1035  . scopolamine (TRANSDERM-SCOP) 1 MG/3DAYS 1.5 mg  1 patch Transdermal Once Hatchett, Franklin, MD      . senna-docusate (Senokot-S) tablet 2 tablet  2 tablet Oral Q24H Florian Buff, MD   2 tablet at 04/05/19 2259  . simethicone (MYLICON) chewable tablet 80 mg  80 mg Oral TID PC Florian Buff, MD   80 mg at 04/06/19 0738  . zolpidem (AMBIEN) tablet 5 mg  5 mg Oral QHS PRN Florian Buff, MD        Labs:  Recent Labs  Lab 04/03/19 [phone] 04/04/19 0541 04/05/19 0547  WBC 9.8 11.4* 12.3*  HGB 12.0 9.1* 10.0*  HCT 36.1 28.0* 31.0*  PLT 242 192 208    Recent Labs  Lab 04/02/19 0744 04/03/19 0609 04/04/19 0857 04/05/19 0547  NA 134* 134* 135 137  K 3.9 3.8 4.5 4.2  CL 98 100 101 102  CO2 25 23 25 25   BUN 23* 23* 32* 21*  CREATININE 1.31* 1.30* 1.92* 1.33*  CALCIUM 9.6 9.4 9.1 9.2  PROT 7.0 6.7 5.9*  --   BILITOT 0.4 0.3 0.5  --   ALKPHOS 84 82 69  --   ALT 42 44 39  --   AST 27 24 35  --   GLUCOSE 99 106* 107* 116*   Results for SHABREE, TEBBETTS (MRN [id number]) as of 04/06/2019 10:16  Ref. Range 04/05/2019  11:15 04/05/2019 17:06  04/05/2019 23:02 04/06/2019 06:06 04/06/2019 10:04  Glucose-Capillary Latest Ref Range: 70 - 99 mg/dL 83 144 (H) 117 (H) 108 (H) 118 (H)   Radiology:  No new imaging  Assessment & Plan:  Pt stable *Postpartum: patient doing well. Continue prevena until 6/23 with staples. Patient is breast pumping *cHTN with superimposed severe pre-x: continue to follow closely. Pt is on less BP meds than before c-section. BPs stable -11/2018 echo unremarkable *BMI 70s: see above.  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  improved *DM2: doing well on no meds *PPx: lovenox, oob ad lib *FEN/GI: DM2 diet. SLIV *Dispo: likely tomorrow Aletha Halim, Brooke Bonito MD Attending Center for [employer] (Faculty Practice) 04/06/2019

## 2019-04-07 LAB — GLUCOSE, CAPILLARY
Glucose-Capillary: 75 mg/dL (ref 70–99)
Glucose-Capillary: 109 mg/dL — ABNORMAL HIGH (ref 70–99)

## 2019-04-07 MED ORDER — NIFEDIPINE ER OSMOTIC RELEASE 30 MG PO TB24
90.0000 mg | ORAL_TABLET | Freq: Two times a day (BID) | ORAL | Status: DC
  Administered 2019-04-07: 15:00:00 90 mg via ORAL
  Filled 2019-04-07: qty 3

## 2019-04-07 MED ORDER — CLONIDINE 0.1 MG/24HR TD PTWK: 0.1000 mg | MEDICATED_PATCH | TRANSDERMAL | 0 refills | Status: DC

## 2019-04-07 MED ORDER — OXYCODONE HCL 5 MG PO TABS: 5.0000 mg | ORAL_TABLET | ORAL | 0 refills | Status: DC | PRN

## 2019-04-07 MED ORDER — CLONIDINE HCL 0.3 MG/24HR TD PTWK: 0.3000 mg | MEDICATED_PATCH | TRANSDERMAL | 12 refills | Status: DC

## 2019-04-07 MED ORDER — NIFEDIPINE ER OSMOTIC RELEASE 90 MG PO TB24: 90.0000 mg | ORAL_TABLET | Freq: Two times a day (BID) | ORAL | 1 refills | Status: DC

## 2019-04-07 MED ORDER — CLONIDINE 0.2 MG/24HR TD PTWK: MEDICATED_PATCH | TRANSDERMAL | 0 refills | Status: DC

## 2019-04-07 MED ORDER — LABETALOL HCL 200 MG PO TABS: 800.0000 mg | ORAL_TABLET | Freq: Two times a day (BID) | ORAL | 0 refills | Status: DC

## 2019-04-07 MED ORDER — SENNOSIDES-DOCUSATE SODIUM 8.6-50 MG PO TABS: 2.0000 | ORAL_TABLET | Freq: Every evening | ORAL | 0 refills | Status: AC | PRN

## 2019-04-07 MED ORDER — SIMETHICONE 80 MG PO CHEW: 80.0000 mg | CHEWABLE_TABLET | Freq: Three times a day (TID) | ORAL | 0 refills | Status: DC

## 2019-04-07 MED ORDER — POLYETHYLENE GLYCOL 3350 17 G PO PACK: 17.0000 g | PACK | Freq: Two times a day (BID) | ORAL | 0 refills | Status: AC

## 2019-04-07 MED ORDER — ENOXAPARIN SODIUM 80 MG/0.8ML ~~LOC~~ SOLN: 80.0000 mg | SUBCUTANEOUS | 0 refills | Status: DC

## 2019-04-07 MED ORDER — ENOXAPARIN (LOVENOX) PATIENT EDUCATION KIT
PACK | Freq: Once | Status: AC
  Administered 2019-04-07: 12:00:00
  Filled 2019-04-07: qty 1

## 2019-04-07 NOTE — Lactation Note (Signed)
This note was copied from a baby's chart. Lactation Consultation Note  Patient Name: Danielle Kent Date: 04/07/2019 Reason for consult: Follow-up assessment  1327 - 1351 - I visited Danielle Kent to check on her progress with pumping. She states that she can now fill two colostrum containers with her expressed breast milk. I showed Danielle Kent how to switch her pump setting to the expression phase. We also reviewed her flange size. She is using a size 24 flange, but the 27 may be a better fit. I recommended that she try using a 27 flange the next time she pumps and to compare how it feels and her pumping volume with her current flange.  Danielle Kent has already been in touch with [facility] and she plans to obtain a DEBP from them next Wednesday 6/24. She plans to room in with her daughter in the NICU and pump there in lieu of going home. I explained our [facility] loaner pump program, and she expressed interest. I dropped off the paperwork, and [name] will call when she is able to pay the deposit for the breast pump.  I also showed Danielle Kent how to use her piston pump as a single and a double manual pump. I also discussed pumping bras, and suggested that she investigate a particular pumping harness that is easy to adjust and is affordable.   Danielle Kent will call for any additional needs or supplies. I encouraged her to continue to pump every three hours particularly in these early days of pumping to help increase her volume. She states that her breasts feel fuller. I noted that her breasts were soft with small nipples and slightly wide spaced.   Danielle Kent had no further questions at this time.    Feeding Feeding Type: Formula   Interventions Interventions: Breast feeding basics reviewed;DEBP(piston pump set up; [facility] loaner process)  Lactation Tools Discussed/Used WIC Program: Yes Pump Review: Setup, frequency, and cleaning;Milk Storage   Consult Status Consult Status: PRN Follow-up type: Call as  needed    Danielle Kent 04/07/2019, 2:24 PM

## 2019-04-13 ENCOUNTER — Other Ambulatory Visit: Payer: Self-pay

## 2019-04-13 ENCOUNTER — Ambulatory Visit (INDEPENDENT_AMBULATORY_CARE_PROVIDER_SITE_OTHER): Payer: Medicaid Other | Admitting: Obstetrics & Gynecology

## 2019-04-13 VITALS — BP 162/99 | HR 93 | Ht 60.0 in | Wt 343.6 lb

## 2019-04-13 DIAGNOSIS — Z6841 Body Mass Index (BMI) 40.0 and over, adult: Secondary | ICD-10-CM

## 2019-04-13 DIAGNOSIS — O119 Pre-existing hypertension with pre-eclampsia, unspecified trimester: Secondary | ICD-10-CM

## 2019-04-13 DIAGNOSIS — Z9889 Other specified postprocedural states: Secondary | ICD-10-CM

## 2019-04-13 DIAGNOSIS — O1490 Unspecified pre-eclampsia, unspecified trimester: Secondary | ICD-10-CM

## 2019-04-13 NOTE — Progress Notes (Signed)
Pt is in the office for pp incision and BP check. Pt had c section on 04-03-19. Pt is currently breastfeeding. [place]=0 Pt denies headaches, dizziness, blurred vision. Pt states that she has not picked clonidine patch up from the pharmacy yet.

## 2019-04-13 NOTE — Progress Notes (Signed)
   Subjective:    Patient ID: Danielle Kent, female    DOB: Feb 17, 1986, 33 y.o.   MRN: [id number]  HPI 33 yo lady here for staple removal and BP check. She just got things straightened out with pharmacy today so that she can pick up her clonidine patch.   Review of Systems     Objective:   Physical Exam Breathing, conversing, and ambulating normally Well nourished, well hydrated Black female, no apparent distress Wound vac removed Incision beneath large panus with foul odor Staples removed and incision cleaned No sign of infection     Assessment & Plan:  H/o severe pre eclampsia s/p c/s and BTL Elevated BPs- rec restart clonidine patch in addition to procardia and labetalol Come back 1 week for BP check

## 2019-04-18 ENCOUNTER — Ambulatory Visit: Payer: Self-pay

## 2019-04-18 NOTE — Lactation Note (Signed)
This note was copied from a baby's chart. Lactation Consultation Note  Patient Name: Danielle Kent CHENI'D Date: 04/18/2019 Reason for consult: Preterm <34wks;NICU baby;Maternal endocrine disorder(see LC note ) Type of Endocrine Disorder?: Diabetes(GDM ), asthma , high blood pressure.  LC was called about 1440 to see mom in NICU 324 - and was told feedings were at 3 and 6 pm.  Baby is a premie GA - 320/7 , PMA 341/7.  Mom acted surprised to see [place] .  Mom shared her pumping story and mentioned she was following consistent pumping 8 x's a day and hand expressing up until 4 days when she experienced constipation and terrible gas pain and stopped pumping for 4-5 days. Mom mentioned her  Milk did come in just like her 1st baby. She resumed pumping at 5 days and was getting 2-3 oz total, #24 F to start and switch to the #27 F and seemed to get more milk.  Today has only pumped at 8 am and drops, nights was using the hand pump with drops.  LC reviewed supply and demand and importance of consistent pumping around the clock ( especially once at night) both breast for 15 -20 mins , center the pump pieces and then not to stare at them.  LC stressed the fact her breast and brain missed out on 4-5 days of stimulation therefore per milk supply was effected and we only can start from today,  [place] reviewed - re- lactating plan .  Consistent pumping 8-12 times a day both breast for 15 - 20 mins .  Prior to pumping apply warm moist wash clothes to allow the milk ducts to relax and breast massage , hand express, pump LC also recommended one of those pumping could be a "power pump " for 60 mins - ( 2 options 20 mins on 10 mins off over 60 mins , or 10 mins on 10 mins off over 60 mins) one a day.  LC stressed the importance - that it is a slow process.   LC informed mom since she is an Asthmatic she can't take Mother's love products due to [place].      Maternal Data    Feeding Feeding Type: Donor Breast  Milk  LATCH Score                   Interventions    Lactation Tools Discussed/Used Pump Review: Setup, frequency, and cleaning   Consult Status Consult Status: PRN Follow-up type: In-patient    [place] 04/18/2019, 3:31 PM

## 2019-04-21 ENCOUNTER — Other Ambulatory Visit: Payer: Self-pay

## 2019-04-21 ENCOUNTER — Ambulatory Visit (INDEPENDENT_AMBULATORY_CARE_PROVIDER_SITE_OTHER): Payer: Self-pay

## 2019-04-21 VITALS — BP 145/87 | HR 84

## 2019-04-21 DIAGNOSIS — O119 Pre-existing hypertension with pre-eclampsia, unspecified trimester: Secondary | ICD-10-CM

## 2019-04-21 DIAGNOSIS — O115 Pre-existing hypertension with pre-eclampsia, complicating the puerperium: Secondary | ICD-10-CM

## 2019-04-21 NOTE — Progress Notes (Signed)
I have reviewed this chart and agree with the RN/CMA assessment and management.    K. Meryl Davis, M.D. [facility]ending [facility] (Faculty Practice)   

## 2019-04-21 NOTE — Progress Notes (Signed)
Pt delivered on 6/14 at [redacted] weeks gestation via C section. Pt is here for BP check. Pt reports no symptoms at this time. Pt reports incision is healing well. BP and meds reviewed with Dr. Rosana Hoes today, Dr. Rosana Hoes advises patient continue with current medication regimen. Pt has PP appt scheduled for 05/05/19.

## 2019-05-05 ENCOUNTER — Ambulatory Visit: Payer: Medicaid Other | Admitting: Obstetrics and Gynecology

## 2019-05-06 ENCOUNTER — Telehealth: Payer: Self-pay | Admitting: *Deleted

## 2019-05-06 NOTE — Telephone Encounter (Signed)
Pt called to office stating she was having problem getting her Clonidine patch Rx, needs a PA completed.  Call placed to pharmacy.  Medicaid will cover Brand not generic.  Medication changed to Brand for coverage.  Pt has been made aware of change.

## 2019-05-09 ENCOUNTER — Other Ambulatory Visit: Payer: Self-pay | Admitting: Obstetrics and Gynecology

## 2019-05-16 NOTE — Telephone Encounter (Signed)
Please follow up with this with Dr. Rosana Hoes since she saw her last. Thank you

## 2019-05-20 ENCOUNTER — Ambulatory Visit (INDEPENDENT_AMBULATORY_CARE_PROVIDER_SITE_OTHER): Payer: Medicaid Other | Admitting: Family Medicine

## 2019-05-20 ENCOUNTER — Encounter: Payer: Self-pay | Admitting: Family Medicine

## 2019-05-20 DIAGNOSIS — Z1389 Encounter for screening for other disorder: Secondary | ICD-10-CM

## 2019-05-20 DIAGNOSIS — Z6841 Body Mass Index (BMI) 40.0 and over, adult: Secondary | ICD-10-CM

## 2019-05-20 DIAGNOSIS — O99210 Obesity complicating pregnancy, unspecified trimester: Secondary | ICD-10-CM

## 2019-05-20 DIAGNOSIS — N189 Chronic kidney disease, unspecified: Secondary | ICD-10-CM

## 2019-05-20 DIAGNOSIS — O10919 Unspecified pre-existing hypertension complicating pregnancy, unspecified trimester: Secondary | ICD-10-CM

## 2019-05-20 MED ORDER — CLONIDINE HCL 0.1 MG PO TABS: 0.1000 mg | ORAL_TABLET | Freq: Three times a day (TID) | ORAL | 3 refills | Status: DC

## 2019-05-20 NOTE — Progress Notes (Signed)
I connected with patient on 05/20/19 at 10:00 AM EDT by: verified that I am speaking with the correct person using two identifiers.  Patient is located at home and provider is located at [place].     The purpose of this virtual visit is to provide medical care while limiting exposure to the novel coronavirus. I discussed the limitations, risks, security and privacy concerns of performing an evaluation and management service by Dr Nehemiah Settle and the availability of in person appointments. I also discussed with the patient that there may be a patient responsible charge related to this service. By engaging in this virtual visit, you consent to the provision of healthcare.  Additionally, you authorize for your insurance to be billed for the services provided during this visit.  The patient expressed understanding and agreed to proceed.  The following staff members participated in the virtual visit:    Post Partum Visit Note Subjective:    [name] is a 33 y.o. G36P1102 female who presents for a postpartum visit. She is 7 weeks postpartum following a CS-VAC. I have fully reviewed the prenatal and intrapartum course. The delivery was at 64 gestational weeks. Outcome: CS-VAC. Anesthesia: spinal. Postpartum course has been still having pain at incision site. Denies drainage/bleeding from site. Baby's course has been unremarkable. Baby is feeding by Neosure Formula Bleeding started menses on Sunday - still bleeding. . Bowel function is normal. Bladder function is normal. Patient is not sexually active. Contraception method is tubal ligation. Postpartum depression screening: negative.EPDS= 0  The following portions of the patient's history were reviewed and updated as appropriate: allergies, current medications, past family history, past medical history, past social history, past surgical history and problem list.  Review of Systems Pertinent items are noted in HPI.   Objective:  There were no vitals filed  for this visit. Self-Obtained       Assessment:    Normal postpartum exam. Pap smear not done at today's visit. Last pap smear 12/06/2018 and results were negative.  Plan:    1. Contraception: tubal ligation 2. Having difficulty with patch - will switch back to oral tabs. Continue nifedipine and labetalol 3. Referred to IM for PCP 4. Follow up in: 2 week for incision check or as needed.   Truett Mainland, DO 05/20/2019 10:10 AM

## 2019-05-23 ENCOUNTER — Other Ambulatory Visit: Payer: Self-pay

## 2019-05-23 NOTE — Progress Notes (Signed)
rx nifedipine refilled, Dr. Nehemiah Settle advised pt continue this medication on 05/20/19 visit.

## 2019-06-08 ENCOUNTER — Ambulatory Visit: Payer: Medicaid Other | Admitting: Obstetrics and Gynecology

## 2019-06-08 ENCOUNTER — Other Ambulatory Visit: Payer: Self-pay

## 2019-06-08 ENCOUNTER — Encounter: Payer: Self-pay | Admitting: Obstetrics and Gynecology

## 2019-06-08 VITALS — BP 148/78 | HR 103 | Ht 60.0 in | Wt 352.8 lb

## 2019-06-08 NOTE — Progress Notes (Signed)
Patient left prior to being seen by provider

## 2019-06-21 ENCOUNTER — Ambulatory Visit: Payer: Medicaid Other | Admitting: Obstetrics & Gynecology

## 2019-06-21 ENCOUNTER — Ambulatory Visit: Payer: Medicaid Other

## 2019-12-29 ENCOUNTER — Other Ambulatory Visit: Payer: Self-pay

## 2019-12-29 ENCOUNTER — Encounter: Payer: Self-pay | Admitting: Family Medicine

## 2019-12-29 ENCOUNTER — Ambulatory Visit (INDEPENDENT_AMBULATORY_CARE_PROVIDER_SITE_OTHER): Payer: Medicaid Other | Admitting: Family Medicine

## 2019-12-29 VITALS — BP 150/72 | HR 82 | Ht 60.0 in | Wt 347.0 lb

## 2019-12-29 DIAGNOSIS — I10 Essential (primary) hypertension: Secondary | ICD-10-CM

## 2019-12-29 DIAGNOSIS — R7303 Prediabetes: Secondary | ICD-10-CM | POA: Diagnosis not present

## 2019-12-29 DIAGNOSIS — Z6841 Body Mass Index (BMI) 40.0 and over, adult: Secondary | ICD-10-CM | POA: Diagnosis present

## 2019-12-29 LAB — POCT GLYCOSYLATED HEMOGLOBIN (HGB A1C): HbA1c, POC (controlled diabetic range): 6 % (ref 0.0–7.0)

## 2019-12-29 MED ORDER — AMLODIPINE BESYLATE 5 MG PO TABS: 5.0000 mg | ORAL_TABLET | Freq: Every day | ORAL | 3 refills | Status: DC

## 2019-12-29 MED ORDER — CLONIDINE HCL 0.1 MG PO TABS: 0.1000 mg | ORAL_TABLET | Freq: Two times a day (BID) | ORAL | 3 refills | Status: DC

## 2019-12-29 NOTE — Progress Notes (Signed)
    SUBJECTIVE:   CHIEF COMPLAINT / HPI:  Ms. Nowlen is a 34 yo female that presents to establish care as a new patient. She was last seen by multiple providers at [facility].   HTN History of hypertension prior to pregnancy and preeclampsia with both pregnancies requiring emergency c-section x2. She does have a blood pressure machine at home but does not measure it regularly. She does not have vision changes, headaches, or dizziness. She does endorse drowsiness when taking the labetalol. She also endorse intermittent chest pain 1-2x/wk after playing with her daughter that is more pronounced in the center. She takes her medications everyday but does not take them at the prescription times.   Morbid Obesity Voiced understanding that her weight is contributory to her health problems and wants to lose weight because of this.   Gestational diabetes Recent pregnancy last year with diet controlled diabetes.  PERTINENT  PMH / PSH: BTL, HTN, T2GDM, asthma, obesity  OBJECTIVE:   BP (!) 150/72   Pulse 82   Ht 5' (1.524 m)   Wt (!) 347 lb (157.4 kg)   LMP 12/15/2019   SpO2 97%   Breastfeeding No   BMI 67.77 kg/m    General: Appears well, no acute distress. Age appropriate. Morbidly obese. Cardiac: RRR, normal heart sounds, no murmurs Respiratory: CTAB, normal effort  ASSESSMENT/PLAN:   Essential hypertension Not well controlled. Prior medication regimen due to pregnancy. Cr 1.37, GFR 50. LDL 112, triglycerides 194.  -Stop labetalol and nifedipine -Start Clonidine taper 0.1mg  twice daily for 1 week, then take 0.1mg  once daily for 1 week, then stop the medication all together  -Start amlodipine 5 mg daily -Keep BP log -Return in 2 weeks for a follow up and BP check  Prediabetes A1c 6.0. LDL 112 triglycerides 194 -Discussed healthy eating habits (plate portion of 1/2 vegetable plate, 1/4 lean protein, 1/4 high fiber carbs) -Discussed the need for cardio exercises with sustaining an  increased heart rate for 20-30 mins for a starting goal of 3x week  Morbid obesity ([place]) BMI 68.  -Discussed healthy eating habits (plate portion of 1/2 vegetable plate, 1/4 lean protein, 1/4 high fiber carbs) -Discussed the need for cardio exercises with sustaining an increased heart rate for 20-30 mins for a starting goal of 3x week -Consider nutrition consult   Gerlene Fee, [place]

## 2019-12-29 NOTE — Patient Instructions (Signed)
It was very nice to meet you today. Please enjoy the rest of your week.  1. Continue clonidine 0.1mg  twice daily for 1 week, then take 0.1mg  once daily for 1 week, then stop the medication all together. 2. Start Amlodipine 5 mg daily 3. Log daily blood pressures for 2 weeks; bring log to next visit Please call the clinic at [phone] if your symptoms worsen or you have any concerns. It was our pleasure to serve you.

## 2019-12-30 DIAGNOSIS — E66813 Obesity, class 3: Secondary | ICD-10-CM | POA: Insufficient documentation

## 2019-12-30 DIAGNOSIS — R7303 Prediabetes: Secondary | ICD-10-CM | POA: Insufficient documentation

## 2019-12-30 DIAGNOSIS — I10 Essential (primary) hypertension: Secondary | ICD-10-CM | POA: Insufficient documentation

## 2019-12-30 LAB — LIPID PANEL
Chol/HDL Ratio: 4.2 ratio (ref 0.0–4.4)
Cholesterol, Total: 192 mg/dL (ref 100–199)
HDL: 46 mg/dL (ref >39)
LDL Chol Calc (NIH): 112 mg/dL — ABNORMAL HIGH (ref 0–99)
Triglycerides: 194 mg/dL — ABNORMAL HIGH (ref 0–149)
VLDL Cholesterol Cal: 34 mg/dL (ref 5–40)

## 2019-12-30 LAB — BASIC METABOLIC PANEL
BUN/Creatinine Ratio: 14 (ref 9–23)
BUN: 19 mg/dL (ref 6–20)
CO2: 24 mmol/L (ref 20–29)
Calcium: 9.8 mg/dL (ref 8.7–10.2)
Chloride: 104 mmol/L (ref 96–106)
Creatinine, Ser: 1.37 mg/dL — ABNORMAL HIGH (ref 0.57–1.00)
GFR calc Af Amer: 58 mL/min/{1.73_m2} — ABNORMAL LOW (ref >59)
GFR calc non Af Amer: 50 mL/min/{1.73_m2} — ABNORMAL LOW (ref >59)
Glucose: 86 mg/dL (ref 65–99)
Potassium: 4.2 mmol/L (ref 3.5–5.2)
Sodium: 142 mmol/L (ref 134–144)

## 2020-01-01 NOTE — Assessment & Plan Note (Signed)
A1c 6.0. LDL 112 triglycerides 194 -Discussed healthy eating habits (plate portion of 1/2 vegetable plate, 1/4 lean protein, 1/4 high fiber carbs) -Discussed the need for cardio exercises with sustaining an increased heart rate for 20-30 mins for a starting goal of 3x week

## 2020-01-01 NOTE — Assessment & Plan Note (Signed)
BMI 68.  -Discussed healthy eating habits (plate portion of 1/2 vegetable plate, 1/4 lean protein, 1/4 high fiber carbs) -Discussed the need for cardio exercises with sustaining an increased heart rate for 20-30 mins for a starting goal of 3x week -Consider nutrition consult

## 2020-01-01 NOTE — Assessment & Plan Note (Signed)
Not well controlled. Prior medication regimen due to pregnancy. Cr 1.37, GFR 50. LDL 112, triglycerides 194.  -Stop labetalol and nifedipine -Start Clonidine taper 0.1mg  twice daily for 1 week, then take 0.1mg  once daily for 1 week, then stop the medication all together  -Start amlodipine 5 mg daily -Keep BP log -Return in 2 weeks for a follow up and BP check

## 2020-01-13 ENCOUNTER — Encounter: Payer: Self-pay | Admitting: Family Medicine

## 2020-01-13 ENCOUNTER — Ambulatory Visit (INDEPENDENT_AMBULATORY_CARE_PROVIDER_SITE_OTHER): Payer: Medicaid Other | Admitting: Family Medicine

## 2020-01-13 ENCOUNTER — Other Ambulatory Visit: Payer: Self-pay

## 2020-01-13 VITALS — BP 144/78 | HR 101 | Wt 347.2 lb

## 2020-01-13 DIAGNOSIS — M25531 Pain in right wrist: Secondary | ICD-10-CM

## 2020-01-13 DIAGNOSIS — M25532 Pain in left wrist: Secondary | ICD-10-CM

## 2020-01-13 DIAGNOSIS — I10 Essential (primary) hypertension: Secondary | ICD-10-CM | POA: Diagnosis present

## 2020-01-13 MED ORDER — LISINOPRIL 10 MG PO TABS: 10.0000 mg | ORAL_TABLET | Freq: Every day | ORAL | 3 refills | Status: DC

## 2020-01-13 NOTE — Patient Instructions (Signed)
It was very nice to meet you today. Please start lisinopril 10mg  and continue amlodipine 5mg . Come in 2 weeks to check kidney function.  Please call the clinic at [phone] if you have any concerns. It was our pleasure to serve you.

## 2020-01-17 DIAGNOSIS — R202 Paresthesia of skin: Secondary | ICD-10-CM | POA: Insufficient documentation

## 2020-01-17 NOTE — Assessment & Plan Note (Addendum)
Advised to workout w/o mask on. -Continue amlodipine 5 mg daily -Start lisinopril 10mg  daily -Follow up for a BMET in 2 weeks

## 2020-01-17 NOTE — Assessment & Plan Note (Addendum)
Non-traumatic. Likely due to overuse. -Use heat before ADLs and ice at night for now and rest wrists when possible. -Would advise against NSAID use at this time with adding an ACE to her regimen and a creatinine of 1.3

## 2020-01-17 NOTE — Progress Notes (Addendum)
    SUBJECTIVE:   CHIEF COMPLAINT / HPI:   [name] is a 34 yo F presenting today for a follow up visit  HTN She has kept a home BP log and her average is 150/70s. She endorses compliance with amlodipine and has completed her clonidine taper. She no longer endorses chest pain but has occasional shortness of breath when working out with her mask on. She does not endorse headache or vision changes.  Bilateral wrist pain Continues to have intermittent bilateral wrist discomfort. 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 She has a history of carpel tunnel syndrome and believes it could be flaring due to helping her mother.   PERTINENT  PMH / PSH: Chronic renal insufficiency, prediabetes, morbid obesity  OBJECTIVE:   BP (!) 144/78   Pulse (!) 101   Wt (!) 347 lb 3.2 oz (157.5 kg)   LMP 12/15/2019   SpO2 96%   BMI 67.81 kg/m   General: Appears well, no acute distress. Age appropriate. Cardiac: RRR, normal heart sounds, no murmurs Respiratory: CTAB, normal effort  ASSESSMENT/PLAN:   Bilateral wrist pain Non-traumatic. Likely due to overuse. -Use heat before ADLs and ice at night for now and rest wrists when possible. -Would advise against NSAID use at this time with adding an ACE to her regimen and a creatinine of 1.3  Essential hypertension Advised to workout w/o mask on. -Continue amlodipine 5 mg daily -Start lisinopril 10mg  daily -Follow up for a BMET in 2 weeks   Gerlene Fee, [place]

## 2020-01-26 ENCOUNTER — Telehealth: Payer: Self-pay | Admitting: Family Medicine

## 2020-01-26 DIAGNOSIS — I10 Essential (primary) hypertension: Secondary | ICD-10-CM

## 2020-01-26 NOTE — Telephone Encounter (Signed)
Spoke with patient about lab appt tomorrow at 4pm for a BMP she voiced understanding. Order placed during this encounter.  Dr. Janus Molder, DO

## 2020-01-27 ENCOUNTER — Other Ambulatory Visit: Payer: Medicaid Other

## 2020-01-30 ENCOUNTER — Other Ambulatory Visit: Payer: Medicaid Other

## 2020-01-30 ENCOUNTER — Other Ambulatory Visit: Payer: Self-pay

## 2020-01-30 DIAGNOSIS — I10 Essential (primary) hypertension: Secondary | ICD-10-CM

## 2020-01-31 LAB — BASIC METABOLIC PANEL
BUN/Creatinine Ratio: 14 (ref 9–23)
BUN: 19 mg/dL (ref 6–20)
CO2: 26 mmol/L (ref 20–29)
Calcium: 9.3 mg/dL (ref 8.7–10.2)
Chloride: 104 mmol/L (ref 96–106)
Creatinine, Ser: 1.39 mg/dL — ABNORMAL HIGH (ref 0.57–1.00)
GFR calc Af Amer: 57 mL/min/{1.73_m2} — ABNORMAL LOW (ref >59)
GFR calc non Af Amer: 49 mL/min/{1.73_m2} — ABNORMAL LOW (ref >59)
Glucose: 90 mg/dL (ref 65–99)
Potassium: 4.4 mmol/L (ref 3.5–5.2)
Sodium: 143 mmol/L (ref 134–144)

## 2020-02-01 ENCOUNTER — Other Ambulatory Visit: Payer: Self-pay | Admitting: Family Medicine

## 2020-02-20 ENCOUNTER — Ambulatory Visit (INDEPENDENT_AMBULATORY_CARE_PROVIDER_SITE_OTHER): Payer: Medicaid Other | Admitting: Family Medicine

## 2020-02-20 ENCOUNTER — Other Ambulatory Visit: Payer: Self-pay

## 2020-02-20 VITALS — BP 175/90 | HR 93 | Ht 60.0 in | Wt 351.0 lb

## 2020-02-20 DIAGNOSIS — I10 Essential (primary) hypertension: Secondary | ICD-10-CM

## 2020-02-20 DIAGNOSIS — R202 Paresthesia of skin: Secondary | ICD-10-CM | POA: Diagnosis not present

## 2020-02-20 MED ORDER — LOSARTAN POTASSIUM 50 MG PO TABS: 50.0000 mg | ORAL_TABLET | Freq: Every day | ORAL | 3 refills | Status: DC

## 2020-02-20 MED ORDER — BIOFREEZE 4 % EX GEL: 1.0000 | Freq: Two times a day (BID) | CUTANEOUS | 0 refills | Status: DC | PRN

## 2020-02-20 NOTE — Patient Instructions (Addendum)
It was very nice to meet you today. Please enjoy the rest of your week.   Your BP goal is <140/90. Start losartan 50mg  daily. Keep a log of your blood pressure.  Start biofreeze for you wrist and arm. Keep a log of symptoms. If you have any lost of sensation or weakness call the office or go to the ED.  Please call the clinic at [phone] if your symptoms worsen or you have any concerns. It was our pleasure to serve you.

## 2020-02-20 NOTE — Progress Notes (Signed)
    SUBJECTIVE:   CHIEF COMPLAINT / HPI:   HTN Ms. Doby presents today to get the results of her recent lab work that was looking at her kidney function.  She did not get the results that were sent to my chart because she do not work or has to login.  She has been taking amlodipine and lisinopril regularly and states that her blood pressure at home has averaged 150/70-80.  She denies headache, changes in vision, shortness of breath, and chest pain.  Right Wrist Paresthesia  Continues to endorse right-sided tingling in hand and now into her elbow.  She also states it feels like it migrates down the right side into her leg.  It is intermittent and varies in severity.  Has used hot water which was helpful and Biofreeze in the past that worked really well.  Would like to use NSAIDs but understand that we are avoiding NSAIDs due to her decreased kidney function. Denies loss of sensation.  PERTINENT  PMH / PSH: Prediabetes, chronic renal insufficiency, morbid obesity  OBJECTIVE:   BP (!) 175/90   Pulse 93   Ht 5' (1.524 m)   Wt (!) 351 lb (159.2 kg)   SpO2 96%   BMI 68.55 kg/m    General: Appears well, no acute distress. Age appropriate. Non-antalgic gait Cardiac: RRR, normal heart sounds, no murmurs Respiratory: CTAB, normal effort Extremities: Moves all limbs smoothly and spontaneously. Displays full extension and flexion at right elbow. Normal ability to open and close right hand.  ASSESSMENT/PLAN:   Essential hypertension Not well controlled JNC 8 goal BP <140/90 not met at this visit. Cr slightly elevated from last due to starting lisinopril but not greater than 30%. Could consider SGLT2 inhibitor for renal protection. -Continue amlodipine 5 mg daily -Discontinue lisinopril 10 mg daily -Start losartan '50mg'$  daily -Keep BP log -Follow up in 2 weeks for BP recheck  Paresthesia of right upper extremity Chronic. Would like to get to the bottom of this. Will likely need a separate  visit for further work up. Low suspicion that this is acutely urgent such as MI or stroke. Will consider carpel tunnel syndrome, deQuervain's synovitis, or some other nerve impingement. Not sure if this correlates with the report extension down into the leg. Need more information about this condition to understand the route cause.  -Patient asked to keep a diary -Will trial biofreeze  -Follow up in 2 weeks -Call the office or go to ED if lost of sensation or weakness to be seen earlier   Gerlene Fee, [place]

## 2020-02-20 NOTE — Assessment & Plan Note (Addendum)
Not well controlled JNC 8 goal BP <140/90 not met at this visit. Cr slightly elevated from last due to starting lisinopril but not greater than 30%. Could consider SGLT2 inhibitor for renal protection. -Continue amlodipine 5 mg daily -Discontinue lisinopril 10 mg daily -Start losartan 67m daily -Keep BP log -Follow up in 2 weeks for BP recheck

## 2020-02-20 NOTE — Assessment & Plan Note (Addendum)
Chronic. Would like to get to the bottom of this. Will likely need a separate visit for further work up. Low suspicion that this is acutely urgent such as MI or stroke. Will consider carpel tunnel syndrome, deQuervain's synovitis, or some other nerve impingement. Not sure if this correlates with the report extension down into the leg. Need more information about this condition to understand the route cause.  -Patient asked to keep a diary -Will trial biofreeze  -Follow up in 2 weeks -Call the office or go to ED if lost of sensation or weakness to be seen earlier

## 2021-02-12 ENCOUNTER — Other Ambulatory Visit: Payer: Self-pay

## 2021-02-12 ENCOUNTER — Emergency Department (HOSPITAL_COMMUNITY): Payer: Medicaid Other

## 2021-02-12 ENCOUNTER — Observation Stay (HOSPITAL_COMMUNITY)
Admission: EM | Admit: 2021-02-12 | Discharge: 2021-02-14 | Disposition: A | Discharge status: Home / Self Care | Payer: Medicaid Other | Attending: Family Medicine | Admitting: Family Medicine

## 2021-02-12 DIAGNOSIS — E876 Hypokalemia: Secondary | ICD-10-CM | POA: Diagnosis not present

## 2021-02-12 DIAGNOSIS — I10 Essential (primary) hypertension: Secondary | ICD-10-CM

## 2021-02-12 DIAGNOSIS — J810 Acute pulmonary edema: Secondary | ICD-10-CM

## 2021-02-12 DIAGNOSIS — J45909 Unspecified asthma, uncomplicated: Secondary | ICD-10-CM | POA: Insufficient documentation

## 2021-02-12 DIAGNOSIS — I160 Hypertensive urgency: Secondary | ICD-10-CM | POA: Diagnosis present

## 2021-02-12 DIAGNOSIS — I509 Heart failure, unspecified: Secondary | ICD-10-CM

## 2021-02-12 DIAGNOSIS — I5032 Chronic diastolic (congestive) heart failure: Secondary | ICD-10-CM | POA: Insufficient documentation

## 2021-02-12 DIAGNOSIS — I130 Hypertensive heart and chronic kidney disease with heart failure and stage 1 through stage 4 chronic kidney disease, or unspecified chronic kidney disease: Secondary | ICD-10-CM | POA: Diagnosis not present

## 2021-02-12 DIAGNOSIS — Z79899 Other long term (current) drug therapy: Secondary | ICD-10-CM | POA: Diagnosis not present

## 2021-02-12 DIAGNOSIS — R0602 Shortness of breath: Secondary | ICD-10-CM | POA: Diagnosis present

## 2021-02-12 DIAGNOSIS — N1831 Chronic kidney disease, stage 3a: Secondary | ICD-10-CM | POA: Insufficient documentation

## 2021-02-12 DIAGNOSIS — I161 Hypertensive emergency: Secondary | ICD-10-CM

## 2021-02-12 DIAGNOSIS — Z20822 Contact with and (suspected) exposure to covid-19: Secondary | ICD-10-CM | POA: Insufficient documentation

## 2021-02-12 LAB — URINALYSIS, ROUTINE W REFLEX MICROSCOPIC
Bilirubin Urine: NEGATIVE
Glucose, UA: NEGATIVE mg/dL
Ketones, ur: NEGATIVE mg/dL
Leukocytes,Ua: NEGATIVE
Nitrite: NEGATIVE
Protein, ur: 30 mg/dL — AB
Specific Gravity, Urine: 1.008 (ref 1.005–1.030)
pH: 6 (ref 5.0–8.0)

## 2021-02-12 LAB — CBC WITH DIFFERENTIAL/PLATELET
Abs Immature Granulocytes: 0.03 10*3/uL (ref 0.00–0.07)
Basophils Absolute: 0.1 10*3/uL (ref 0.0–0.1)
Basophils Relative: 1 %
Eosinophils Absolute: 0.2 10*3/uL (ref 0.0–0.5)
Eosinophils Relative: 2 %
HCT: 42.4 % (ref 36.0–46.0)
Hemoglobin: 13.2 g/dL (ref 12.0–15.0)
Immature Granulocytes: 0 %
Lymphocytes Relative: 19 %
Lymphs Abs: 1.5 10*3/uL (ref 0.7–4.0)
MCH: 26 pg (ref 26.0–34.0)
MCHC: 31.1 g/dL (ref 30.0–36.0)
MCV: 83.6 fL (ref 80.0–100.0)
Monocytes Absolute: 0.6 10*3/uL (ref 0.1–1.0)
Monocytes Relative: 8 %
Neutro Abs: 5.5 10*3/uL (ref 1.7–7.7)
Neutrophils Relative %: 70 %
Platelets: 252 10*3/uL (ref 150–400)
RBC: 5.07 MIL/uL (ref 3.87–5.11)
RDW: 15.1 % (ref 11.5–15.5)
WBC: 7.8 10*3/uL (ref 4.0–10.5)
nRBC: 0 % (ref 0.0–0.2)

## 2021-02-12 LAB — RAPID URINE DRUG SCREEN, HOSP PERFORMED
Amphetamines: NOT DETECTED
Barbiturates: NOT DETECTED
Benzodiazepines: NOT DETECTED
Cocaine: NOT DETECTED
Opiates: NOT DETECTED
Tetrahydrocannabinol: NOT DETECTED

## 2021-02-12 LAB — BASIC METABOLIC PANEL
Anion gap: 8 (ref 5–15)
BUN: 14 mg/dL (ref 6–20)
CO2: 27 mmol/L (ref 22–32)
Calcium: 8.8 mg/dL — ABNORMAL LOW (ref 8.9–10.3)
Chloride: 102 mmol/L (ref 98–111)
Creatinine, Ser: 1.11 mg/dL — ABNORMAL HIGH (ref 0.44–1.00)
GFR, Estimated: >60 mL/min (ref >60)
Glucose, Bld: 104 mg/dL — ABNORMAL HIGH (ref 70–99)
Potassium: 3.5 mmol/L (ref 3.5–5.1)
Sodium: 137 mmol/L (ref 135–145)

## 2021-02-12 LAB — RESP PANEL BY RT-PCR (FLU A&B, COVID) ARPGX2
Influenza A by PCR: NEGATIVE
Influenza B by PCR: NEGATIVE
SARS Coronavirus 2 by RT PCR: NEGATIVE

## 2021-02-12 LAB — HIV ANTIBODY (ROUTINE TESTING W REFLEX): HIV Screen 4th Generation wRfx: NONREACTIVE

## 2021-02-12 LAB — TSH: TSH: 1.655 u[IU]/mL (ref 0.350–4.500)

## 2021-02-12 LAB — I-STAT BETA HCG BLOOD, ED (MC, WL, AP ONLY): I-stat hCG, quantitative: <5 m[IU]/mL (ref <5)

## 2021-02-12 LAB — BRAIN NATRIURETIC PEPTIDE: B Natriuretic Peptide: 386.3 pg/mL — ABNORMAL HIGH (ref 0.0–100.0)

## 2021-02-12 MED ORDER — NITROGLYCERIN IN D5W 200-5 MCG/ML-% IV SOLN
50.0000 ug/min | INTRAVENOUS | Status: DC
  Administered 2021-02-12: 50 ug/min via INTRAVENOUS
  Filled 2021-02-12: qty 250

## 2021-02-12 MED ORDER — LABETALOL HCL 5 MG/ML IV SOLN
10.0000 mg | INTRAVENOUS | Status: DC | PRN
  Administered 2021-02-12 – 2021-02-13 (×4): 10 mg via INTRAVENOUS
  Filled 2021-02-12 (×5): qty 4

## 2021-02-12 MED ORDER — ONDANSETRON HCL 4 MG/2ML IJ SOLN: 4.0000 mg | Freq: Four times a day (QID) | INTRAMUSCULAR | Status: DC | PRN

## 2021-02-12 MED ORDER — LOSARTAN POTASSIUM 50 MG PO TABS
50.0000 mg | ORAL_TABLET | Freq: Every day | ORAL | Status: DC
  Administered 2021-02-12 – 2021-02-13 (×2): 50 mg via ORAL
  Filled 2021-02-12 (×2): qty 1

## 2021-02-12 MED ORDER — FUROSEMIDE 10 MG/ML IJ SOLN
40.0000 mg | Freq: Once | INTRAMUSCULAR | Status: AC
  Administered 2021-02-12: 40 mg via INTRAVENOUS
  Filled 2021-02-12 (×3): qty 4

## 2021-02-12 MED ORDER — HYDRALAZINE HCL 20 MG/ML IJ SOLN
10.0000 mg | Freq: Once | INTRAMUSCULAR | Status: AC
  Administered 2021-02-12: 10 mg via INTRAVENOUS
  Filled 2021-02-12: qty 1

## 2021-02-12 MED ORDER — ENOXAPARIN SODIUM 80 MG/0.8ML ~~LOC~~ SOLN
80.0000 mg | SUBCUTANEOUS | Status: DC
  Filled 2021-02-12: qty 0.8

## 2021-02-12 MED ORDER — HYDROCHLOROTHIAZIDE 12.5 MG PO CAPS
12.5000 mg | ORAL_CAPSULE | Freq: Every day | ORAL | Status: DC
  Administered 2021-02-12 – 2021-02-13 (×2): 12.5 mg via ORAL
  Filled 2021-02-12 (×2): qty 1

## 2021-02-12 MED ORDER — ENOXAPARIN SODIUM 40 MG/0.4ML ~~LOC~~ SOLN
40.0000 mg | SUBCUTANEOUS | Status: DC
  Administered 2021-02-12: 40 mg via SUBCUTANEOUS
  Filled 2021-02-12: qty 0.4

## 2021-02-12 MED ORDER — ACETAMINOPHEN 325 MG PO TABS: 650.0000 mg | ORAL_TABLET | ORAL | Status: DC | PRN

## 2021-02-12 MED ORDER — SODIUM CHLORIDE 0.9% FLUSH: 3.0000 mL | INTRAVENOUS | Status: DC | PRN

## 2021-02-12 MED ORDER — SODIUM CHLORIDE 0.9 % IV SOLN: 250.0000 mL | INTRAVENOUS | Status: DC | PRN

## 2021-02-12 MED ORDER — SODIUM CHLORIDE 0.9% FLUSH
3.0000 mL | Freq: Two times a day (BID) | INTRAVENOUS | Status: DC
  Administered 2021-02-12 – 2021-02-13 (×3): 3 mL via INTRAVENOUS

## 2021-02-12 MED ORDER — CLONIDINE HCL 0.2 MG PO TABS
0.2000 mg | ORAL_TABLET | Freq: Once | ORAL | Status: AC
  Administered 2021-02-12: 0.2 mg via ORAL
  Filled 2021-02-12: qty 1

## 2021-02-12 MED ORDER — AMLODIPINE BESYLATE 5 MG PO TABS
5.0000 mg | ORAL_TABLET | Freq: Every day | ORAL | Status: DC
  Administered 2021-02-13: 5 mg via ORAL
  Filled 2021-02-12: qty 1

## 2021-02-12 NOTE — Plan of Care (Signed)

## 2021-02-12 NOTE — ED Provider Notes (Addendum)
[place] EMERGENCY DEPARTMENT Provider Note   CSN: [id number] Arrival date & time: 02/12/21  6720     History Chief Complaint  Patient presents with  . Shortness of Breath    Danielle Kent is a 35 y.o. female.  HPI     35 year old female comes in with chief complaint of shortness of breath. Pt has hx of HTN. She reports that she should be on clonidine and either labetalol/lisinopril right now.   Over the last 2 days she has been having worsening shortness of breath.  Shortness of breath is exertional, she is able to do minimal tasks at home without getting short of breath.  She is having orthopnea, PND-like symptoms and was unable to sleep well.  Denies any significant leg swelling.  She informs me that she has missed doses of her antihypertensive here and there.  Review of system is negative for chest pain, cough, fevers, chills Pt has no hx of PE, DVT and denies any exogenous hormone (testosterone / estrogen) use, long distance travels or surgery in the past 6 weeks, active cancer, recent immobilization.   Past Medical History:  Diagnosis Date  . Asthma   . Gestational diabetes 2020  . Hypertension     Patient Active Problem List   Diagnosis Date Noted  . Paresthesia of right upper extremity 01/17/2020  . Essential hypertension 12/30/2019  . Prediabetes 12/30/2019  . Morbid obesity ([place]) 12/30/2019  . BMI 70 and over, adult ([place]) 03/15/2019  . Chronic renal insufficiency 03/15/2019  . Severe hypertension   . Chronic hypertension with superimposed severe preeclampsia 02/23/2019  . Preexisting diabetes complicating pregnancy, antepartum 12/27/2018  . Chronic hypertension during pregnancy, antepartum 12/06/2018  . Maternal morbid obesity, antepartum ([place]) 12/06/2018    Past Surgical History:  Procedure Laterality Date  . CESAREAN SECTION    . CESAREAN SECTION WITH BILATERAL TUBAL LIGATION N/A 04/03/2019   Procedure: CESAREAN SECTION WITH  BILATERAL TUBAL LIGATION;  Surgeon: Florian Buff, MD;  Location: [facility];  Service: Obstetrics;  Laterality: N/A;     OB History    Gravida  2   Para  2   Term  1   Preterm  1   AB      Living  2     SAB      IAB      Ectopic      Multiple  0   Live Births  2           Family History  Problem Relation Age of Onset  . Diabetes Mother   . Hypertension Mother   . Multiple sclerosis Mother   . Asthma Father   . Heart attack Maternal Grandfather 57    Social History   Tobacco Use  . Smoking status: Never Smoker  . Smokeless tobacco: Never Used  Vaping Use  . Vaping Use: Never used  Substance Use Topics  . Alcohol use: Not Currently  . Drug use: Not Currently    Home Medications Prior to Admission medications   Medication Sig Start Date End Date Taking? Authorizing Provider  amLODipine (NORVASC) 5 MG tablet Take 1 tablet (5 mg total) by mouth at bedtime. 12/29/19   Autry-Lott, Naaman Plummer, DO  losartan (COZAAR) 50 MG tablet Take 1 tablet (50 mg total) by mouth at bedtime. 02/20/20   Autry-Lott, Naaman Plummer, DO  Menthol, Topical Analgesic, (BIOFREEZE) 4 % GEL Apply 1 Tube topically 2 (two) times daily as needed (apply morning and night).  02/20/20   Autry-Lott, Naaman Plummer, DO    Allergies    Patient has no known allergies.  Review of Systems   Review of Systems  Constitutional: Positive for activity change.  Respiratory: Positive for shortness of breath.   Cardiovascular: Negative for chest pain.  Gastrointestinal: Negative for nausea and vomiting.  Neurological: Negative for dizziness, weakness, numbness and headaches.  All other systems reviewed and are negative.   Physical Exam Updated Vital Signs BP (!) 234/120 (BP Location: Left Arm)   Pulse (!) 104   Temp 99 F (37.2 C) (Oral)   Resp 20   Ht 5' (1.524 m)   Wt (!) 163.3 kg   LMP 01/14/2021   SpO2 100%   BMI 70.31 kg/m   Physical Exam Vitals and nursing note reviewed.  Constitutional:       Appearance: She is well-developed.  HENT:     Head: Normocephalic and atraumatic.  Cardiovascular:     Rate and Rhythm: Normal rate.  Pulmonary:     Effort: Pulmonary effort is normal.     Breath sounds: No decreased breath sounds, wheezing or rales.  Abdominal:     General: Bowel sounds are normal.  Musculoskeletal:     Cervical back: Normal range of motion and neck supple.     Comments: Trace bilateral lower extremity edema  Skin:    General: Skin is warm and dry.  Neurological:     Mental Status: She is alert and oriented to person, place, and time.     ED Results / Procedures / Treatments   Labs (all labs ordered are listed, but only abnormal results are displayed) Labs Reviewed  BASIC METABOLIC PANEL - Abnormal; Notable for the following components:      Result Value   Glucose, Bld 104 (*)    Creatinine, Ser 1.11 (*)    Calcium 8.8 (*)    All other components within normal limits  BRAIN NATRIURETIC PEPTIDE - Abnormal; Notable for the following components:   B Natriuretic Peptide 386.3 (*)    All other components within normal limits  RESP PANEL BY RT-PCR (FLU A&B, COVID) ARPGX2  CBC WITH DIFFERENTIAL/PLATELET  I-STAT BETA HCG BLOOD, ED ([facility], [facility], [facility] ONLY)    EKG EKG Interpretation  Date/Time:  Tuesday February 12 2021 10:10:29 EDT Ventricular Rate:  103 PR Interval:  169 QRS Duration: 78 QT Interval:  361 QTC Calculation: 473 R Axis:   -14 Text Interpretation: Sinus tachycardia No acute changes No significant change since last tracing Confirmed by Varney Biles ([id number]) on 02/12/2021 11:35:39 AM   Radiology DG Chest Port 1 View  Result Date: 02/12/2021 CLINICAL DATA:  Shortness of breath. Evaluation for pulmonary edema. EXAM: PORTABLE CHEST 1 VIEW COMPARISON:  Chest x-ray 11/25/2018. FINDINGS: Cardiomegaly. Mild bilateral interstitial prominence. Mild interstitial edema and/or pneumonitis cannot be excluded. No prominent pleural effusion. No pneumothorax.  IMPRESSION: Cardiomegaly. Mild bilateral interstitial prominence. Mild interstitial edema and/or pneumonitis cannot be excluded. Electronically Signed   By: Marcello Moores  Register   On: 02/12/2021 11:41    Procedures .Critical Care Performed by: Varney Biles, MD Authorized by: Varney Biles, MD   Critical care provider statement:    Critical care time (minutes):  42   Critical care was necessary to treat or prevent imminent or life-threatening deterioration of the following conditions:  Circulatory failure   Critical care was time spent personally by me on the following activities:  Discussions with consultants, evaluation of patient's response to treatment, examination of patient, ordering  and performing treatments and interventions, ordering and review of laboratory studies, ordering and review of radiographic studies, pulse oximetry, re-evaluation of patient's condition, obtaining history from patient or surrogate and review of old charts     Medications Ordered in ED Medications  nitroGLYCERIN 50 mg in dextrose 5 % 250 mL (0.2 mg/mL) infusion (50 mcg/min Intravenous New Bag/Given 02/12/21 1314)  hydrALAZINE (APRESOLINE) injection 10 mg (10 mg Intravenous Given 02/12/21 1115)  cloNIDine (CATAPRES) tablet 0.2 mg (0.2 mg Oral Given 02/12/21 1113)    ED Course  I have reviewed the triage vital signs and the nursing notes.  Pertinent labs & imaging results that were available during my care of the patient were reviewed by me and considered in my medical decision making (see chart for details).  Clinical Course as of 02/12/21 1329  Tue Feb 12, 2021  1328 BP(!): 234/120 Patient's BP remains high despite clonidine.  X-ray reviewed and reveals what appears to be pulmonary edema.  Nitro drip ordered.  Patient will need admission for hypertensive emergency and pulmonary edema.  She has been made aware of the diagnosis and the plan.  Nursing staff initiating nitro drip with a goal map of 110  mmhg [AN]    Clinical Course User Index [AN] Varney Biles, MD   MDM Rules/Calculators/A&P                          35 year old female with history of hypertension, morbid obesity comes in w/ chief complaint of shortness of breath.  On exam patient does not have clear volume overload, but history suggestive of CHF/pulmonary edema.  Exam limited due to body habitus.  Her MAP is 150 during my assessment.  Plan is to give her oral clonidine and oral hydralazine and get lab work-up.  1:29 PM Patient received her oral antihypertensives, but her BP is still.  Chest x-ray shows pulmonary edema.  BNP is mildly elevated.  We will treat this like hypertensive emergency and started on nitro drip at this time.  Patient will need admission to the hospital.  Final Clinical Impression(s) / ED Diagnoses Final diagnoses:  Hypertensive emergency  Acute pulmonary edema [facility])    Rx / DC Orders ED Discharge Orders    None       Varney Biles, MD 02/12/21 [place], [place], MD 02/12/21 1329

## 2021-02-12 NOTE — H&P (Signed)
History and Physical    Danielle Kent UKG:[id number] DOB: 26-Apr-1986 DOA: 02/12/2021  PCP: Gerlene Fee, DO (Confirm with patient/family/NH records and if not entered, this has to be entered at [facility] point of entry) Patient coming from: Home  I have personally briefly reviewed patient's old medical records in [place]  Chief Complaint: SOB  HPI: Danielle Kent is a 35 y.o. female with medical history significant of HTN, noncompliant with medications, morbid obesity, presented with increasing shortness of breath.    Symptoms started last Friday, initially with exertional dyspnea then gradually getting worse.  Developed orthopnea last 2 nights.  Patient told me that she has been taking her medication labetalol once a day before sleep " it makes me so drowsy" and she takes losartan in the morning.  And she has not been following with PCP for more than a year, she self checked her BP at home most occasions SBP> 180.  Before she has been taking other combinations including amlodipine and HCTZ which were switched to current regimen of labetalol and losartan over a year ago.  Denies any cough, no chest pain no fever or chills.  ED Course: SBP> 210, not responding to clonidine and labetalol IV push, nitroglycerin drip started, SBP dropped to 150s.  Chest x-ray showed bilateral pulmonary congestion.  Review of Systems: As per HPI otherwise 14 point review of systems negative.    Past Medical History:  Diagnosis Date  . Asthma   . Gestational diabetes 2020  . Hypertension     Past Surgical History:  Procedure Laterality Date  . CESAREAN SECTION    . CESAREAN SECTION WITH BILATERAL TUBAL LIGATION N/A 04/03/2019   Procedure: CESAREAN SECTION WITH BILATERAL TUBAL LIGATION;  Surgeon: Florian Buff, MD;  Location: [facility] LD ORS;  Service: Obstetrics;  Laterality: N/A;     reports that she has never smoked. She has never used smokeless tobacco. She reports previous alcohol use. She reports previous  drug use.  No Known Allergies  Family History  Problem Relation Age of Onset  . Diabetes Mother   . Hypertension Mother   . Multiple sclerosis Mother   . Asthma Father   . Heart attack Maternal Grandfather 57     Prior to Admission medications   Medication Sig Start Date End Date Taking? Authorizing Provider  labetalol (NORMODYNE) 200 MG tablet Take 200 mg by mouth 3 (three) times daily.   Yes [provider]  amLODipine (NORVASC) 5 MG tablet Take 1 tablet (5 mg total) by mouth at bedtime. Patient not taking: Reported on 02/12/2021 12/29/19   Autry-Lott, Naaman Plummer, DO  losartan (COZAAR) 50 MG tablet Take 1 tablet (50 mg total) by mouth at bedtime. Patient not taking: Reported on 02/12/2021 02/20/20   Autry-Lott, Naaman Plummer, DO  Menthol, Topical Analgesic, (BIOFREEZE) 4 % GEL Apply 1 Tube topically 2 (two) times daily as needed (apply morning and night). Patient not taking: Reported on 02/12/2021 02/20/20   Gerlene Fee, DO    Physical Exam: Vitals:   02/12/21 1230 02/12/21 1300 02/12/21 1330 02/12/21 1339  BP: (!) 234/120 (!) 222/101 (!) 181/87 (!) 159/83  Pulse: (!) 104 (!) 105 96 (!) 102  Resp: 20 (!) 23 (!) 23 20  Temp:      TempSrc:      SpO2: 100% 96% 94% 94%  Weight:      Height:        Constitutional: NAD, calm, comfortable Vitals:   02/12/21 1230 02/12/21 1300 02/12/21 1330 02/12/21  1339  BP: (!) 234/120 (!) 222/101 (!) 181/87 (!) 159/83  Pulse: (!) 104 (!) 105 96 (!) 102  Resp: 20 (!) 23 (!) 23 20  Temp:      TempSrc:      SpO2: 100% 96% 94% 94%  Weight:      Height:       Eyes: PERRL, lids and conjunctivae normal ENMT: Mucous membranes are moist. Posterior pharynx clear of any exudate or lesions.Normal dentition.  Neck: normal, supple, no masses, no thyromegaly Respiratory: clear to auscultation bilaterally, no wheezing, fine crackles on bilateral bases.  Increasing respiratory effort. No accessory muscle use.  Cardiovascular: Regular rate and rhythm,  no murmurs / rubs / gallops. No extremity edema. 2+ pedal pulses. No carotid bruits.  Abdomen: no tenderness, no masses palpated. No hepatosplenomegaly. Bowel sounds positive.  Musculoskeletal: no clubbing / cyanosis. No joint deformity upper and lower extremities. Good ROM, no contractures. Normal muscle tone.  Skin: no rashes, lesions, ulcers. No induration Neurologic: CN 2-12 grossly intact. Sensation intact, DTR normal. Strength 5/5 in all 4.  Psychiatric: Normal judgment and insight. Alert and oriented x 3. Normal mood.     Labs on Admission: I have personally reviewed following labs and imaging studies  CBC: Recent Labs  Lab 02/12/21 1049  WBC 7.8  NEUTROABS 5.5  HGB 13.2  HCT 42.4  MCV 83.6  PLT 650   Basic Metabolic Panel: Recent Labs  Lab 02/12/21 1049  NA 137  K 3.5  CL 102  CO2 27  GLUCOSE 104*  BUN 14  CREATININE 1.11*  CALCIUM 8.8*   GFR: Estimated Creatinine Clearance: 103.4 mL/min (A) (by C-G formula based on SCr of 1.11 mg/dL (H)). Liver Function Tests: No results for input(s): AST, ALT, ALKPHOS, BILITOT, PROT, ALBUMIN in the last 168 hours. No results for input(s): LIPASE, AMYLASE in the last 168 hours. No results for input(s): AMMONIA in the last 168 hours. Coagulation Profile: No results for input(s): INR, PROTIME in the last 168 hours. Cardiac Enzymes: No results for input(s): CKTOTAL, CKMB, CKMBINDEX, TROPONINI in the last 168 hours. BNP (last 3 results) No results for input(s): PROBNP in the last 8760 hours. HbA1C: No results for input(s): HGBA1C in the last 72 hours. CBG: No results for input(s): GLUCAP in the last 168 hours. Lipid Profile: No results for input(s): CHOL, HDL, LDLCALC, TRIG, CHOLHDL, LDLDIRECT in the last 72 hours. Thyroid Function Tests: No results for input(s): TSH, T4TOTAL, FREET4, T3FREE, THYROIDAB in the last 72 hours. Anemia Panel: No results for input(s): VITAMINB12, FOLATE, FERRITIN, TIBC, IRON, RETICCTPCT in the  last 72 hours. Urine analysis:    Component Value Date/Time   COLORURINE YELLOW 03/01/2019 1614   APPEARANCEUR CLOUDY (A) 03/01/2019 1614   LABSPEC 1.021 03/01/2019 1614   PHURINE 5.0 03/01/2019 1614   GLUCOSEU NEGATIVE 03/01/2019 1614   HGBUR NEGATIVE 03/01/2019 1614   BILIRUBINUR NEGATIVE 03/01/2019 1614   KETONESUR 5 (A) 03/01/2019 1614   PROTEINUR 30 (A) 03/01/2019 1614   NITRITE NEGATIVE 03/01/2019 1614   LEUKOCYTESUR NEGATIVE 03/01/2019 1614    Radiological Exams on Admission: DG Chest Port 1 View  Result Date: 02/12/2021 CLINICAL DATA:  Shortness of breath. Evaluation for pulmonary edema. EXAM: PORTABLE CHEST 1 VIEW COMPARISON:  Chest x-ray 11/25/2018. FINDINGS: Cardiomegaly. Mild bilateral interstitial prominence. Mild interstitial edema and/or pneumonitis cannot be excluded. No prominent pleural effusion. No pneumothorax. IMPRESSION: Cardiomegaly. Mild bilateral interstitial prominence. Mild interstitial edema and/or pneumonitis cannot be excluded. Electronically Signed   By:  [place]   On: 02/12/2021 11:41    EKG: Independently reviewed. Sinus tachy  Assessment/Plan Active Problems:   Acute CHF (congestive heart failure) (HCC)   HTN (hypertension), malignant  (please populate well all problems here in Problem List. (For example, if patient is on BP meds at home and you resume or decide to hold them, it is a problem that needs to be her. Same for CAD, COPD, HLD and so on)  HTN emergency -Blood pressure very sensitive to nitroglycerin drip, SBP dropped to 150s, will discontinue nitroglycerin drip.  Appears to tolerate p.o., will start p.o. BP meds including amlodipine, ARB, add HCTZ.  All can be titrated.  Anemia at last drop of 25% SBP by the end of the day to around 160s and aiming at normalization of BP tomorrow. -UDS  Acute diastolic CHF decompensation -Control blood pressure -1 dose of IV Lasix 40 mg -Outpatient echocardiogram  Morbid obesity -Follow-up  with PCP, calorie control.  DVT prophylaxis: Lovenox  code Status: Full code Family Communication: None at bedside Disposition Plan: Expect 1 to 2 days hospital stay to normalize BP. Consults called: None Admission status: PCU   Lequita Halt MD [facility] Pager [phone]  02/12/2021, 2:06 PM

## 2021-02-12 NOTE — ED Notes (Signed)
Pharmacy notified about nitro drip and parameters being met

## 2021-02-12 NOTE — ED Triage Notes (Signed)
Patient presents to the ED for shortness of breath that's been going on for 3 days.  Patient states shortness of breath increases with exertion and still has mild symptoms at rest.  History of asthma.

## 2021-02-13 ENCOUNTER — Encounter (HOSPITAL_COMMUNITY): Payer: Self-pay | Admitting: Internal Medicine

## 2021-02-13 DIAGNOSIS — I160 Hypertensive urgency: Secondary | ICD-10-CM | POA: Diagnosis not present

## 2021-02-13 DIAGNOSIS — I10 Essential (primary) hypertension: Secondary | ICD-10-CM

## 2021-02-13 LAB — BASIC METABOLIC PANEL
Anion gap: 10 (ref 5–15)
BUN: 17 mg/dL (ref 6–20)
CO2: 27 mmol/L (ref 22–32)
Calcium: 9 mg/dL (ref 8.9–10.3)
Chloride: 101 mmol/L (ref 98–111)
Creatinine, Ser: 1.4 mg/dL — ABNORMAL HIGH (ref 0.44–1.00)
GFR, Estimated: 50 mL/min — ABNORMAL LOW (ref >60)
Glucose, Bld: 96 mg/dL (ref 70–99)
Potassium: 3.3 mmol/L — ABNORMAL LOW (ref 3.5–5.1)
Sodium: 138 mmol/L (ref 135–145)

## 2021-02-13 MED ORDER — LOSARTAN POTASSIUM 50 MG PO TABS: 50.0000 mg | ORAL_TABLET | Freq: Every day | ORAL | 3 refills | Status: DC

## 2021-02-13 MED ORDER — HYDRALAZINE HCL 50 MG PO TABS
50.0000 mg | ORAL_TABLET | Freq: Four times a day (QID) | ORAL | Status: DC | PRN
  Administered 2021-02-14: 50 mg via ORAL
  Filled 2021-02-13: qty 1

## 2021-02-13 MED ORDER — AMLODIPINE BESYLATE 10 MG PO TABS
10.0000 mg | ORAL_TABLET | Freq: Every day | ORAL | Status: DC
  Administered 2021-02-13 – 2021-02-14 (×2): 10 mg via ORAL
  Filled 2021-02-13 (×2): qty 1

## 2021-02-13 MED ORDER — AMLODIPINE BESYLATE 10 MG PO TABS: 10.0000 mg | ORAL_TABLET | Freq: Every day | ORAL | 3 refills | Status: DC

## 2021-02-13 MED ORDER — LABETALOL HCL 200 MG PO TABS
200.0000 mg | ORAL_TABLET | Freq: Three times a day (TID) | ORAL | Status: DC
  Administered 2021-02-13 – 2021-02-14 (×3): 200 mg via ORAL
  Filled 2021-02-13 (×3): qty 1

## 2021-02-13 MED ORDER — HYDROCHLOROTHIAZIDE 12.5 MG PO CAPS: 12.5000 mg | ORAL_CAPSULE | Freq: Every day | ORAL | 3 refills | Status: DC

## 2021-02-13 MED ORDER — HYDROCHLOROTHIAZIDE 25 MG PO TABS
25.0000 mg | ORAL_TABLET | Freq: Every day | ORAL | Status: DC
  Administered 2021-02-14: 25 mg via ORAL
  Filled 2021-02-13: qty 1

## 2021-02-13 MED ORDER — POTASSIUM CHLORIDE CRYS ER 20 MEQ PO TBCR
40.0000 meq | EXTENDED_RELEASE_TABLET | Freq: Once | ORAL | Status: AC
  Administered 2021-02-13: 40 meq via ORAL
  Filled 2021-02-13: qty 2

## 2021-02-13 MED ORDER — HYDROCHLOROTHIAZIDE 12.5 MG PO CAPS
12.5000 mg | ORAL_CAPSULE | Freq: Once | ORAL | Status: AC
  Administered 2021-02-13: 12.5 mg via ORAL
  Filled 2021-02-13: qty 1

## 2021-02-13 MED ORDER — METOPROLOL TARTRATE 50 MG PO TABS: 50.0000 mg | ORAL_TABLET | Freq: Two times a day (BID) | ORAL | 2 refills | Status: DC

## 2021-02-13 MED ORDER — METOPROLOL TARTRATE 50 MG PO TABS
50.0000 mg | ORAL_TABLET | Freq: Two times a day (BID) | ORAL | Status: DC
  Administered 2021-02-13: 50 mg via ORAL
  Filled 2021-02-13: qty 1

## 2021-02-13 MED ORDER — AMLODIPINE BESYLATE 5 MG PO TABS: 5.0000 mg | ORAL_TABLET | Freq: Two times a day (BID) | ORAL | Status: DC

## 2021-02-13 NOTE — Care Management (Signed)
1511 02-13-21 Case Manager spoke with patient regarding disposition home. Patient has Medicaid and the MD on the card is from [facility]. Patient states she had been to the office 2 months ago to fill out the application and sign consents for an appointment. Case Manager called the office today and the office has nothing on file for this patient. Patient is aware to go the above office once she is d/c to fill out allotted paperwork; the appointments are now out until May. Patient has been to [facility] in the past; however, since [facility] is not on her Medicaid card- the patient will be paying out of pocket and she is not able to do so. Patient is able to get medications from [facility] and the cost is affordable. Patient is aware that she needs to get the paperwork signed and submitted to make an appointment. Case Manager will continue to follow for additional transition of care needs.  Danielle Kent

## 2021-02-13 NOTE — Discharge Summary (Addendum)
Physician Discharge Summary  Danielle Kent:[id number] DOB: 11-Aug-1986 DOA: 02/12/2021  PCP: Gerlene Fee, DO  Admit date: 02/12/2021 Discharge date: 02/14/2021  Time spent: 50 minutes  Recommendations for Outpatient Follow-up:  1. Follow-up PCP in 1 week 2. Obtain echocardiogram as outpatient   Discharge Diagnoses:  Active Problems:   Acute CHF (congestive heart failure) ([place])   Hypertensive emergency   Hypertensive urgency   Discharge Condition: Stable  Diet recommendation: Heart healthy diet  Filed Weights   02/12/21 1608 02/13/21 0447 02/14/21 0500  Weight: (!) 163.9 kg (!) 163 kg (!) 161.8 kg    History of present illness:  35 year old female with a history of hypertension, noncompliance with medications, morbid obesity presenting with shortness of breath.  She was found to have SBP greater than 210.  Started on clonidine and IV labetalol push.  Nitroglycerin drip was started and SBP dropped to 150s.  Chest x-ray showed bilateral pulmonary vascular congestion.  He received 1 dose of Lasix.  Hospital Course:  Hypertensive urgency-resolved, patient is off nitroglycerin drip.  Currently not requiring oxygen.  Blood pressure medications have been restarted.  Hypertension-we will discharge patient on amlodipine, labetalol, Cozaar, hydrochlorothiazide.  Blood pressure is better controlled.  She can follow-up with her PCP in 1 week.  Chronic diastolic CHF-currently euvolemic.  Not requiring oxygen.  No shortness of breath.  Follow-up PCP as outpatient.  Echocardiogram as outpatient.  CKD stage IIIa-creatinine stable 1.40 at baseline  Hypokalemia-replete  Procedures:    Consultations:    Discharge Exam: Vitals:   02/14/21 0614 02/14/21 1024  BP: (!) 187/104 (!) 156/83  Pulse: 91   Resp: 15   Temp: 98.3 F (36.8 C)   SpO2:      General: Appears in no acute distress Cardiovascular: S1-S2, regular Respiratory: Clear to auscultation bilaterally  Discharge  Instructions   Discharge Instructions    Diet - low sodium heart healthy   Complete by: As directed    Increase activity slowly   Complete by: As directed    Increase activity slowly   Complete by: As directed      Allergies as of 02/14/2021   No Known Allergies     Medication List    TAKE these medications   amLODipine 10 MG tablet Commonly known as: NORVASC Take 1 tablet (10 mg total) by mouth daily. What changed:   medication strength  how much to take  when to take this   Biofreeze 4 % Gel Generic drug: Menthol (Topical Analgesic) Apply 1 Tube topically 2 (two) times daily as needed (apply morning and night).   hydrochlorothiazide 25 MG tablet Commonly known as: HYDRODIURIL Take 1 tablet (25 mg total) by mouth daily. Start taking on: February 15, 2021   labetalol 200 MG tablet Commonly known as: NORMODYNE Take 1 tablet (200 mg total) by mouth 3 (three) times daily.   losartan 50 MG tablet Commonly known as: COZAAR Take 1 tablet (50 mg total) by mouth at bedtime.      No Known Allergies  Follow-up Information    Medicine, [facility].   Specialty: Family Medicine Why: This location has been assigned to the patient's [place]- Pt will need to go by office and fill out an application adn sign consent forms.  Contact information: [place] [address] [phone]                The results of significant diagnostics from this hospitalization (including imaging,  microbiology, ancillary and laboratory) are listed below for reference.    Significant Diagnostic Studies: DG Chest Port 1 View  Result Date: 02/12/2021 CLINICAL DATA:  Shortness of breath. Evaluation for pulmonary edema. EXAM: PORTABLE CHEST 1 VIEW COMPARISON:  Chest x-ray 11/25/2018. FINDINGS: Cardiomegaly. Mild bilateral interstitial prominence. Mild interstitial edema and/or pneumonitis cannot be excluded. No prominent pleural effusion. No  pneumothorax. IMPRESSION: Cardiomegaly. Mild bilateral interstitial prominence. Mild interstitial edema and/or pneumonitis cannot be excluded. Electronically Signed   By: Marcello Moores  Register   On: 02/12/2021 11:41    Microbiology: Recent Results (from the past 240 hour(s))  Resp Panel by RT-PCR (Flu A&B, Covid) Nasopharyngeal Swab     Status: None   Collection Time: 02/12/21  1:21 PM   Specimen: Nasopharyngeal Swab; Nasopharyngeal(NP) swabs in vial transport medium  Result Value Ref Range Status   SARS Coronavirus 2 by RT PCR NEGATIVE NEGATIVE Final    Comment: (NOTE) SARS-CoV-2 target nucleic acids are NOT DETECTED.  The SARS-CoV-2 RNA is generally detectable in upper respiratory specimens during the acute phase of infection. The lowest concentration of SARS-CoV-2 viral copies this assay can detect is 138 copies/mL. A negative result does not preclude SARS-Cov-2 infection and should not be used as the sole basis for treatment or other patient management decisions. A negative result may occur with  improper specimen collection/handling, submission of specimen other than nasopharyngeal swab, presence of viral mutation(s) within the areas targeted by this assay, and inadequate number of viral copies(<138 copies/mL). A negative result must be combined with clinical observations, patient history, and epidemiological information. The expected result is Negative.  Fact Sheet for Patients:  EntrepreneurPulse.com.au  Fact Sheet for Healthcare Providers:  IncredibleEmployment.be  This test is no t yet approved or cleared by the [place] FDA and  has been authorized for detection and/or diagnosis of SARS-CoV-2 by FDA under an Emergency Use Authorization (EUA). This EUA will remain  in effect (meaning this test can be used) for the duration of the COVID-19 declaration under Section 564(b)(1) of the Act, 21 U.S.C.section 360bbb-3(b)(1), unless the  authorization is terminated  or revoked sooner.       Influenza A by PCR NEGATIVE NEGATIVE Final   Influenza B by PCR NEGATIVE NEGATIVE Final    Comment: (NOTE) The Xpert Xpress SARS-CoV-2/FLU/RSV plus assay is intended as an aid in the diagnosis of influenza from Nasopharyngeal swab specimens and should not be used as a sole basis for treatment. Nasal washings and aspirates are unacceptable for Xpert Xpress SARS-CoV-2/FLU/RSV testing.  Fact Sheet for Patients: EntrepreneurPulse.com.au  Fact Sheet for Healthcare Providers: IncredibleEmployment.be  This test is not yet approved or cleared by the [place] FDA and has been authorized for detection and/or diagnosis of SARS-CoV-2 by FDA under an Emergency Use Authorization (EUA). This EUA will remain in effect (meaning this test can be used) for the duration of the COVID-19 declaration under Section 564(b)(1) of the Act, 21 U.S.C. section 360bbb-3(b)(1), unless the authorization is terminated or revoked.  Performed at [facility], [place] [address], [place], [place] [address]      Labs: Basic Metabolic Panel: Recent Labs  Lab 02/12/21 1049 02/13/21 0428 02/14/21 0234  NA 137 138 136  K 3.5 3.3* 4.1  CL 102 101 98  CO2 27 27 28   GLUCOSE 104* 96 109*  BUN 14 17 17   CREATININE 1.11* 1.40* 1.40*  CALCIUM 8.8* 9.0 9.1   Liver Function Tests: No results for input(s): AST, ALT, ALKPHOS, BILITOT,  PROT, ALBUMIN in the last 168 hours. No results for input(s): LIPASE, AMYLASE in the last 168 hours. No results for input(s): AMMONIA in the last 168 hours. CBC: Recent Labs  Lab 02/12/21 1049  WBC 7.8  NEUTROABS 5.5  HGB 13.2  HCT 42.4  MCV 83.6  PLT 252   Cardiac Enzymes: No results for input(s): CKTOTAL, CKMB, CKMBINDEX, TROPONINI in the last 168 hours. BNP: BNP (last 3 results) Recent Labs    02/12/21 1049  BNP 386.3*       Signed:  Oswald Hillock MD.  [facility] 02/14/2021, 11:58 AM

## 2021-02-14 ENCOUNTER — Other Ambulatory Visit: Payer: Self-pay | Admitting: Family Medicine

## 2021-02-14 DIAGNOSIS — I10 Essential (primary) hypertension: Secondary | ICD-10-CM

## 2021-02-14 LAB — BASIC METABOLIC PANEL
Anion gap: 10 (ref 5–15)
BUN: 17 mg/dL (ref 6–20)
CO2: 28 mmol/L (ref 22–32)
Calcium: 9.1 mg/dL (ref 8.9–10.3)
Chloride: 98 mmol/L (ref 98–111)
Creatinine, Ser: 1.4 mg/dL — ABNORMAL HIGH (ref 0.44–1.00)
GFR, Estimated: 50 mL/min — ABNORMAL LOW (ref >60)
Glucose, Bld: 109 mg/dL — ABNORMAL HIGH (ref 70–99)
Potassium: 4.1 mmol/L (ref 3.5–5.1)
Sodium: 136 mmol/L (ref 135–145)

## 2021-02-14 MED ORDER — LABETALOL HCL 200 MG PO TABS: 200.0000 mg | ORAL_TABLET | Freq: Three times a day (TID) | ORAL | 3 refills | Status: DC

## 2021-02-14 MED ORDER — AMLODIPINE BESYLATE 10 MG PO TABS: 10.0000 mg | ORAL_TABLET | Freq: Every day | ORAL | 3 refills | Status: DC

## 2021-02-14 MED ORDER — HYDROCHLOROTHIAZIDE 25 MG PO TABS: 25.0000 mg | ORAL_TABLET | Freq: Every day | ORAL | 3 refills | Status: DC

## 2021-02-14 MED ORDER — LOSARTAN POTASSIUM 50 MG PO TABS: 50.0000 mg | ORAL_TABLET | Freq: Every day | ORAL | 3 refills | Status: DC

## 2021-02-14 NOTE — Progress Notes (Signed)
Pt is alert and oriented. Discharge instructions/AVS given.

## 2021-02-14 NOTE — Plan of Care (Signed)
  Problem: Education: Goal: Knowledge of General Education information will improve Description: Including pain rating scale, medication(s)/side effects and non-pharmacologic comfort measures 02/14/2021 1358 by Wilder Glade, RN Outcome: Adequate for Discharge 02/14/2021 1358 by Wilder Glade, RN Outcome: Adequate for Discharge   Problem: Health Behavior/Discharge Planning: Goal: Ability to manage health-related needs will improve 02/14/2021 1358 by Wilder Glade, RN Outcome: Adequate for Discharge 02/14/2021 1358 by Wilder Glade, RN Outcome: Adequate for Discharge   Problem: Clinical Measurements: Goal: Ability to maintain clinical measurements within normal limits will improve 02/14/2021 1358 by Wilder Glade, RN Outcome: Adequate for Discharge 02/14/2021 1358 by Wilder Glade, RN Outcome: Adequate for Discharge Goal: Will remain free from infection 02/14/2021 1358 by Wilder Glade, RN Outcome: Adequate for Discharge 02/14/2021 1358 by Wilder Glade, RN Outcome: Adequate for Discharge Goal: Diagnostic test results will improve 02/14/2021 1358 by Wilder Glade, RN Outcome: Adequate for Discharge 02/14/2021 1358 by Wilder Glade, RN Outcome: Adequate for Discharge Goal: Respiratory complications will improve 02/14/2021 1358 by Wilder Glade, RN Outcome: Adequate for Discharge 02/14/2021 1358 by Wilder Glade, RN Outcome: Adequate for Discharge Goal: Cardiovascular complication will be avoided 02/14/2021 1358 by Wilder Glade, RN Outcome: Adequate for Discharge 02/14/2021 1358 by Wilder Glade, RN Outcome: Adequate for Discharge   Problem: Activity: Goal: Risk for activity intolerance will decrease 02/14/2021 1358 by Wilder Glade, RN Outcome: Adequate for Discharge 02/14/2021 1358 by Wilder Glade, RN Outcome: Adequate for Discharge   Problem: Nutrition: Goal: Adequate nutrition will be  maintained 02/14/2021 1358 by Wilder Glade, RN Outcome: Adequate for Discharge 02/14/2021 1358 by Wilder Glade, RN Outcome: Adequate for Discharge   Problem: Coping: Goal: Level of anxiety will decrease 02/14/2021 1358 by Wilder Glade, RN Outcome: Adequate for Discharge 02/14/2021 1358 by Wilder Glade, RN Outcome: Adequate for Discharge   Problem: Elimination: Goal: Will not experience complications related to bowel motility 02/14/2021 1358 by Wilder Glade, RN Outcome: Adequate for Discharge 02/14/2021 1358 by Wilder Glade, RN Outcome: Adequate for Discharge Goal: Will not experience complications related to urinary retention 02/14/2021 1358 by Wilder Glade, RN Outcome: Adequate for Discharge 02/14/2021 1358 by Wilder Glade, RN Outcome: Adequate for Discharge   Problem: Pain Managment: Goal: General experience of comfort will improve 02/14/2021 1358 by Wilder Glade, RN Outcome: Adequate for Discharge 02/14/2021 1358 by Wilder Glade, RN Outcome: Adequate for Discharge   Problem: Safety: Goal: Ability to remain free from injury will improve 02/14/2021 1358 by Wilder Glade, RN Outcome: Adequate for Discharge 02/14/2021 1358 by Wilder Glade, RN Outcome: Adequate for Discharge   Problem: Skin Integrity: Goal: Risk for impaired skin integrity will decrease 02/14/2021 1358 by Wilder Glade, RN Outcome: Adequate for Discharge 02/14/2021 1358 by Wilder Glade, RN Outcome: Adequate for Discharge

## 2021-02-14 NOTE — Progress Notes (Signed)
Patient was discharged yesterday however discharge was held after patient blood pressure was found to be elevated.  Medications have been adjusted.  Patient blood pressure is better controlled.  We will discharge her home today.

## 2021-02-14 NOTE — Plan of Care (Signed)

## 2021-02-14 NOTE — Discharge Instructions (Signed)
Heart Failure, Diagnosis  Heart failure means that your heart is not able to pump blood in the right way. This makes it hard for your body to work well. Heart failure is usually a long-term (chronic) condition. You must take good care of yourself and follow your treatment plan from your doctor. What are the causes?  High blood pressure.  Buildup of cholesterol and fat in the arteries.  Heart attack. This injures the heart muscle.  Heart valves that do not open and close properly.  Damage of the heart muscle. This is also called cardiomyopathy.  Infection of the heart muscle. This is also called myocarditis.  Lung disease. What increases the risk?  Getting older. The risk of heart failure goes up as a person ages.  Being overweight.  Being female.  Use tobacco or nicotine products.  Abusing alcohol or drugs.  Having taken medicines that can damage the heart.  Having any of these conditions: ? Diabetes. ? Abnormal heart rhythms. ? Thyroid problems. ? Low blood counts (anemia).  Having a family history of heart failure. What are the signs or symptoms?  Shortness of breath.  Coughing.  Swelling of the feet, ankles, legs, or belly.  Losing or gaining weight for no reason.  Trouble breathing.  Waking from sleep because of the need to sit up and get more air.  Fast heartbeat.  Being very tired.  Feeling dizzy, or feeling like you may pass out (faint).  Having no desire to eat.  Feeling like you may vomit (nauseous).  Peeing (urinating) more at night.  Feeling confused. How is this treated? This condition may be treated with:  Medicines. These can be given to treat blood pressure and to make the heart muscles stronger.  Changes in your daily life. These may include: ? Eating a healthy diet. ? Staying at a healthy body weight. ? Quitting tobacco, alcohol, and drug use. ? Doing exercises. ? Participating in a cardiac rehabilitation program. This program  helps you improve your health through exercise, education, and counseling.  Surgery. Surgery can be done to open blocked valves, or to put devices in the heart, such as pacemakers.  A donor heart (heart transplant). You will receive a healthy heart from a donor. Follow these instructions at home:  Treat other conditions as told by your doctor. These may include high blood pressure, diabetes, thyroid disease, or abnormal heart rhythms.  Learn as much as you can about heart failure.  Get support as you need it.  Keep all follow-up visits. Summary  Heart failure means that your heart is not able to pump blood in the right way.  This condition is often caused by high blood pressure, heart attack, or damage of the heart muscle.  Symptoms of this condition include shortness of breath and swelling of the feet, ankles, legs, or belly. You may also feel very tired or feel like you may vomit.  You may be treated with medicines, surgery, or changes in your daily life.  Treat other health conditions as told by your doctor. This information is not intended to replace advice given to you by your health care provider. Make sure you discuss any questions you have with your health care provider. Document Revised: 04/28/2020 Document Reviewed: 04/28/2020 Elsevier Patient Education  2021 [place].   Hypertension, Adult Hypertension is another name for high blood pressure. High blood pressure forces your heart to work harder to pump blood. This can cause problems over time. There are two numbers  in a blood pressure reading. There is a top number (systolic) over a bottom number (diastolic). It is best to have a blood pressure that is below 120/80. Healthy choices can help lower your blood pressure, or you may need medicine to help lower it. What are the causes? The cause of this condition is not known. Some conditions may be related to high blood pressure. What increases the  risk?  Smoking.  Having type 2 diabetes mellitus, high cholesterol, or both.  Not getting enough exercise or physical activity.  Being overweight.  Having too much fat, sugar, calories, or salt (sodium) in your diet.  Drinking too much alcohol.  Having long-term (chronic) kidney disease.  Having a family history of high blood pressure.  Age. Risk increases with age.  Race. You may be at higher risk if you are African American.  Gender. Men are at higher risk than women before age 71. After age 67, women are at higher risk than men.  Having obstructive sleep apnea.  Stress. What are the signs or symptoms?  High blood pressure may not cause symptoms. Very high blood pressure (hypertensive crisis) may cause: ? Headache. ? Feelings of worry or nervousness (anxiety). ? Shortness of breath. ? Nosebleed. ? A feeling of being sick to your stomach (nausea). ? Throwing up (vomiting). ? Changes in how you see. ? Very bad chest pain. ? Seizures. How is this treated?  This condition is treated by making healthy lifestyle changes, such as: ? Eating healthy foods. ? Exercising more. ? Drinking less alcohol.  Your health care provider may prescribe medicine if lifestyle changes are not enough to get your blood pressure under control, and if: ? Your top number is above 130. ? Your bottom number is above 80.  Your personal target blood pressure may vary. Follow these instructions at home: Eating and drinking  If told, follow the DASH eating plan. To follow this plan: ? Fill one half of your plate at each meal with fruits and vegetables. ? Fill one fourth of your plate at each meal with whole grains. Whole grains include whole-wheat pasta, brown rice, and whole-grain bread. ? Eat or drink low-fat dairy products, such as skim milk or low-fat yogurt. ? Fill one fourth of your plate at each meal with low-fat (lean) proteins. Low-fat proteins include fish, chicken without skin,  eggs, beans, and tofu. ? Avoid fatty meat, cured and processed meat, or chicken with skin. ? Avoid pre-made or processed food.  Eat less than 1,500 mg of salt each day.  Do not drink alcohol if: ? Your doctor tells you not to drink. ? You are pregnant, may be pregnant, or are planning to become pregnant.  If you drink alcohol: ? Limit how much you use to:  0-1 drink a day for women.  0-2 drinks a day for men. ? Be aware of how much alcohol is in your drink. In the [place] one drink equals one 12 oz bottle of beer (355 mL), one 5 oz glass of wine (148 mL), or one 1 oz glass of hard liquor (44 mL).   Lifestyle  Work with your doctor to stay at a healthy weight or to lose weight. Ask your doctor what the best weight is for you.  Get at least 30 minutes of exercise most days of the week. This may include walking, swimming, or biking.  Get at least 30 minutes of exercise that strengthens your muscles (resistance exercise) at least 3 days a week.  This may include lifting weights or doing Pilates.  Do not use any products that contain nicotine or tobacco, such as cigarettes, e-cigarettes, and chewing tobacco. If you need help quitting, ask your doctor.  Check your blood pressure at home as told by your doctor.  Keep all follow-up visits as told by your doctor. This is important.   Medicines  Take over-the-counter and prescription medicines only as told by your doctor. Follow directions carefully.  Do not skip doses of blood pressure medicine. The medicine does not work as well if you skip doses. Skipping doses also puts you at risk for problems.  Ask your doctor about side effects or reactions to medicines that you should watch for. Contact a doctor if you:  Think you are having a reaction to the medicine you are taking.  Have headaches that keep coming back (recurring).  Feel dizzy.  Have swelling in your ankles.  Have trouble with your vision. Get help right away if  you:  Get a very bad headache.  Start to feel mixed up (confused).  Feel weak or numb.  Feel faint.  Have very bad pain in your: ? Chest. ? Belly (abdomen).  Throw up more than once.  Have trouble breathing. Summary  Hypertension is another name for high blood pressure.  High blood pressure forces your heart to work harder to pump blood.  For most people, a normal blood pressure is less than 120/80.  Making healthy choices can help lower blood pressure. If your blood pressure does not get lower with healthy choices, you may need to take medicine. This information is not intended to replace advice given to you by your health care provider. Make sure you discuss any questions you have with your health care provider. Document Revised: 06/16/2018 Document Reviewed: 06/16/2018 Elsevier Patient Education  2021 [place].   Low-Sodium Eating Plan Sodium, which is an element that makes up salt, helps you maintain a healthy balance of fluids in your body. Too much sodium can increase your blood pressure and cause fluid and waste to be held in your body. Your health care provider or dietitian may recommend following this plan if you have high blood pressure (hypertension), kidney disease, liver disease, or heart failure. Eating less sodium can help lower your blood pressure, reduce swelling, and protect your heart, liver, and kidneys. What are tips for following this plan? Reading food labels  The Nutrition Facts label lists the amount of sodium in one serving of the food. If you eat more than one serving, you must multiply the listed amount of sodium by the number of servings.  Choose foods with less than 140 mg of sodium per serving.  Avoid foods with 300 mg of sodium or more per serving. Shopping  Look for lower-sodium products, often labeled as "low-sodium" or "no salt added."  Always check the sodium content, even if foods are labeled as "unsalted" or "no salt  added."  Buy fresh foods. ? Avoid canned foods and pre-made or frozen meals. ? Avoid canned, cured, or processed meats.  Buy breads that have less than 80 mg of sodium per slice.   Cooking  Eat more home-cooked food and less restaurant, buffet, and fast food.  Avoid adding salt when cooking. Use salt-free seasonings or herbs instead of table salt or sea salt. Check with your health care provider or pharmacist before using salt substitutes.  Cook with plant-based oils, such as canola, sunflower, or olive oil.   Meal planning  When eating at a restaurant, ask that your food be prepared with less salt or no salt, if possible. Avoid dishes labeled as brined, pickled, cured, smoked, or made with soy sauce, miso, or teriyaki sauce.  Avoid foods that contain MSG (monosodium glutamate). MSG is sometimes added to [place] food, bouillon, and some canned foods.  Make meals that can be grilled, baked, poached, roasted, or steamed. These are generally made with less sodium. General information Most people on this plan should limit their sodium intake to 1,500-2,000 mg (milligrams) of sodium each day. What foods should I eat? Fruits Fresh, frozen, or canned fruit. Fruit juice. Vegetables Fresh or frozen vegetables. "No salt added" canned vegetables. "No salt added" tomato sauce and paste. Low-sodium or reduced-sodium tomato and vegetable juice. Grains Low-sodium cereals, including oats, puffed wheat and rice, and shredded wheat. Low-sodium crackers. Unsalted rice. Unsalted pasta. Low-sodium bread. Whole-grain breads and whole-grain pasta. Meats and other proteins Fresh or frozen (no salt added) meat, poultry, seafood, and fish. Low-sodium canned tuna and salmon. Unsalted nuts. Dried peas, beans, and lentils without added salt. Unsalted canned beans. Eggs. Unsalted nut butters. Dairy Milk. Soy milk. Cheese that is naturally low in sodium, such as ricotta cheese, fresh mozzarella, or Swiss cheese.  Low-sodium or reduced-sodium cheese. Cream cheese. Yogurt. Seasonings and condiments Fresh and dried herbs and spices. Salt-free seasonings. Low-sodium mustard and ketchup. Sodium-free salad dressing. Sodium-free light mayonnaise. Fresh or refrigerated horseradish. Lemon juice. Vinegar. Other foods Homemade, reduced-sodium, or low-sodium soups. Unsalted popcorn and pretzels. Low-salt or salt-free chips. The items listed above may not be a complete list of foods and beverages you can eat. Contact a dietitian for more information. What foods should I avoid? Vegetables Sauerkraut, pickled vegetables, and relishes. Olives. [place] fries. Onion rings. Regular canned vegetables (not low-sodium or reduced-sodium). Regular canned tomato sauce and paste (not low-sodium or reduced-sodium). Regular tomato and vegetable juice (not low-sodium or reduced-sodium). Frozen vegetables in sauces. Grains Instant hot cereals. Bread stuffing, pancake, and biscuit mixes. Croutons. Seasoned rice or pasta mixes. Noodle soup cups. Boxed or frozen macaroni and cheese. Regular salted crackers. Self-rising flour. Meats and other proteins Meat or fish that is salted, canned, smoked, spiced, or pickled. Precooked or cured meat, such as sausages or meat loaves. Berniece Salines. Ham. Pepperoni. Hot dogs. Corned beef. Chipped beef. Salt pork. Jerky. Pickled herring. Anchovies and sardines. Regular canned tuna. Salted nuts. Dairy Processed cheese and cheese spreads. Hard cheeses. Cheese curds. Blue cheese. Feta cheese. String cheese. Regular cottage cheese. Buttermilk. Canned milk. Fats and oils Salted butter. Regular margarine. Ghee. Bacon fat. Seasonings and condiments Onion salt, garlic salt, seasoned salt, table salt, and sea salt. Canned and packaged gravies. Worcestershire sauce. Tartar sauce. Barbecue sauce. Teriyaki sauce. Soy sauce, including reduced-sodium. Steak sauce. Fish sauce. Oyster sauce. Cocktail sauce. Horseradish that you  find on the shelf. Regular ketchup and mustard. Meat flavorings and tenderizers. Bouillon cubes. Hot sauce. Pre-made or packaged marinades. Pre-made or packaged taco seasonings. Relishes. Regular salad dressings. Salsa. Other foods Salted popcorn and pretzels. Corn chips and puffs. Potato and tortilla chips. Canned or dried soups. Pizza. Frozen entrees and pot pies. The items listed above may not be a complete list of foods and beverages you should avoid. Contact a dietitian for more information. Summary  Eating less sodium can help lower your blood pressure, reduce swelling, and protect your heart, liver, and kidneys.  Most people on this plan should limit their sodium intake to 1,500-2,000 mg (milligrams) of sodium each  day.  Canned, boxed, and frozen foods are high in sodium. Restaurant foods, fast foods, and pizza are also very high in sodium. You also get sodium by adding salt to food.  Try to cook at home, eat more fresh fruits and vegetables, and eat less fast food and canned, processed, or prepared foods. This information is not intended to replace advice given to you by your health care provider. Make sure you discuss any questions you have with your health care provider. Document Revised: 11/11/2019 Document Reviewed: 09/07/2019 Elsevier Patient Education  2021 [employer].

## 2021-02-14 NOTE — Progress Notes (Signed)
Heart Failure Navigator Progress Note  Assessed for Heart & Vascular TOC clinic readiness.  Unfortunately at this time the patient does not meet criteria due to admission symptoms related more to hypertensive emergency and medication non-compliance.   Navigator available for reassessment of patient.   Pricilla Holm, RN, BSN Heart Failure Nurse Navigator [phone]

## 2021-03-28 IMAGING — US US MFM OB FOLLOW UP
1 series · 2 of 2 positions shown · non-contrast
Comparison: none

[Series 1: us mfm ob follow up · 2 of 2 slices shown]
[im 1/2]
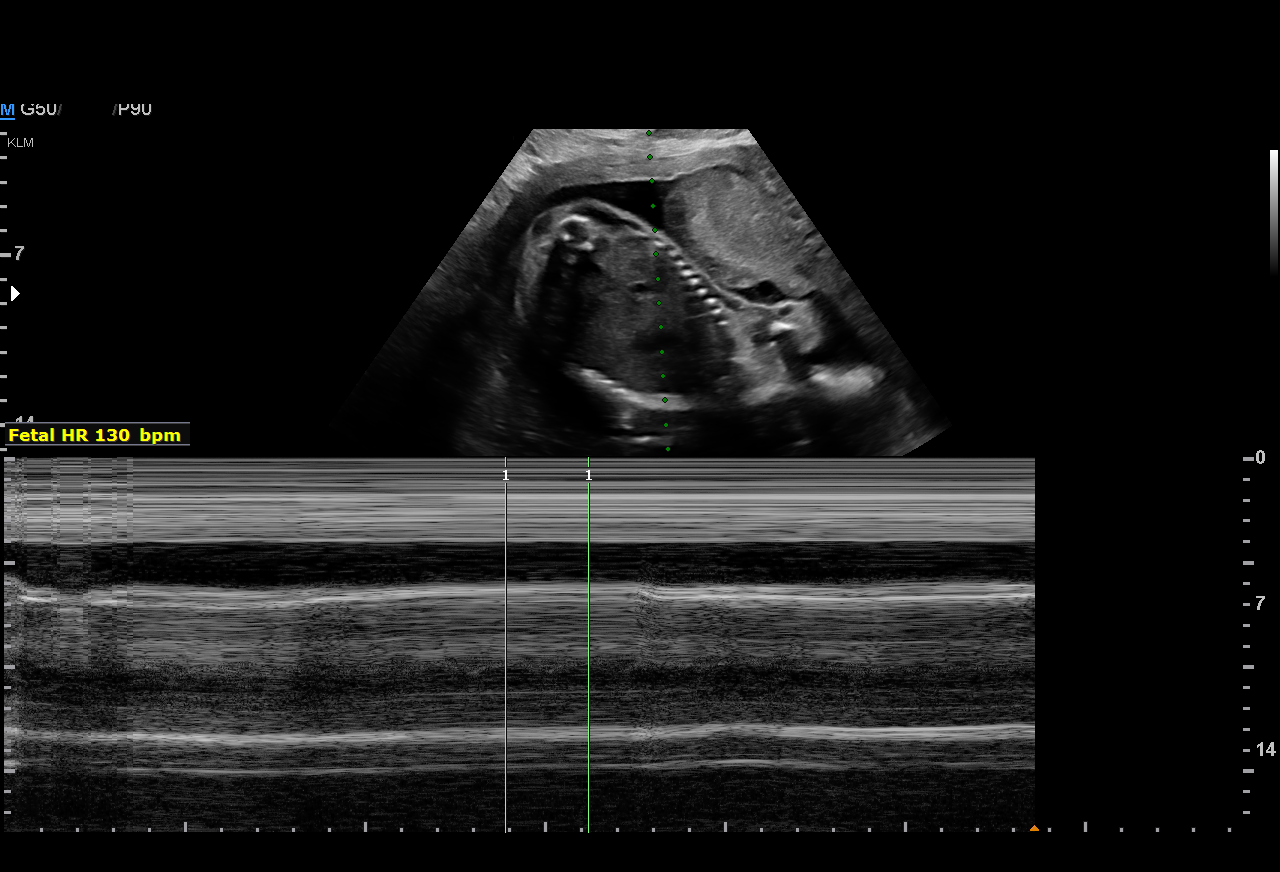
[im 2/2]
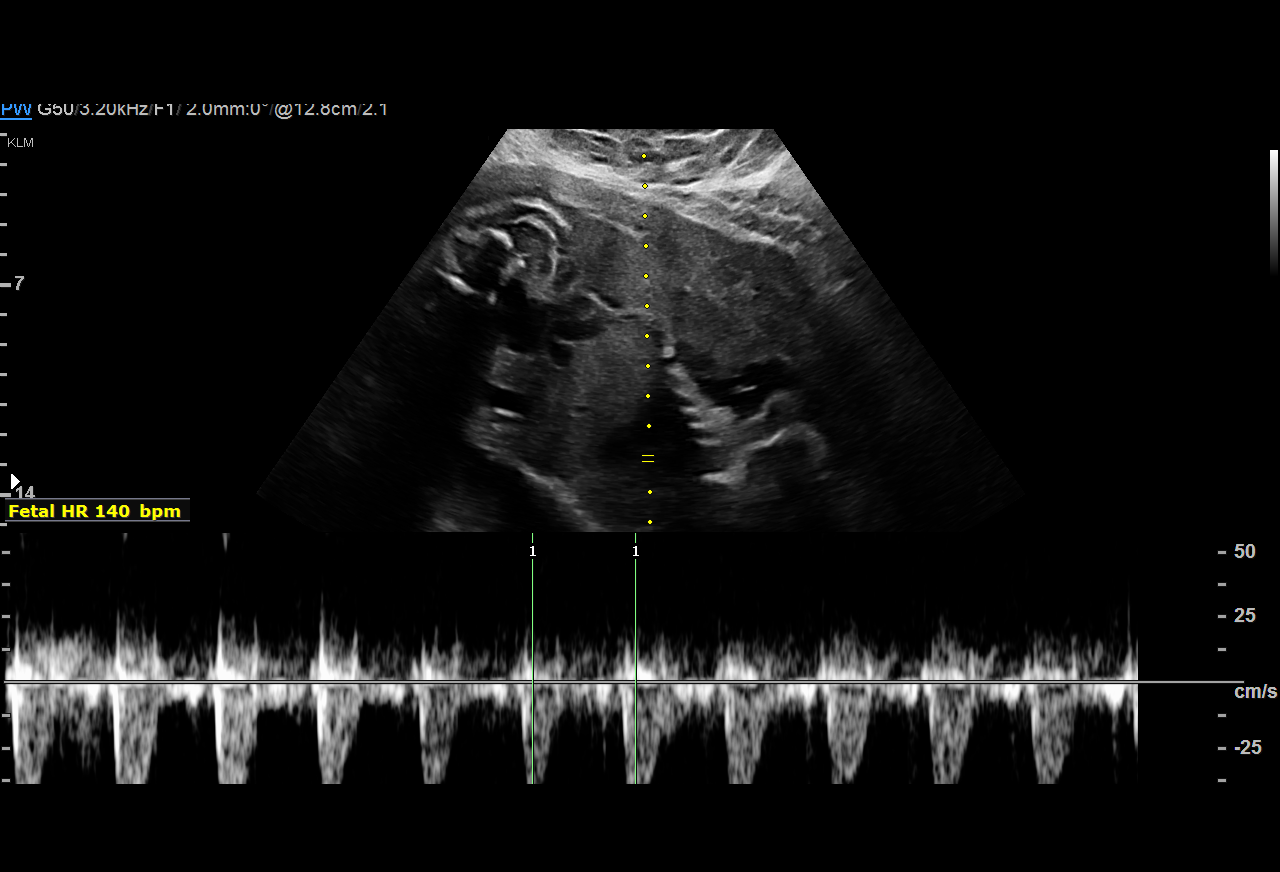

[2 of 2 positions shown; findings below may reference images not displayed]

[REDACTED]. [HOSPITAL],
                   [facility] CNM

 ----------------------------------------------------------------------

 ----------------------------------------------------------------------
Indications

  Hypertension - Chronic with superimposed
  preeclampsia
  History of cesarean delivery, currently
  pregnant
  Maternal morbid obesity (67.77)
  Gestational diabetes in pregnancy, diet
  controlled
  Uterine fibroids affecting pregnancy in        O34.12,
  second trimester, antepartum
  26 weeks gestation of pregnancy
 ----------------------------------------------------------------------
Vital Signs

                                                Height:        5'0"
Fetal Evaluation

 Num Of Fetuses:         1
 Cardiac Activity:       Observed
OB History

 Gravidity:    2         Term:   1
 Living:       1
Gestational Age

 Best:          26w 3d     Det. By:  Previous Ultrasound      EDD:   05/29/19
Impression

 Limited exam
Recommendations

 Follow up ss clinically indicated.

## 2021-03-29 IMAGING — US US MFM OB FOLLOW UP
1 series · 13 of 28 positions shown · non-contrast
Comparison: none

[Series 1: us mfm ob follow up · 13 of 36 slices shown]
[im 2/36]
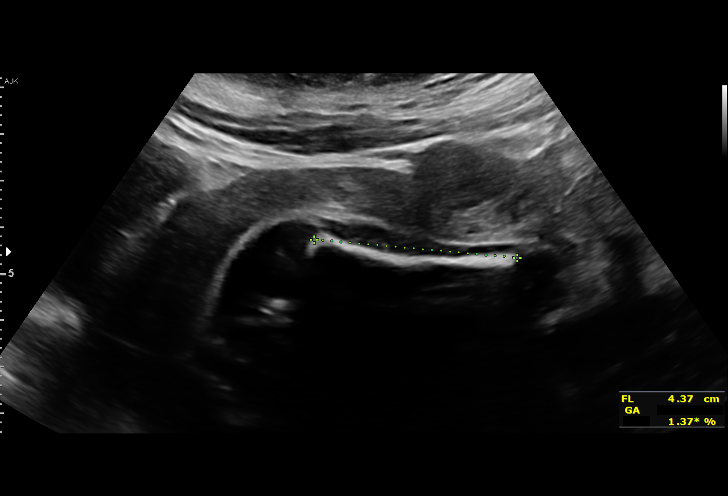
[im 4/36]
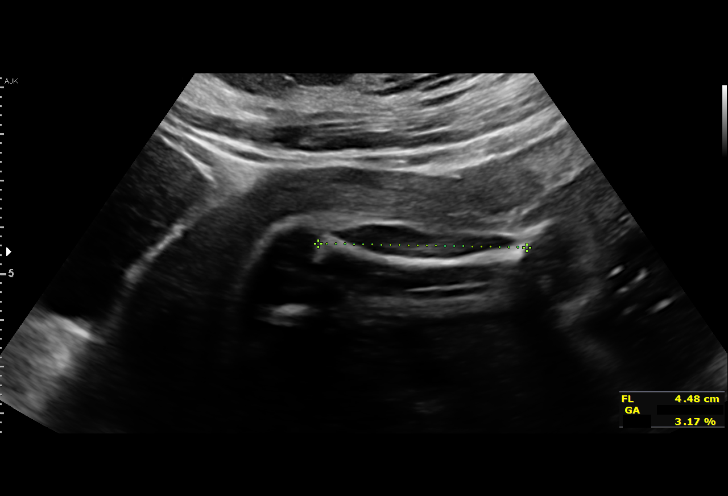
[im 7/36]
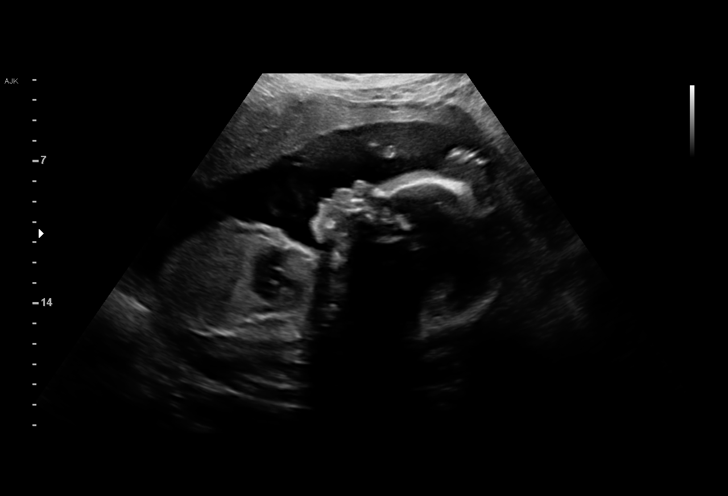
[im 10/36]
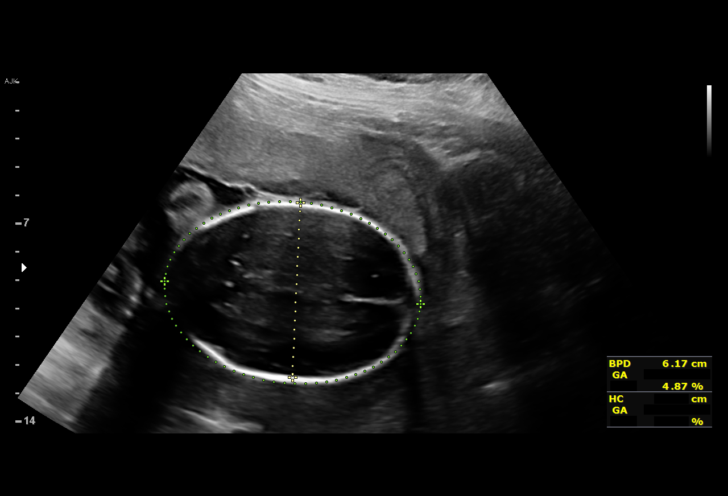
[im 12/36]
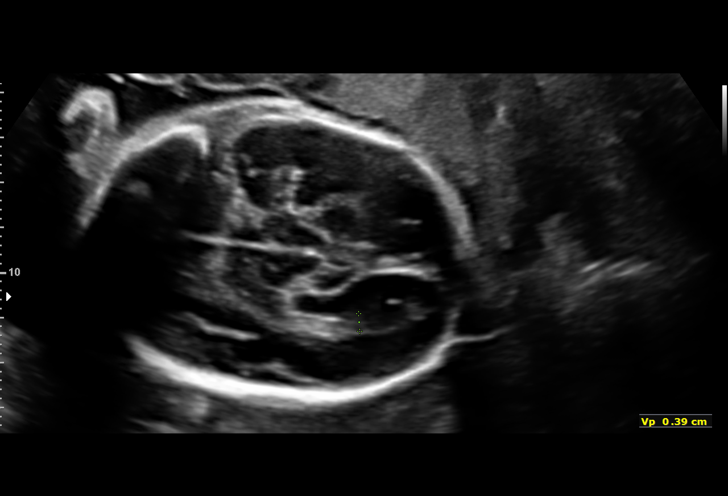
[im 15/36]
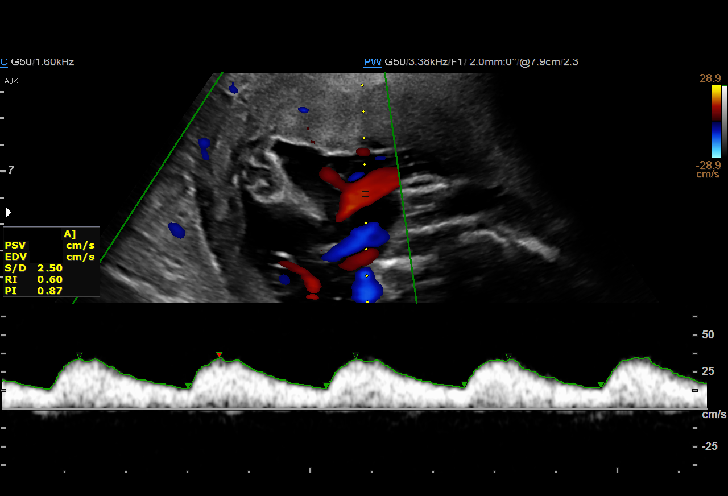
[im 19/36]
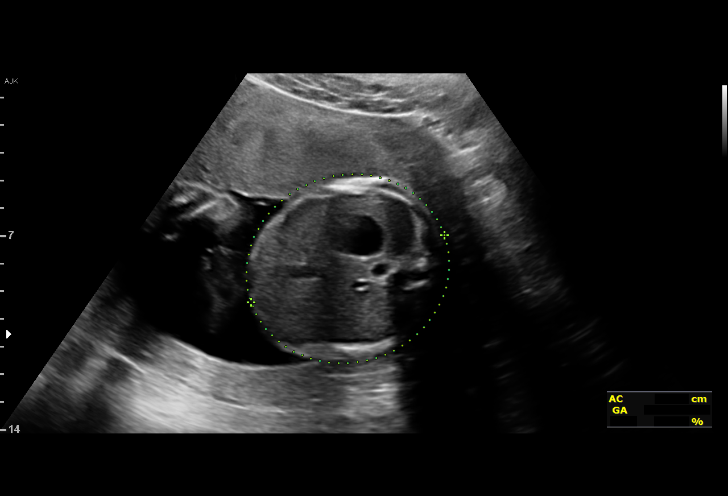
[im 21/36]
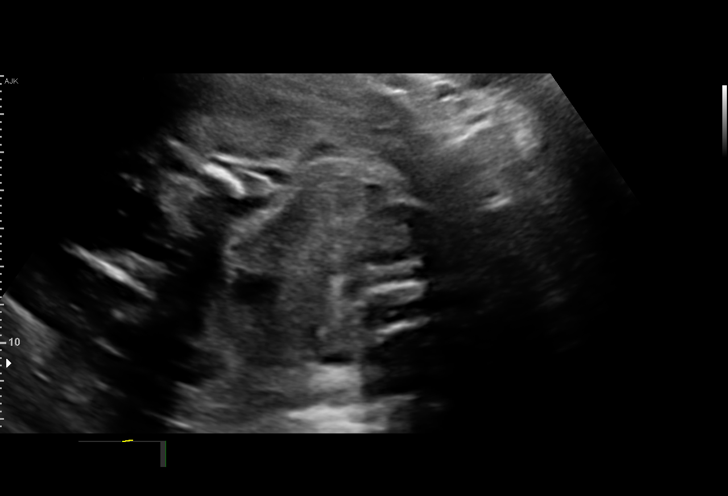
[im 24/36]
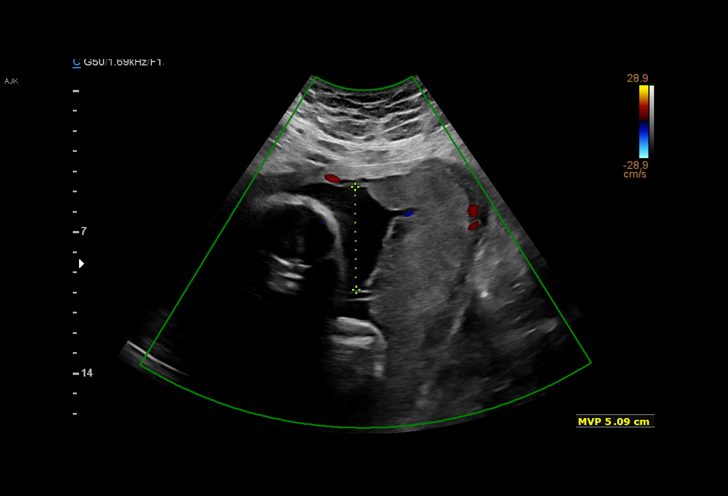
[im 26/36]
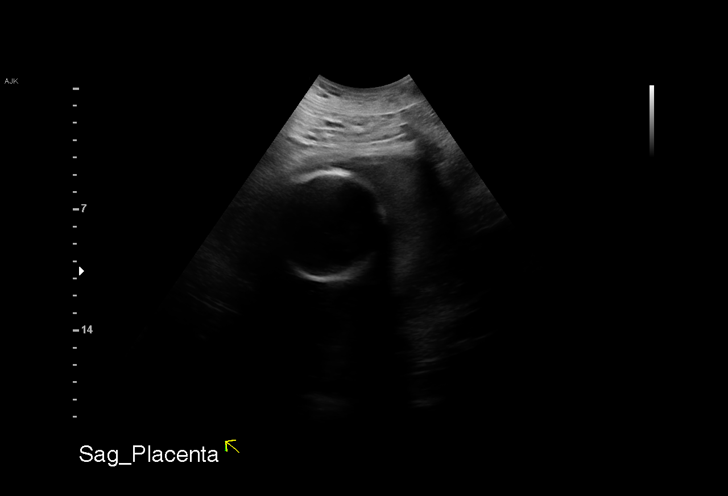
[im 29/36]
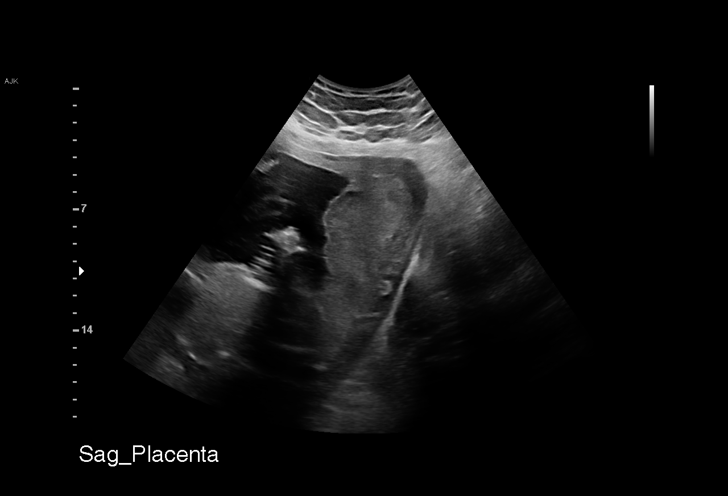
[im 32/36]
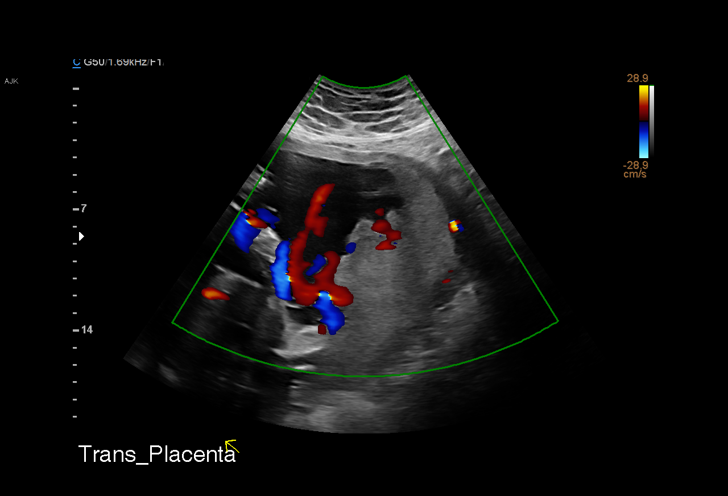
[im 34/36]
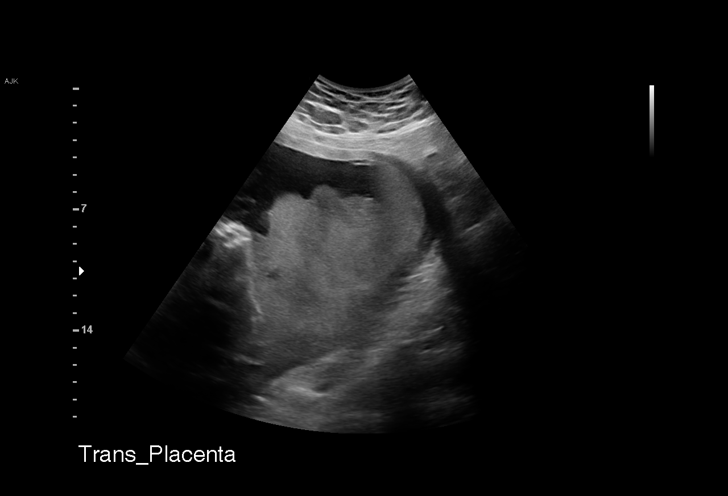

[13 of 28 positions shown; findings below may reference images not displayed]

[REDACTED]. [HOSPITAL],
                   [facility] CNM

  2  US MFM UA CORD DOPPLER               76820.02     MAARITE KEEGI
 ----------------------------------------------------------------------

 ----------------------------------------------------------------------
Indications

  Hypertension - Chronic with superimposed
  preeclampsia
  History of cesarean delivery, currently
  pregnant
  Maternal morbid obesity (67.77)
  Gestational diabetes in pregnancy, diet
  controlled
  Uterine fibroids affecting pregnancy in        O34.12,
  second trimester, antepartum
  26 weeks gestation of pregnancy
 ----------------------------------------------------------------------
Vital Signs

                                                Height:        5'0"
Fetal Evaluation

 Num Of Fetuses:         1
 Fetal Heart Rate(bpm):  137
 Cardiac Activity:       Observed
 Presentation:           Transverse, head to maternal left
 Placenta:               Anterior
 P. Cord Insertion:      Visualized, central

 Amniotic Fluid
 AFI FV:      Within normal limits

                             Largest Pocket(cm)

Biometry

 BPD:      62.3  mm     G. Age:  25w 2d          7  %    CI:        66.14   %    70 - 86
                                                         FL/HC:      17.9   %    18.6 -
 HC:      245.7  mm     G. Age:  26w 5d         27  %    HC/AC:      1.11        1.05 -
 AC:      221.8  mm     G. Age:  26w 4d         43  %    FL/BPD:     70.6   %    71 - 87
 FL:         44  mm     G. Age:  24w 4d        < 3  %    FL/AC:      19.8   %    20 - 24

 LV:        3.9  mm

 Est. FW:     852  gm    1 lb 14 oz      36  %
OB History

 Gravidity:    2         Term:   1
 Living:       1
Gestational Age

 U/S Today:     25w 6d                                        EDD:   06/03/19
 Best:          26w 4d     Det. By:  Previous Ultrasound      EDD:   05/29/19
Anatomy

 Cranium:               Appears normal         Aortic Arch:            Previously seen
 Cavum:                 Previously seen        Ductal Arch:            Not well visualized
 Ventricles:            Appears normal         Diaphragm:              Appears normal
 Choroid Plexus:        Previously seen        Stomach:                Appears normal, left
                                                                       sided
 Cerebellum:            Previously seen        Abdomen:                Appears normal
 Posterior Fossa:       Not well visualized    Abdominal Wall:         Previously seen
 Nuchal Fold:           Not applicable (>20    Cord Vessels:           Previously seen
                        wks GA)
 Face:                  Profile nl; orbits not Kidneys:                Appear normal
                        well visualized
 Lips:                  Not well visualized    Bladder:                Appears normal
 Thoracic:              Appears normal         Spine:                  Previously seen
 Heart:                 Previously seen        Upper Extremities:      Previously seen
 RVOT:                  Previously seen        Lower Extremities:      Previously seen
 LVOT:                  Previously seen

 Other:  Female gender Technically difficult due to maternal habitus and fetal
         position.
Doppler - Fetal Vessels

 Umbilical Artery
  S/D     %tile     RI              PI                     ADFV    RDFV
 2.81       32   0.64             0.92                        No      No

Impression

 On ultrasound, fetal growth is appropriate for gestational age.
 Amniotic fluid is normal and good fetal activity is seen.
 Umbilical artery Doppler showed normal diastolic flow.
 Placenta is anterior and there is no evidence of previa or
 accreta.

 See detailed consultation note in [REDACTED].
                 [place], [place]

## 2021-03-29 IMAGING — US US RENAL
1 series · 15 of 25 positions shown · non-contrast
Comparison: None.

CLINICAL DATA: Elevated creatinine. Twenty-six weeks pregnant.
Morbid obesity

EXAM:
RENAL / URINARY TRACT ULTRASOUND COMPLETE

[Series 1: us renal · 15 of 41 slices shown]
[im 1/41]
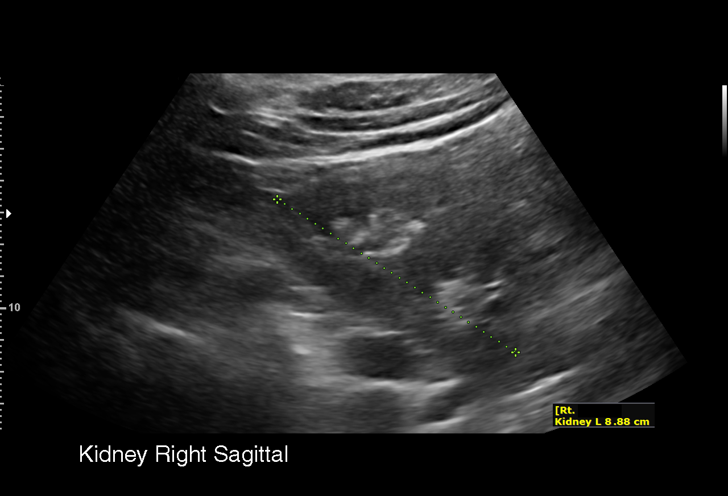
[im 4/41]
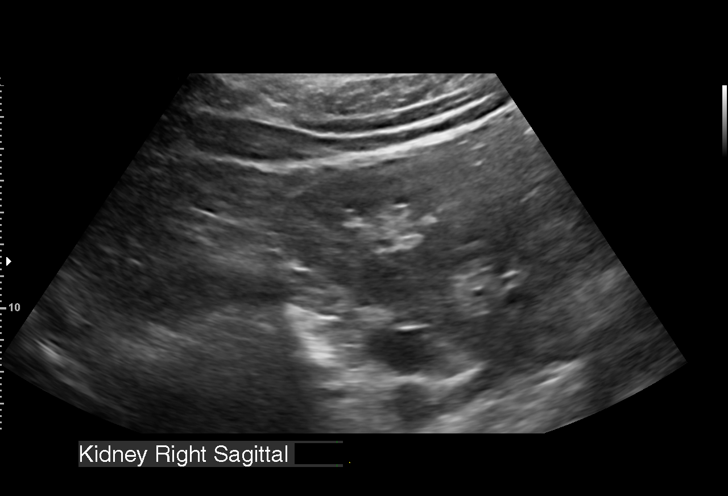
[im 7/41]
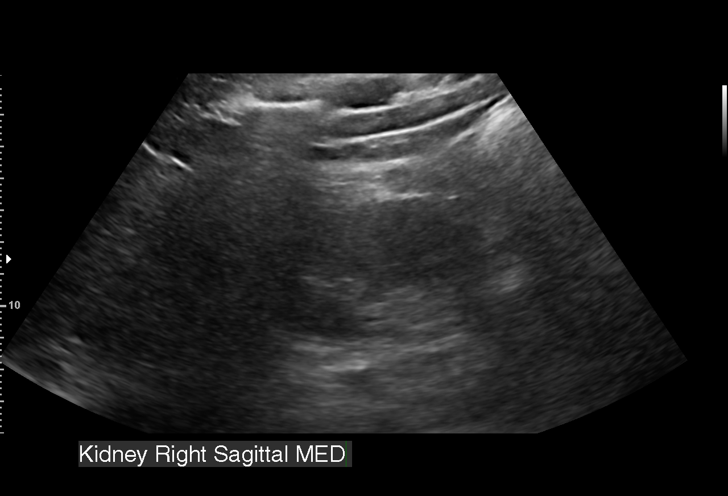
[im 9/41]
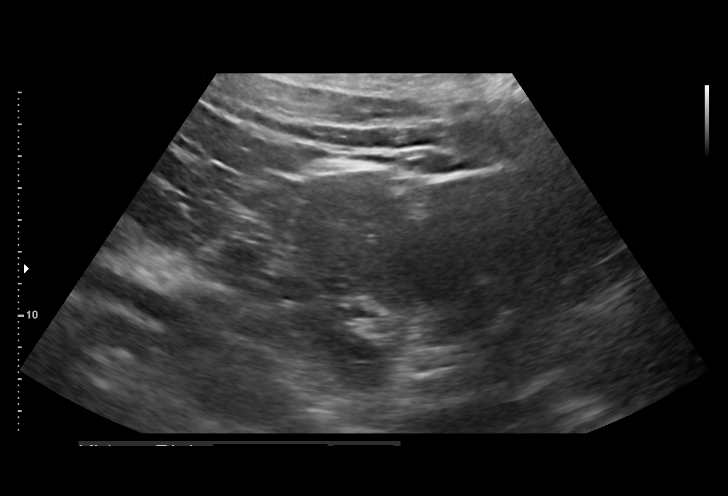
[im 12/41]
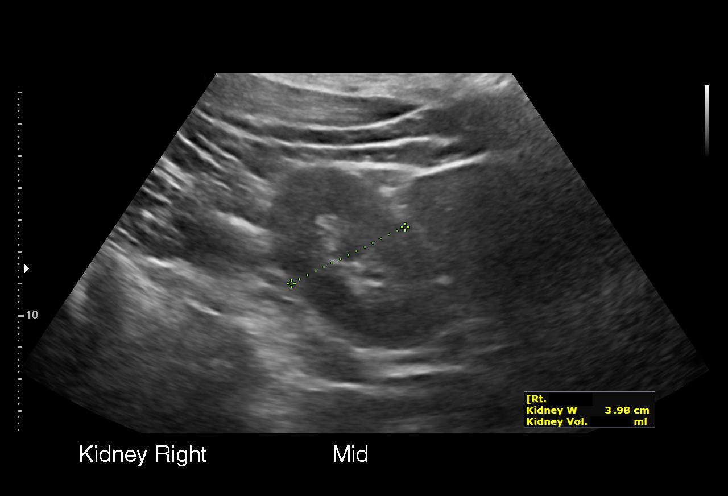
[im 16/41]
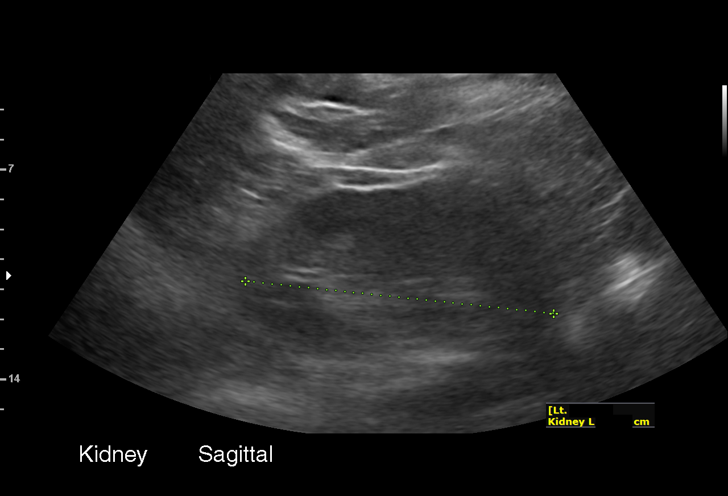
[im 17/41]
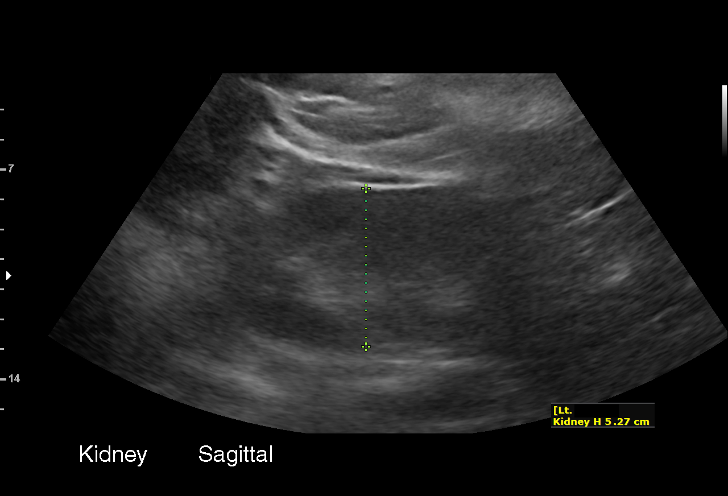
[im 21/41]
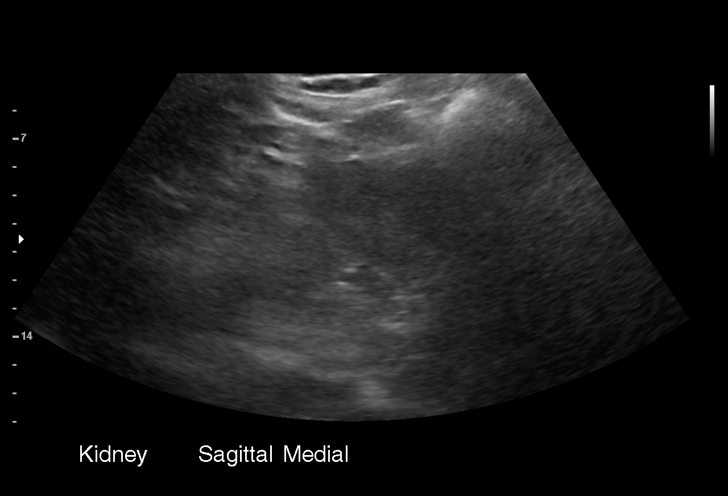
[im 24/41]
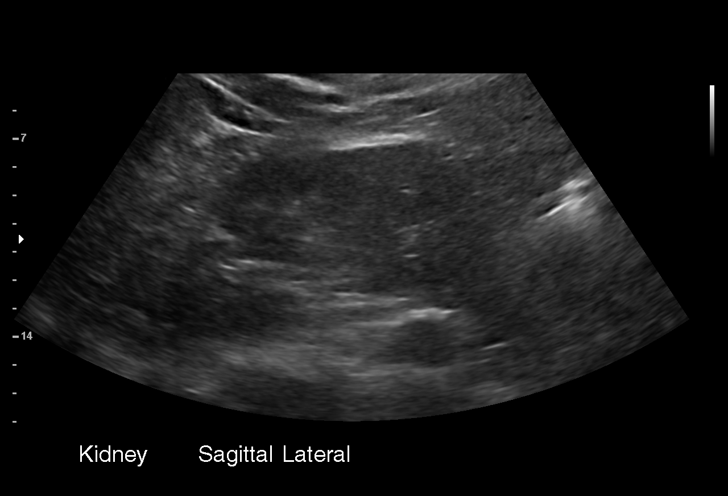
[im 26/41]
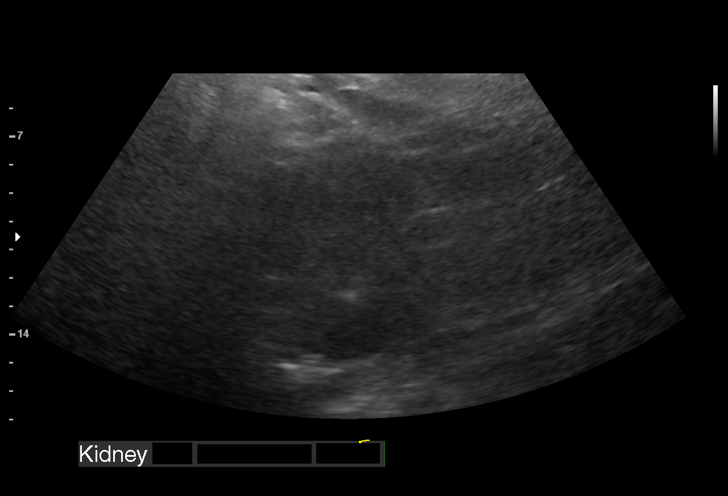
[im 29/41]
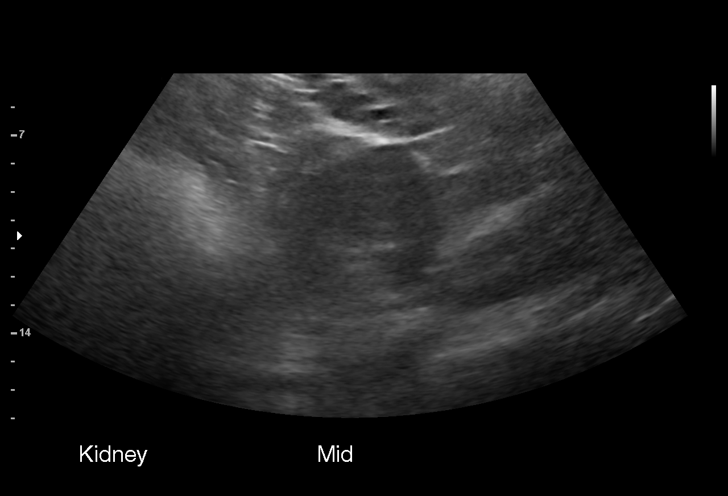
[im 32/41]
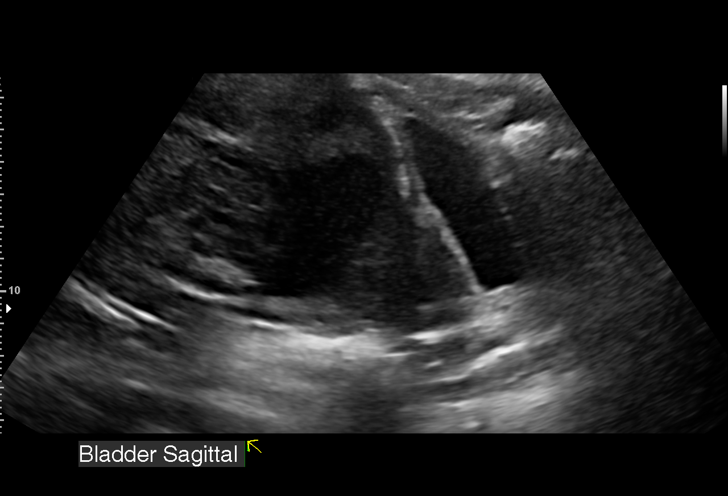
[im 34/41]
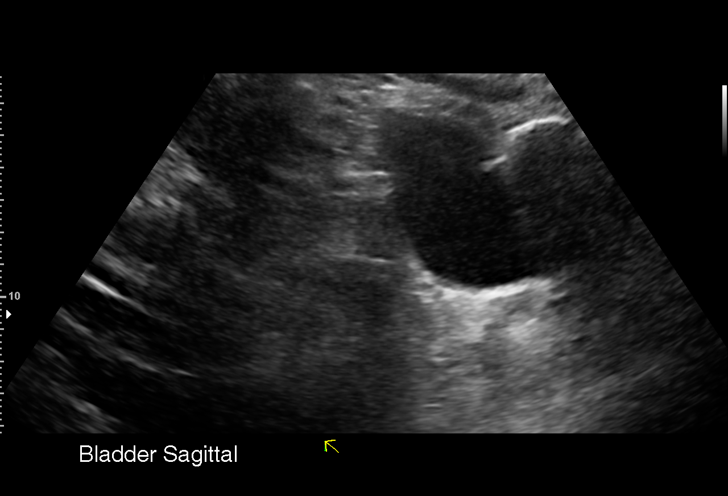
[im 37/41]
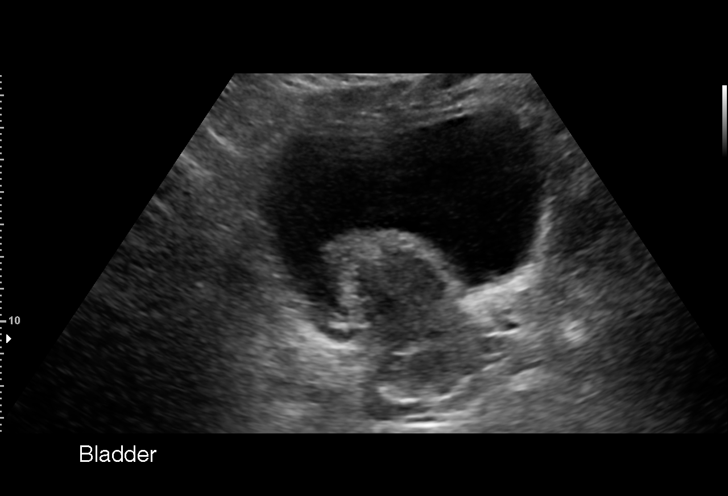
[im 41/41]
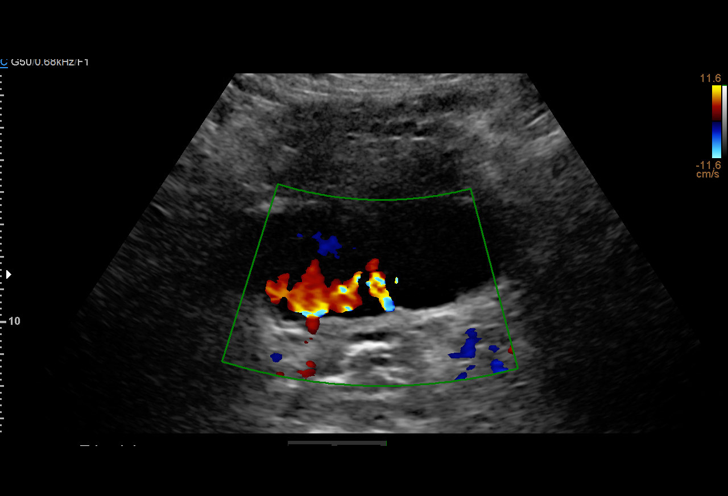

[15 of 25 positions shown; findings below may reference images not displayed]

FINDINGS: Right Kidney:

Renal measurements: 8.9 x 5.5 x 4.4 cm = volume: 101.6 ML. No
hydronephrosis. Not diagnostic for renal mass.

Left Kidney:

Renal measurements: 10.9 x 5.3 x 5.8 cm = volume: 173 mL. No
hydronephrosis. A diagnostic for renal mass.

Bladder:

Bilateral ureteral jets noted.
IMPRESSION: 1. No hydronephrosis.  Limited exam due to body habitus.
2. Normal bladder.

## 2021-08-09 ENCOUNTER — Other Ambulatory Visit: Payer: Self-pay

## 2021-08-09 ENCOUNTER — Encounter: Payer: Self-pay | Admitting: Pulmonary Disease

## 2021-08-09 ENCOUNTER — Ambulatory Visit (INDEPENDENT_AMBULATORY_CARE_PROVIDER_SITE_OTHER): Payer: Medicaid Other | Admitting: Pulmonary Disease

## 2021-08-09 VITALS — BP 158/86 | HR 100 | Temp 98.3°F | Ht 60.0 in | Wt 359.0 lb

## 2021-08-09 DIAGNOSIS — R0683 Snoring: Secondary | ICD-10-CM | POA: Diagnosis not present

## 2021-08-09 NOTE — Progress Notes (Signed)
Danielle Kent, Critical Care, and Sleep Medicine  Chief Complaint  Patient presents with   Consult    Sleep evaluation     Past Surgical History:  She  has a past surgical history that includes Cesarean section and Cesarean section with bilateral tubal ligation (N/A, 04/03/2019).  Past Medical History:  Asthma, Gestational DM, HTN, Diastolic CHF  Constitutional:  BP (!) 158/86 (BP Location: Right Wrist, Cuff Size: Large)   Pulse 100   Temp 98.3 F (36.8 C)   Ht 5' (1.524 m)   Wt (!) 359 lb (162.8 kg)   SpO2 98%   BMI 70.11 kg/m   Brief Summary:  Danielle Kent is a 35 y.o. female with snoring.      Subjective:   She was in hospital in April for hypertensive urgency.  She was seen recently by her PCP.  Her daughter says she snores and stops breathing at night.  She has trouble sleeping on her back.  She goes to sleep at 9 pm.  She falls asleep quickly.  She wakes up 2 or 3 times to use the bathroom.  She gets out of bed at 6 am.  She feels okay in the morning.  She denies morning headache.  She does not use anything to help her fall sleep or stay awake.  She denies sleep walking, sleep talking, bruxism, or nightmares.  There is no history of restless legs.  She denies sleep hallucinations, sleep paralysis, or cataplexy.  The Epworth score is 9 out of 24.   Physical Exam:   Appearance - well kempt   ENMT - no sinus tenderness, no oral exudate, no LAN, Mallampati 4 airway, no stridor  Respiratory - equal breath sounds bilaterally, no wheezing or rales  CV - s1s2 regular rate and rhythm, no murmurs  Ext - no clubbing, no edema  Skin - no rashes  Psych - normal mood and affect   Sleep Tests:    Cardiac Tests:  Echo 11/26/18 >> EF 55 to 60%, mild LA dilation  Social History:  She  reports that she has never smoked. She has never used smokeless tobacco. She reports that she does not currently use alcohol. She reports that she does not currently use  drugs.  Family History:  Her family history includes Asthma in her father; Diabetes in her mother; Heart attack (age of onset: 58) in her maternal grandfather; Hypertension in her mother; Multiple sclerosis in her mother.    Discussion:  She has snoring, sleep disruption, apnea and daytime sleepiness.  She has history of refractory hypertension.  Her BMI is > 35.  I am concerned she could have obstructive sleep apnea.  Assessment/Plan:   Snoring with excessive daytime sleepiness. - will need to arrange for a home sleep study  Obesity. - discussed how weight can impact sleep and risk for sleep disordered breathing - discussed options to assist with weight loss: combination of diet modification, cardiovascular and strength training exercises  Cardiovascular risk. - had an extensive discussion regarding the adverse health consequences related to untreated sleep disordered breathing - specifically discussed the risks for hypertension, coronary artery disease, cardiac dysrhythmias, cerebrovascular disease, and diabetes - lifestyle modification discussed  Safe driving practices. - discussed how sleep disruption can increase risk of accidents, particularly when driving - safe driving practices were discussed  Therapies for obstructive sleep apnea. - if the sleep study shows significant sleep apnea, then various therapies for treatment were reviewed: CPAP, oral appliance, and surgical interventions  Time Spent Involved in Patient Care on Day of Examination:  32 minutes  Follow up:   Patient Instructions  Will arrange for home sleep study Will call to arrange for follow up after sleep study reviewed  Medication List:   Allergies as of 08/09/2021   No Known Allergies      Medication List        Accurate as of August 09, 2021  3:47 PM. If you have any questions, ask your nurse or doctor.          STOP taking these medications    Biofreeze 4 % Gel Generic drug: Menthol  (Topical Analgesic) Stopped by: Chesley Mires, MD       TAKE these medications    amLODipine 10 MG tablet Commonly known as: NORVASC Take 1 tablet (10 mg total) by mouth daily.   gabapentin 100 MG capsule Commonly known as: NEURONTIN Take 100 mg by mouth daily.   hydrochlorothiazide 25 MG tablet Commonly known as: HYDRODIURIL Take 1 tablet (25 mg total) by mouth daily.   labetalol 200 MG tablet Commonly known as: NORMODYNE Take 1 tablet (200 mg total) by mouth 3 (three) times daily.   losartan 50 MG tablet Commonly known as: COZAAR Take 1 tablet (50 mg total) by mouth at bedtime.        Signature:  Chesley Mires, MD [place] Pager - [phone] 08/09/2021, 3:47 PM

## 2021-08-09 NOTE — Patient Instructions (Signed)
Will arrange for home sleep study Will call to arrange for follow up after sleep study reviewed  

## 2021-10-03 NOTE — Progress Notes (Deleted)
Cardiology Office Note   Date:  10/03/2021   ID:  Danielle Kent, DOB Jun 01, 1986, MRN [id number]  PCP:  Gerlene Fee, DO  Cardiologist:   Minus Breeding, MD Referring:  ***  No chief complaint on file.     History of Present Illness: Danielle Kent is a 35 y.o. female who presents for ***     Past Medical History:  Diagnosis Date   Asthma    Gestational diabetes 2020   Hypertension     Past Surgical History:  Procedure Laterality Date   CESAREAN SECTION     CESAREAN SECTION WITH BILATERAL TUBAL LIGATION N/A 04/03/2019   Procedure: CESAREAN SECTION WITH BILATERAL TUBAL LIGATION;  Surgeon: Florian Buff, MD;  Location: [facility];  Service: Obstetrics;  Laterality: N/A;     Current Outpatient Medications  Medication Sig Dispense Refill   amLODipine (NORVASC) 10 MG tablet Take 1 tablet (10 mg total) by mouth daily. 30 tablet 3   gabapentin (NEURONTIN) 100 MG capsule Take 100 mg by mouth daily.     hydrochlorothiazide (HYDRODIURIL) 25 MG tablet Take 1 tablet (25 mg total) by mouth daily. 30 tablet 3   labetalol (NORMODYNE) 200 MG tablet Take 1 tablet (200 mg total) by mouth 3 (three) times daily. 90 tablet 3   losartan (COZAAR) 50 MG tablet Take 1 tablet (50 mg total) by mouth at bedtime. 90 tablet 3   No current facility-administered medications for this visit.    Allergies:   Patient has no known allergies.    Social History:  The patient  reports that she has never smoked. She has never used smokeless tobacco. She reports that she does not currently use alcohol. She reports that she does not currently use drugs.   Family History:  The patient's ***family history includes Asthma in her father; Diabetes in her mother; Heart attack (age of onset: 47) in her maternal grandfather; Hypertension in her mother; Multiple sclerosis in her mother.    ROS:  Please see the history of present illness.   Otherwise, review of systems are positive for {NONE DEFAULTED:18576}.   All  other systems are reviewed and negative.    PHYSICAL EXAM: VS:  There were no vitals taken for this visit. , BMI There is no height or weight on file to calculate BMI. GENERAL:  Well appearing HEENT:  Pupils equal round and reactive, fundi not visualized, oral mucosa unremarkable NECK:  No jugular venous distention, waveform within normal limits, carotid upstroke brisk and symmetric, no bruits, no thyromegaly LYMPHATICS:  No cervical, inguinal adenopathy LUNGS:  Clear to auscultation bilaterally BACK:  No CVA tenderness CHEST:  Unremarkable HEART:  PMI not displaced or sustained,S1 and S2 within normal limits, no S3, no S4, no clicks, no rubs, *** murmurs ABD:  Flat, positive bowel sounds normal in frequency in pitch, no bruits, no rebound, no guarding, no midline pulsatile mass, no hepatomegaly, no splenomegaly EXT:  2 plus pulses throughout, no edema, no cyanosis no clubbing SKIN:  No rashes no nodules NEURO:  Cranial nerves II through XII grossly intact, motor grossly intact throughout PSYCH:  Cognitively intact, oriented to person place and time    EKG:  EKG {ACTION; IS/IS GYI:[id number]} ordered today. The ekg ordered today demonstrates ***   Recent Labs: 02/12/2021: B Natriuretic Peptide 386.3; Hemoglobin 13.2; Platelets 252; TSH 1.655 02/14/2021: BUN 17; Creatinine, Ser 1.40; Potassium 4.1; Sodium 136    Lipid Panel    Component Value Date/Time   CHOL  192 12/29/2019 1141   TRIG 194 (H) 12/29/2019 1141   HDL 46 12/29/2019 1141   CHOLHDL 4.2 12/29/2019 1141   LDLCALC 112 (H) 12/29/2019 1141      Wt Readings from Last 3 Encounters:  08/09/21 (!) 359 lb (162.8 kg)  02/14/21 (!) 356 lb 12.8 oz (161.8 kg)  02/20/20 (!) 351 lb (159.2 kg)      Other studies Reviewed: Additional studies/ records that were reviewed today include: ***. Review of the above records demonstrates:  Please see elsewhere in the note.  ***   ASSESSMENT AND PLAN:  ***   Current medicines are  reviewed at length with the patient today.  The patient {ACTIONS; HAS/DOES NOT HAVE:19233} concerns regarding medicines.  The following changes have been made:  {PLAN; NO CHANGE:13088:s}  Labs/ tests ordered today include: *** No orders of the defined types were placed in this encounter.    Disposition:   FU with ***    Signed, Minus Breeding, MD  10/03/2021 5:11 PM    [facility]

## 2021-10-04 ENCOUNTER — Ambulatory Visit: Payer: Medicaid Other | Admitting: Cardiology

## 2021-11-02 DIAGNOSIS — R011 Cardiac murmur, unspecified: Secondary | ICD-10-CM | POA: Insufficient documentation

## 2021-11-02 NOTE — Progress Notes (Signed)
,    Cardiology Office Note   Date:  11/04/2021   ID:  Danielle Kent, DOB 06/17/1986, MRN [id number]  PCP:  Gerlene Fee, DO  Cardiologist:   Minus Breeding, MD Referring:  Joycelyn Man, FNP  Chief Complaint  Patient presents with   Heart Murmur      History of Present Illness: Danielle Kent is a 36 y.o. female who presents for evaluation of a murmur.   She was referred by Joycelyn Man, FNP.  She has been noted to have a heart murmur.  She says this was mentioned 2 years ago when she had her last pregnancy but not really before this.  I do see an echocardiogram from 2020 that reports a well-preserved ejection fraction but does not suggest any valvular abnormalities.  The patient does well.  She has an 37 year old and a 3-year-old daughter.  She has to take care of them.  She works from home.  She does all the household chores.  She actually goes to the gym and does elliptical and stairmaster. The patient denies any new symptoms such as chest discomfort, neck or arm discomfort. There has been no new shortness of breath, PND or orthopnea. There have been no reported palpitations, presyncope or syncope.   Past Medical History:  Diagnosis Date   Asthma    Gestational diabetes 2020   Hypertension     Past Surgical History:  Procedure Laterality Date   CESAREAN SECTION     CESAREAN SECTION WITH BILATERAL TUBAL LIGATION N/A 04/03/2019   Procedure: CESAREAN SECTION WITH BILATERAL TUBAL LIGATION;  Surgeon: Florian Buff, MD;  Location: [facility] LD ORS;  Service: Obstetrics;  Laterality: N/A;     Current Outpatient Medications  Medication Sig Dispense Refill   amLODipine (NORVASC) 10 MG tablet Take 1 tablet (10 mg total) by mouth daily. 30 tablet 3   atorvastatin (LIPITOR) 40 MG tablet Take 40 mg by mouth daily.     gabapentin (NEURONTIN) 100 MG capsule Take 600 mg by mouth daily.     hydrochlorothiazide (HYDRODIURIL) 25 MG tablet Take 1 tablet (25 mg total) by mouth daily. 30 tablet 3    labetalol (NORMODYNE) 200 MG tablet Take 1 tablet (200 mg total) by mouth 3 (three) times daily. 90 tablet 3   losartan (COZAAR) 50 MG tablet Take 1 tablet (50 mg total) by mouth at bedtime. 90 tablet 3   Vitamin D, Ergocalciferol, (DRISDOL) 1.25 MG (50000 UNIT) CAPS capsule Take 50,000 Units by mouth once a week.     No current facility-administered medications for this visit.    Allergies:   Patient has no known allergies.    Social History:  The patient  reports that she has never smoked. She has never used smokeless tobacco. She reports that she does not currently use alcohol. She reports that she does not currently use drugs.   Family History:  The patient's family history includes Asthma in her father; Diabetes in her mother; Heart attack (age of onset: 85) in her maternal grandfather; Hypertension in her mother; Multiple sclerosis in her mother.    ROS:  Please see the history of present illness.   Otherwise, review of systems are positive for none.   All other systems are reviewed and negative.    PHYSICAL EXAM: VS:  BP (!) 142/78    Pulse 78    Ht 5' (1.524 m)    Wt (!) 357 lb 6.4 oz (162.1 kg)    SpO2 95%  BMI 69.80 kg/m  , BMI Body mass index is 69.8 kg/m. GENERAL:  Well appearing HEENT:  Pupils equal round and reactive, fundi not visualized, oral mucosa unremarkable NECK:  No jugular venous distention, waveform within normal limits, carotid upstroke brisk and symmetric, no bruits, no thyromegaly LYMPHATICS:  No cervical, inguinal adenopathy LUNGS:  Clear to auscultation bilaterally BACK:  No CVA tenderness CHEST:  Unremarkable HEART:  PMI not displaced or sustained,S1 and S2 within normal limits, no S3, no S4, no clicks, no rubs, 3 out of 6 diastolic murmur heard best at the left third intercostal space radiating to the axilla, no systolic murmurs ABD:  Flat, positive bowel sounds normal in frequency in pitch, no bruits, no rebound, no guarding, no midline pulsatile mass,  no hepatomegaly, no splenomegaly EXT:  2 plus pulses throughout, no edema, no cyanosis no clubbing SKIN:  No rashes no nodules NEURO:  Cranial nerves II through XII grossly intact, motor grossly intact throughout PSYCH:  Cognitively intact, oriented to person place and time    EKG:  EKG is ordered today. The ekg ordered today demonstrates sinus rhythm, rate 78, left axis deviation, voltage criteria for left ventricular hypertrophy, no acute ST-T wave changes.   Recent Labs: 02/12/2021: B Natriuretic Peptide 386.3; Hemoglobin 13.2; Platelets 252; TSH 1.655 02/14/2021: BUN 17; Creatinine, Ser 1.40; Potassium 4.1; Sodium 136    Lipid Panel    Component Value Date/Time   CHOL 192 12/29/2019 1141   TRIG 194 (H) 12/29/2019 1141   HDL 46 12/29/2019 1141   CHOLHDL 4.2 12/29/2019 1141   LDLCALC 112 (H) 12/29/2019 1141      Wt Readings from Last 3 Encounters:  11/04/21 (!) 357 lb 6.4 oz (162.1 kg)  08/09/21 (!) 359 lb (162.8 kg)  02/14/21 (!) 356 lb 12.8 oz (161.8 kg)      Other studies Reviewed: Additional studies/ records that were reviewed today include: Echo  Review of the above records demonstrates:  Please see elsewhere in the note.     A a gym membership and I thinkSSESSMENT AND PLAN:  HTN: Blood pressure is slightly above target.  We are going to try to send her a large blood pressure cuff and she is going to keep a blood pressure diary.  For now she will continue the meds as listed.  Hopefully with weight loss her blood pressure will come down.  MURMUR: She has diastolic murmur consistent with aortic insufficiency.  Interestingly there was no evidence of this on the previous echo.  I am going to check an echocardiogram.  Likely we can follow this with physical exams and echoes over the years.  I discussed with her the physiology and anatomy.  MORBID OBESITY: We talked about diet and exercise.  She has started doing exercise and sounds like she is on the right track.  I  encouraged more the same and she could be considered for Healthy Weight and Wellness in the future.  Current medicines are reviewed at length with the patient today.  The patient does not have concerns regarding medicines.  The following changes have been made:  no change  Labs/ tests ordered today include:   Orders Placed This Encounter  Procedures   Ambulatory referral to Social Work   EKG 12-Lead   ECHOCARDIOGRAM COMPLETE     Disposition:   FU with me in one year or sooner based on the results of the above testing   Signed, Minus Breeding, MD  11/04/2021 10:14 AM  [facility]

## 2021-11-04 ENCOUNTER — Ambulatory Visit (INDEPENDENT_AMBULATORY_CARE_PROVIDER_SITE_OTHER): Payer: Medicaid Other | Admitting: Cardiology

## 2021-11-04 ENCOUNTER — Encounter: Payer: Self-pay | Admitting: Cardiology

## 2021-11-04 ENCOUNTER — Other Ambulatory Visit: Payer: Self-pay

## 2021-11-04 VITALS — BP 142/78 | HR 78 | Ht 60.0 in | Wt 357.4 lb

## 2021-11-04 DIAGNOSIS — R011 Cardiac murmur, unspecified: Secondary | ICD-10-CM | POA: Diagnosis not present

## 2021-11-04 NOTE — Patient Instructions (Addendum)
Medication Instructions:  Your Physician recommend you continue on your current medication as directed.    *If you need a refill on your cardiac medications before your next appointment, please call your pharmacy*    Testing/Procedures: Your physician has requested that you have an echocardiogram. Echocardiography is a painless test that uses sound waves to create images of your heart. It provides your doctor with information about the size and shape of your heart and how well your heart's chambers and valves are working. This procedure takes approximately one hour. There are no restrictions for this procedure.    Follow-Up: At [facility], you and your health needs are our priority.  As part of our continuing mission to provide you with exceptional heart care, we have created designated Provider Care Teams.  These Care Teams include your primary Cardiologist (physician) and Advanced Practice Providers (APPs -  Physician Assistants and Nurse Practitioners) who all work together to provide you with the care you need, when you need it.  We recommend signing up for the patient portal called "MyChart".  Sign up information is provided on this After Visit Summary.  MyChart is used to connect with patients for Virtual Visits (Telemedicine).  Patients are able to view lab/test results, encounter notes, upcoming appointments, etc.  Non-urgent messages can be sent to your provider as well.   To learn more about what you can do with MyChart, go to [email or web address].    Your next appointment:   1 year(s)  The format for your next appointment:   In Person  Provider:   Dr.J. Hochrein, MD   Other Instructions Social Worker to contact you regarding blood pressure cuff. Keep a diary of your blood pressure. You may send the readings to [place] via mychart or FAX [phone].

## 2021-11-05 NOTE — Progress Notes (Signed)
Received referral for blood pressure cuff.  Requested RN make that referral to clinic leadership who are able to order supplies for pt since she does not have Medicare coverage/Medicaid only.   Westley Hummer, MSW, [place]  [phone]- work cell phone (preferred) [phone]- desk phone

## 2021-11-15 ENCOUNTER — Telehealth: Payer: Self-pay

## 2021-11-15 ENCOUNTER — Encounter: Payer: Self-pay | Admitting: Pulmonary Disease

## 2021-11-15 NOTE — Telephone Encounter (Signed)
Spoke with patient to inform her a b/p cuff is at the office for her. She stated she will come before 5 pm today to get it.

## 2021-11-18 ENCOUNTER — Ambulatory Visit (HOSPITAL_COMMUNITY): Payer: Medicaid Other | Attending: Internal Medicine

## 2021-11-18 ENCOUNTER — Other Ambulatory Visit: Payer: Self-pay

## 2021-11-18 DIAGNOSIS — R011 Cardiac murmur, unspecified: Secondary | ICD-10-CM | POA: Insufficient documentation

## 2021-11-18 LAB — ECHOCARDIOGRAM COMPLETE
AR max vel: 3.38 cm2
AV Area VTI: 3.75 cm2
AV Area mean vel: 3.34 cm2
AV Mean grad: 9 mmHg
AV Peak grad: 14.4 mmHg
Ao pk vel: 1.9 m/s
Area-P 1/2: 4.8 cm2
P 1/2 time: 231 msec
S' Lateral: 3.4 cm

## 2021-12-03 ENCOUNTER — Encounter: Payer: Self-pay | Admitting: *Deleted

## 2021-12-06 ENCOUNTER — Telehealth: Payer: Self-pay | Admitting: Cardiology

## 2021-12-06 NOTE — Telephone Encounter (Signed)
Called and spoke with the patient who stated that she was feeling good. She has been advised that we will reach out to Dr. Percival Spanish to see where she can be added in since he is fully booked. She does prefer before 9:30 if at all possible.    Minus Breeding, MD  11/26/2021 12:28 PM EST     I tried to call her with results.  She has a very leaky aortic valve and I would like to see her back in the office to discuss and review in person.  We will need to follow and decide next steps

## 2021-12-06 NOTE — Telephone Encounter (Signed)
Patient returning call for lab results and states she is supposed to be worked in for an appointment sooner than 3/24. She states to just schedule the appointment for her an leave the information the voicemail. She says she will try to have her daughter's phone on her: [phone].

## 2021-12-09 NOTE — Telephone Encounter (Signed)
Left a message for the patient to call back.  

## 2021-12-10 NOTE — Telephone Encounter (Signed)
Left a message for the patient to call back.  

## 2021-12-16 NOTE — Telephone Encounter (Signed)
Left a message for the patient to call back at both numbers provided. An appointment has been held for the patient on 3/23 at 8 am with Dr. Percival Spanish.

## 2021-12-19 NOTE — Telephone Encounter (Signed)
Appointment made, patient aware. ?

## 2022-01-08 NOTE — H&P (View-Only) (Signed)
,  ?  ?Cardiology Office Note ? ? ?Date:  01/09/2022  ? ?ID:  [name], DOB [birth year], MRN [id number] ? ?PCP:  Joycelyn Man, FNP  ?Cardiologist:   Minus Breeding, MD ?Referring:  Joycelyn Man, FNP ? ?Chief Complaint  ?Patient presents with  ? Heart Murmur  ? ? ?  ?History of Present Illness: ?Danielle Kent is a 36 y.o. female who presents for evaluation of a murmur.   She was referred by Joycelyn Man, FNP.  She has been noted to have a heart murmur.  She says this was mentioned 2 years ago when she had her last pregnancy but not really before this. An echo in 2020 demonstrated a well-preserved ejection fraction and did not suggest any valvular abnormalities. ? ?The patient presents for follow-up.  The echocardiogram demonstrated probable severe aortic insufficiency.  Could not exclude a bicuspid valve.  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  Pressure half-time was 231.  Mean gradient was 9.  Goes to the gym and denies any shortness of breath, PND or orthopnea.  She was not having any palpitations, presyncope or syncope.  She has not had any fevers chills or acute symptoms. ? ? ?Past Medical History:  ?Diagnosis Date  ? Asthma   ? Gestational diabetes 2020  ? Hypertension   ? ? ?Past Surgical History:  ?Procedure Laterality Date  ? CESAREAN SECTION    ? CESAREAN SECTION WITH BILATERAL TUBAL LIGATION N/A 04/03/2019  ? Procedure: CESAREAN SECTION WITH BILATERAL TUBAL LIGATION;  Surgeon: Florian Buff, MD;  Location: [facility];  Service: Obstetrics;  Laterality: N/A;  ? ? ? ?Current Outpatient Medications  ?Medication Sig Dispense Refill  ? amLODipine (NORVASC) 10 MG tablet Take 1 tablet (10 mg total) by mouth daily. 30 tablet 3  ? atorvastatin (LIPITOR) 40 MG tablet Take 40 mg by mouth daily.    ? hydrochlorothiazide (HYDRODIURIL) 25 MG tablet Take 1 tablet (25 mg total) by mouth daily. 30 tablet 3  ? labetalol (NORMODYNE) 200 MG tablet Take 1 tablet (200 mg total) by mouth 3 (three) times daily. 90 tablet 3   ? gabapentin (NEURONTIN) 100 MG capsule Take 600 mg by mouth daily. (Patient not taking: Reported on 01/09/2022)    ? losartan (COZAAR) 100 MG tablet Take 1 tablet (100 mg total) by mouth at bedtime. 90 tablet 3  ? Vitamin D, Ergocalciferol, (DRISDOL) 1.25 MG (50000 UNIT) CAPS capsule Take 50,000 Units by mouth once a week. (Patient not taking: Reported on 01/09/2022)    ? ?No current facility-administered medications for this visit.  ? ? ?Allergies:   Patient has no known allergies.  ? ? ?ROS:  Please see the history of present illness.   Otherwise, review of systems are positive for none.   All other systems are reviewed and negative.  ? ? ?PHYSICAL EXAM: ?VS:  BP (!) 150/96   Pulse 100   Ht 5' (1.524 m)   Wt (!) 355 lb 9.6 oz (161.3 kg)   SpO2 95%   BMI 69.45 kg/m?  , BMI Body mass index is 69.45 kg/m?. ?GENERAL:  Well appearing ?NECK:  No jugular venous distention, waveform within normal limits, carotid upstroke brisk and symmetric, no bruits, no thyromegaly ?LUNGS:  Clear to auscultation bilaterally ?CHEST:  Unremarkable ?HEART:  PMI not displaced or sustained,S1 and S2 within normal limits, no S3, no S4, no clicks, no rubs, 3 out of 6 diastolic murmur heard best at the third left intercostal  space but really over the entire precordium, no systolic murmurs murmurs ?ABD:  Flat, positive bowel sounds normal in frequency in pitch, no bruits, no rebound, no guarding, no midline pulsatile mass, no hepatomegaly, no splenomegaly ?EXT:  2 plus pulses throughout, no edema, no cyanosis no clubbing ? ?EKG:  EKG is  not ordered today. ? ? ?Recent Labs: ?02/12/2021: B Natriuretic Peptide 386.3; Hemoglobin 13.2; Platelets 252; TSH 1.655 ?02/14/2021: BUN 17; Creatinine, Ser 1.40; Potassium 4.1; Sodium 136  ? ? ?Lipid Panel ?   ?Component Value Date/Time  ? CHOL 192 12/29/2019 1141  ? TRIG 194 (H) 12/29/2019 1141  ? HDL 46 12/29/2019 1141  ? CHOLHDL 4.2 12/29/2019 1141  ? LDLCALC 112 (H) 12/29/2019 1141  ? ?  ? ?Wt Readings  from Last 3 Encounters:  ?01/09/22 (!) 355 lb 9.6 oz (161.3 kg)  ?11/04/21 (!) 357 lb 6.4 oz (162.1 kg)  ?08/09/21 (!) 359 lb (162.8 kg)  ?  ? ? ?Other studies Reviewed: ?Additional studies/ records that were reviewed today include: Echo  ?Review of the above records demonstrates:  Please see elsewhere in the note.   ? ? ?A a gym membership and I thinkSSESSMENT AND PLAN: ? ?HTN: Blood pressure is still elevated.  I am going to increase her Cozaar to 100 mg daily. ? ?AI:     The next step will be a transesophageal echocardiogram.  We could not see whether there are 2 leaflets that there have been 3 and a previous exam.  She does not have any symptoms consistent with an endocarditis.  I think this is just degenerative and yet healed to determine repair versus replacement.  At her age she would need mechanical replacement. ? ?MORBID OBESITY:     We again talked about goals of therapy for this.  ? ?Current medicines are reviewed at length with the patient today.  The patient does not have concerns regarding medicines. ? ?The following changes have been made: As above ? ?Labs/ tests ordered today include:   \  ? ?Orders Placed This Encounter  ?Procedures  ? Basic metabolic panel  ? CBC  ? ? ? ?Disposition:   FU with me after the TEE ? ?Signed, ?Minus Breeding, MD  ?01/09/2022 8:59 AM    ?[place] ? ? ? ?

## 2022-01-08 NOTE — Progress Notes (Signed)
,  ?  ?Cardiology Office Note ? ? ?Date:  01/09/2022  ? ?ID:  Danielle Kent, DOB 01-03-1986, MRN [id number] ? ?PCP:  Joycelyn Man, FNP  ?Cardiologist:   Minus Breeding, MD ?Referring:  Joycelyn Man, FNP ? ?Chief Complaint  ?Patient presents with  ? Heart Murmur  ? ? ?  ?History of Present Illness: ?Danielle Kent is a 36 y.o. female who presents for evaluation of a murmur.   She was referred by Joycelyn Man, FNP.  She has been noted to have a heart murmur.  She says this was mentioned 2 years ago when she had her last pregnancy but not really before this. An echo in 2020 demonstrated a well-preserved ejection fraction and did not suggest any valvular abnormalities. ? ?The patient presents for follow-up.  The echocardiogram demonstrated probable severe aortic insufficiency.  Could not exclude a bicuspid valve.  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  Pressure half-time was 231.  Mean gradient was 9.  Goes to the gym and denies any shortness of breath, PND or orthopnea.  She was not having any palpitations, presyncope or syncope.  She has not had any fevers chills or acute symptoms. ? ? ?Past Medical History:  ?Diagnosis Date  ? Asthma   ? Gestational diabetes 2020  ? Hypertension   ? ? ?Past Surgical History:  ?Procedure Laterality Date  ? CESAREAN SECTION    ? CESAREAN SECTION WITH BILATERAL TUBAL LIGATION N/A 04/03/2019  ? Procedure: CESAREAN SECTION WITH BILATERAL TUBAL LIGATION;  Surgeon: Florian Buff, MD;  Location: [facility];  Service: Obstetrics;  Laterality: N/A;  ? ? ? ?Current Outpatient Medications  ?Medication Sig Dispense Refill  ? amLODipine (NORVASC) 10 MG tablet Take 1 tablet (10 mg total) by mouth daily. 30 tablet 3  ? atorvastatin (LIPITOR) 40 MG tablet Take 40 mg by mouth daily.    ? hydrochlorothiazide (HYDRODIURIL) 25 MG tablet Take 1 tablet (25 mg total) by mouth daily. 30 tablet 3  ? labetalol (NORMODYNE) 200 MG tablet Take 1 tablet (200 mg total) by mouth 3 (three) times daily. 90 tablet 3   ? gabapentin (NEURONTIN) 100 MG capsule Take 600 mg by mouth daily. (Patient not taking: Reported on 01/09/2022)    ? losartan (COZAAR) 100 MG tablet Take 1 tablet (100 mg total) by mouth at bedtime. 90 tablet 3  ? Vitamin D, Ergocalciferol, (DRISDOL) 1.25 MG (50000 UNIT) CAPS capsule Take 50,000 Units by mouth once a week. (Patient not taking: Reported on 01/09/2022)    ? ?No current facility-administered medications for this visit.  ? ? ?Allergies:   Patient has no known allergies.  ? ? ?ROS:  Please see the history of present illness.   Otherwise, review of systems are positive for none.   All other systems are reviewed and negative.  ? ? ?PHYSICAL EXAM: ?VS:  BP (!) 150/96   Pulse 100   Ht 5' (1.524 m)   Wt (!) 355 lb 9.6 oz (161.3 kg)   SpO2 95%   BMI 69.45 kg/m?  , BMI Body mass index is 69.45 kg/m?. ?GENERAL:  Well appearing ?NECK:  No jugular venous distention, waveform within normal limits, carotid upstroke brisk and symmetric, no bruits, no thyromegaly ?LUNGS:  Clear to auscultation bilaterally ?CHEST:  Unremarkable ?HEART:  PMI not displaced or sustained,S1 and S2 within normal limits, no S3, no S4, no clicks, no rubs, 3 out of 6 diastolic murmur heard best at the third left intercostal  space but really over the entire precordium, no systolic murmurs murmurs ?ABD:  Flat, positive bowel sounds normal in frequency in pitch, no bruits, no rebound, no guarding, no midline pulsatile mass, no hepatomegaly, no splenomegaly ?EXT:  2 plus pulses throughout, no edema, no cyanosis no clubbing ? ?EKG:  EKG is  not ordered today. ? ? ?Recent Labs: ?02/12/2021: B Natriuretic Peptide 386.3; Hemoglobin 13.2; Platelets 252; TSH 1.655 ?02/14/2021: BUN 17; Creatinine, Ser 1.40; Potassium 4.1; Sodium 136  ? ? ?Lipid Panel ?   ?Component Value Date/Time  ? CHOL 192 12/29/2019 1141  ? TRIG 194 (H) 12/29/2019 1141  ? HDL 46 12/29/2019 1141  ? CHOLHDL 4.2 12/29/2019 1141  ? LDLCALC 112 (H) 12/29/2019 1141  ? ?  ? ?Wt Readings  from Last 3 Encounters:  ?01/09/22 (!) 355 lb 9.6 oz (161.3 kg)  ?11/04/21 (!) 357 lb 6.4 oz (162.1 kg)  ?08/09/21 (!) 359 lb (162.8 kg)  ?  ? ? ?Other studies Reviewed: ?Additional studies/ records that were reviewed today include: Echo  ?Review of the above records demonstrates:  Please see elsewhere in the note.   ? ? ?A a gym membership and I thinkSSESSMENT AND PLAN: ? ?HTN: Blood pressure is still elevated.  I am going to increase her Cozaar to 100 mg daily. ? ?AI:     The next step will be a transesophageal echocardiogram.  We could not see whether there are 2 leaflets that there have been 3 and a previous exam.  She does not have any symptoms consistent with an endocarditis.  I think this is just degenerative and yet healed to determine repair versus replacement.  At her age she would need mechanical replacement. ? ?MORBID OBESITY:     We again talked about goals of therapy for this.  ? ?Current medicines are reviewed at length with the patient today.  The patient does not have concerns regarding medicines. ? ?The following changes have been made: As above ? ?Labs/ tests ordered today include:   \  ? ?Orders Placed This Encounter  ?Procedures  ? Basic metabolic panel  ? CBC  ? ? ? ?Disposition:   FU with me after the TEE ? ?Signed, ?Minus Breeding, MD  ?01/09/2022 8:59 AM    ?[place] ? ? ? ?

## 2022-01-09 ENCOUNTER — Encounter: Payer: Self-pay | Admitting: Cardiology

## 2022-01-09 ENCOUNTER — Other Ambulatory Visit: Payer: Self-pay | Admitting: Cardiology

## 2022-01-09 ENCOUNTER — Ambulatory Visit (INDEPENDENT_AMBULATORY_CARE_PROVIDER_SITE_OTHER): Payer: Medicaid Other | Admitting: Cardiology

## 2022-01-09 ENCOUNTER — Other Ambulatory Visit: Payer: Self-pay

## 2022-01-09 VITALS — BP 150/96 | HR 100 | Ht 60.0 in | Wt 355.6 lb

## 2022-01-09 DIAGNOSIS — R011 Cardiac murmur, unspecified: Secondary | ICD-10-CM | POA: Diagnosis not present

## 2022-01-09 DIAGNOSIS — I10 Essential (primary) hypertension: Secondary | ICD-10-CM

## 2022-01-09 DIAGNOSIS — Z01812 Encounter for preprocedural laboratory examination: Secondary | ICD-10-CM

## 2022-01-09 DIAGNOSIS — R0683 Snoring: Secondary | ICD-10-CM

## 2022-01-09 MED ORDER — LOSARTAN POTASSIUM 100 MG PO TABS: 100.0000 mg | ORAL_TABLET | Freq: Every day | ORAL | 3 refills | Status: DC

## 2022-01-09 NOTE — Patient Instructions (Signed)
Medication Instructions:  ?INCREASE losartan to 100 mg daily ? ?*If you need a refill on your cardiac medications before your next appointment, please call your pharmacy* ? ? ?Lab Work: ?Please return for labs 1 week prior to procedure (BMET, CBC) ? ?Our in office lab hours are Monday-Friday 8:00-4:00, closed for lunch 12:45-1:45 pm.  No appointment needed. ? ?Testing/Procedures: ?Your physician has requested that you have a TEE. During a TEE, sound waves are used to create images of your heart. It provides your doctor with information about the size and shape of your heart and how well your heart?s chambers and valves are working. In this test, a transducer is attached to the end of a flexible tube that?s guided down your throat and into your esophagus (the tube leading from you mouth to your stomach) to get a more detailed image of your heart. You are not awake for the procedure. Please see the instruction sheet given to you today. For further information please visit [email or web address]. ? ?Follow-Up: ?At [facility], you and your health needs are our priority.  As part of our continuing mission to provide you with exceptional heart care, we have created designated Provider Care Teams.  These Care Teams include your primary Cardiologist (physician) and Advanced Practice Providers (APPs -  Physician Assistants and Nurse Practitioners) who all work together to provide you with the care you need, when you need it. ? ?We recommend signing up for the patient portal called "MyChart".  Sign up information is provided on this After Visit Summary.  MyChart is used to connect with patients for Virtual Visits (Telemedicine).  Patients are able to view lab/test results, encounter notes, upcoming appointments, etc.  Non-urgent messages can be sent to your provider as well.   ?To learn more about what you can do with MyChart, go to [email or web address].   ? ?Your next appointment:   ?4/21 at 9:20 AM with Dr.  Percival Spanish ? ?Other Instructions ? ?You are scheduled for a TEE on Tuesday, April 11th with Dr. Sallyanne Kuster.  Please arrive at the [facility] (Main Entrance A) at [facility]: [address] [place], [place] [address] at 11 AM. (1 hour prior to procedure unless lab work is needed; if lab work is needed arrive 1.5 hours ahead) ? ?DIET: Nothing to eat or drink after midnight except a sip of water with medications (see medication instructions below) ? ?FYI: For your safety, and to allow [place] to monitor your vital signs accurately during the surgery/procedure we request that   ?if you have artificial nails, gel coating, SNS etc. Please have those removed prior to your surgery/procedure. Not having the nail coverings /polish removed may result in cancellation or delay of your surgery/procedure. ? ?Labs: BMET, CBC 1 week prior ? ?You must have a responsible person to drive you home and stay in the waiting area during your procedure. Failure to do so could result in cancellation. ? ?Interior and spatial designer cards. ? ?*Special Note: Every effort is made to have your procedure done on time. Occasionally there are emergencies that occur at the hospital that may cause delays. Please be patient if a delay does occur. 4/ ?

## 2022-01-20 ENCOUNTER — Encounter (HOSPITAL_COMMUNITY): Payer: Self-pay | Admitting: Cardiovascular Disease

## 2022-01-24 LAB — BASIC METABOLIC PANEL
BUN/Creatinine Ratio: 14 (ref 9–23)
BUN: 17 mg/dL (ref 6–20)
CO2: 25 mmol/L (ref 20–29)
Calcium: 9.1 mg/dL (ref 8.7–10.2)
Chloride: 99 mmol/L (ref 96–106)
Creatinine, Ser: 1.21 mg/dL — ABNORMAL HIGH (ref 0.57–1.00)
Glucose: 99 mg/dL (ref 70–99)
Potassium: 3.9 mmol/L (ref 3.5–5.2)
Sodium: 142 mmol/L (ref 134–144)
eGFR: 60 mL/min/{1.73_m2} (ref >59)

## 2022-01-24 LAB — CBC
Hematocrit: 39.8 % (ref 34.0–46.6)
Hemoglobin: 12.9 g/dL (ref 11.1–15.9)
MCH: 26.8 pg (ref 26.6–33.0)
MCHC: 32.4 g/dL (ref 31.5–35.7)
MCV: 83 fL (ref 79–97)
Platelets: 346 10*3/uL (ref 150–450)
RBC: 4.82 x10E6/uL (ref 3.77–5.28)
RDW: 13.2 % (ref 11.7–15.4)
WBC: 7.5 10*3/uL (ref 3.4–10.8)

## 2022-01-27 ENCOUNTER — Other Ambulatory Visit: Payer: Self-pay | Admitting: *Deleted

## 2022-01-27 DIAGNOSIS — I351 Nonrheumatic aortic (valve) insufficiency: Secondary | ICD-10-CM

## 2022-01-28 ENCOUNTER — Other Ambulatory Visit: Payer: Self-pay

## 2022-01-28 ENCOUNTER — Ambulatory Visit (HOSPITAL_BASED_OUTPATIENT_CLINIC_OR_DEPARTMENT_OTHER)
Admission: RE | Admit: 2022-01-28 | Discharge: 2022-01-28 | Disposition: A | Discharge status: Home / Self Care | Payer: Medicaid Other | Source: Ambulatory Visit | Attending: Cardiovascular Disease | Admitting: Cardiovascular Disease

## 2022-01-28 ENCOUNTER — Encounter: Payer: Self-pay | Admitting: *Deleted

## 2022-01-28 ENCOUNTER — Encounter (HOSPITAL_COMMUNITY)
Admission: RE | Disposition: A | Discharge status: Home / Self Care | Payer: Self-pay | Source: Ambulatory Visit | Attending: Cardiovascular Disease

## 2022-01-28 ENCOUNTER — Ambulatory Visit (HOSPITAL_COMMUNITY): Payer: Medicaid Other | Admitting: General Practice

## 2022-01-28 ENCOUNTER — Ambulatory Visit (HOSPITAL_BASED_OUTPATIENT_CLINIC_OR_DEPARTMENT_OTHER): Payer: Medicaid Other | Admitting: General Practice

## 2022-01-28 ENCOUNTER — Encounter (HOSPITAL_COMMUNITY): Payer: Self-pay | Admitting: Cardiovascular Disease

## 2022-01-28 ENCOUNTER — Ambulatory Visit (HOSPITAL_COMMUNITY)
Admission: RE | Admit: 2022-01-28 | Discharge: 2022-01-28 | Disposition: A | Discharge status: Home / Self Care | Payer: Medicaid Other | Source: Ambulatory Visit | Attending: Cardiovascular Disease | Admitting: Cardiovascular Disease

## 2022-01-28 DIAGNOSIS — I509 Heart failure, unspecified: Secondary | ICD-10-CM

## 2022-01-28 DIAGNOSIS — Z79899 Other long term (current) drug therapy: Secondary | ICD-10-CM | POA: Diagnosis not present

## 2022-01-28 DIAGNOSIS — I351 Nonrheumatic aortic (valve) insufficiency: Secondary | ICD-10-CM | POA: Diagnosis present

## 2022-01-28 DIAGNOSIS — E119 Type 2 diabetes mellitus without complications: Secondary | ICD-10-CM

## 2022-01-28 DIAGNOSIS — I110 Hypertensive heart disease with heart failure: Secondary | ICD-10-CM | POA: Diagnosis not present

## 2022-01-28 DIAGNOSIS — Z6841 Body Mass Index (BMI) 40.0 and over, adult: Secondary | ICD-10-CM | POA: Insufficient documentation

## 2022-01-28 DIAGNOSIS — G4733 Obstructive sleep apnea (adult) (pediatric): Secondary | ICD-10-CM | POA: Insufficient documentation

## 2022-01-28 DIAGNOSIS — I350 Nonrheumatic aortic (valve) stenosis: Secondary | ICD-10-CM

## 2022-01-28 DIAGNOSIS — I119 Hypertensive heart disease without heart failure: Secondary | ICD-10-CM | POA: Insufficient documentation

## 2022-01-28 HISTORY — PX: TEE WITHOUT CARDIOVERSION: SHX5443

## 2022-01-28 LAB — ECHO TEE
AR max vel: 2.71 cm2
AV Area VTI: 2.87 cm2
AV Area mean vel: 2.4 cm2
AV Mean grad: 2.5 mmHg
AV Peak grad: 4.6 mmHg
Ao pk vel: 1.07 m/s
P 1/2 time: 252 msec

## 2022-01-28 SURGERY — ECHOCARDIOGRAM, TRANSESOPHAGEAL
Anesthesia: General

## 2022-01-28 MED ORDER — DEXMEDETOMIDINE (PRECEDEX) IN NS 20 MCG/5ML (4 MCG/ML) IV SYRINGE
PREFILLED_SYRINGE | INTRAVENOUS | Status: DC | PRN
  Administered 2022-01-28: 8 ug via INTRAVENOUS
  Administered 2022-01-28: 12 ug via INTRAVENOUS

## 2022-01-28 MED ORDER — PROPOFOL 500 MG/50ML IV EMUL
INTRAVENOUS | Status: DC | PRN
  Administered 2022-01-28: 100 ug/kg/min via INTRAVENOUS

## 2022-01-28 MED ORDER — LACTATED RINGERS IV SOLN
INTRAVENOUS | Status: AC | PRN
  Administered 2022-01-28: 1000 mL via INTRAVENOUS

## 2022-01-28 MED ORDER — LIDOCAINE 2% (20 MG/ML) 5 ML SYRINGE
INTRAMUSCULAR | Status: DC | PRN
  Administered 2022-01-28: 100 mg via INTRAVENOUS

## 2022-01-28 MED ORDER — GLYCOPYRROLATE PF 0.2 MG/ML IJ SOSY
PREFILLED_SYRINGE | INTRAMUSCULAR | Status: DC | PRN
  Administered 2022-01-28: .2 mg via INTRAVENOUS

## 2022-01-28 MED ORDER — FENTANYL CITRATE (PF) 100 MCG/2ML IJ SOLN
INTRAMUSCULAR | Status: AC
  Filled 2022-01-28: qty 2

## 2022-01-28 MED ORDER — PROPOFOL 10 MG/ML IV BOLUS
INTRAVENOUS | Status: DC | PRN
  Administered 2022-01-28: 200 mg via INTRAVENOUS
  Administered 2022-01-28 (×2): 20 mg via INTRAVENOUS

## 2022-01-28 MED ORDER — SODIUM CHLORIDE 0.9 % IV SOLN: INTRAVENOUS | Status: DC

## 2022-01-28 MED ORDER — SUCCINYLCHOLINE CHLORIDE 200 MG/10ML IV SOSY
PREFILLED_SYRINGE | INTRAVENOUS | Status: DC | PRN
  Administered 2022-01-28: 140 mg via INTRAVENOUS

## 2022-01-28 NOTE — Anesthesia Postprocedure Evaluation (Signed)
Anesthesia Post Note ? ?Patient: Danielle Kent ? ?Procedure(s) Performed: TRANSESOPHAGEAL ECHOCARDIOGRAM (TEE) ? ?  ? ?Patient location during evaluation: PACU ?Anesthesia Type: General ?Level of consciousness: sedated and patient cooperative ?Pain management: pain level controlled ?Vital Signs Assessment: post-procedure vital signs reviewed and stable ?Respiratory status: spontaneous breathing ?Cardiovascular status: stable ?Anesthetic complications: no ? ? ?No notable events documented. ? ?Last Vitals:  ?Vitals:  ? 01/28/22 1311 01/28/22 1321  ?BP: 131/66 (!) 139/94  ?Pulse: 87 89  ?Resp: (!) 21 17  ?Temp:    ?SpO2: 93% 94%  ?  ?Last Pain:  ?Vitals:  ? 01/28/22 1321  ?TempSrc:   ?PainSc: 0-No pain  ? ? ?  ?  ?  ?  ?  ?  ? ?Nolon Nations ? ? ? ? ?

## 2022-01-28 NOTE — Discharge Instructions (Signed)

## 2022-01-28 NOTE — Op Note (Signed)
INDICATIONS: ?Aortic insufficiency ? ?PROCEDURE:  ? ?Informed consent was obtained prior to the procedure. The risks, benefits and alternatives for the procedure were discussed and the patient comprehended these risks.  Risks include, but are not limited to, cough, sore throat, vomiting, nausea, somnolence, esophageal and stomach trauma or perforation, bleeding, low blood pressure, aspiration, pneumonia, infection, trauma to the teeth and death.   ? ?After a procedural time-out, the oropharynx was anesthetized with 20% benzocaine spray.  ? ?During this procedure the patient underwent endotracheal intubation and general anesthesia due to hypoventilation/obstructive apnea (Dr. Lissa Hoard). ? ?The transesophageal probe was inserted in the esophagus and stomach without difficulty and multiple views were obtained.  The patient was kept under observation until the patient left the procedure room.  The patient left the procedure room in stable condition.  ? ?Agitated microbubble saline contrast was not administered. ? ?COMPLICATIONS:   ? ?There were no immediate complications. ? ?FINDINGS:  ?Tri-leaflet aortic valve with severe aortic insufficiency. 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 Consider sequelae of endocarditis. ?There is holodiastolic flow reversal in the descending  ?There is no aortic stenosis, no aortic root dilation and no aortic coarctation. ?Otherwise normal exam with normal left ventricular size and function and no significant abnormalities of the other valves. ? ?RECOMMENDATIONS:   ? ?Monitor for development of left ventricular dilation and/or heart failure.She will require aortic valve replacement in the future, when these abnormalities occur. Ideally, would lose substantial weight before cardiac surgery. ? ?Needs to complete evaluation and start treatment for OSA. ? ?Time Spent Directly with the  Patient: ? ?45 minutes  ? ?Danielle Kent ?01/28/2022, 12:28 PM ? ?

## 2022-01-28 NOTE — Anesthesia Procedure Notes (Signed)
Procedure Name: Intubation ?Date/Time: 01/28/2022 12:12 PM ?Performed by: Dorann Lodge, CRNA ?Pre-anesthesia Checklist: Patient identified, Emergency Drugs available, Suction available and Patient being monitored ?Patient Re-evaluated:Patient Re-evaluated prior to induction ?Oxygen Delivery Method: Circle System Utilized ?Preoxygenation: Pre-oxygenation with 100% oxygen ?Induction Type: IV induction ?Ventilation: Oral airway inserted - appropriate to patient size and Mask ventilation without difficulty ?Laryngoscope Size: Glidescope and 3 ?Grade View: Grade I ?Tube type: Oral ?Tube size: 7.5 mm ?Number of attempts: 1 ?Airway Equipment and Method: Stylet, Oral airway and Video-laryngoscopy ?Placement Confirmation: ETT inserted through vocal cords under direct vision, positive ETCO2 and breath sounds checked- equal and bilateral ?Secured at: 23 cm ?Tube secured with: Tape ?Dental Injury: Teeth and Oropharynx as per pre-operative assessment  ? ? ? ? ?

## 2022-01-28 NOTE — Transfer of Care (Signed)
Immediate Anesthesia Transfer of Care Note ? ?Patient: Danielle Kent ? ?Procedure(s) Performed: TRANSESOPHAGEAL ECHOCARDIOGRAM (TEE) ? ?Patient Location: Endoscopy Unit ? ?Anesthesia Type:General ? ?Level of Consciousness: awake and drowsy ? ?Airway & Oxygen Therapy: Patient Spontanous Breathing and Patient connected to nasal cannula oxygen ? ?Post-op Assessment: Report given to RN and Post -op Vital signs reviewed and stable ? ?Post vital signs: Reviewed and stable ? ?Last Vitals:  ?Vitals Value Taken Time  ?BP 145/72 01/28/22 1238  ?Temp    ?Pulse 95 01/28/22 1240  ?Resp 21 01/28/22 1240  ?SpO2 94 % 01/28/22 1240  ?Vitals shown include unvalidated device data. ? ?Last Pain:  ?Vitals:  ? 01/28/22 1145  ?TempSrc: Temporal  ?PainSc: 0-No pain  ?   ? ?  ? ?Complications: No notable events documented. ?

## 2022-01-28 NOTE — Progress Notes (Signed)
?  Echocardiogram ?Echocardiogram Transesophageal has been performed. ? ?Fidel Levy ?01/28/2022, 12:49 PM ?

## 2022-01-28 NOTE — Anesthesia Preprocedure Evaluation (Addendum)
Anesthesia Evaluation  ?Patient identified by MRN, date of birth, ID band ?Patient awake ? ? ? ?Reviewed: ?Allergy & Precautions, NPO status , Patient's Chart, lab work & pertinent test results ? ?History of Anesthesia Complications ?(+) POST - OP SPINAL HEADACHE and history of anesthetic complications ? ?Airway ?Mallampati: III ? ?TM Distance: >3 FB ?Neck ROM: Full ? ? ? Dental ? ?(+) Dental Advisory Given, Teeth Intact ?  ?Pulmonary ?asthma ,  ?  ?Pulmonary exam normal ?breath sounds clear to auscultation ? ? ? ? ? ? Cardiovascular ?hypertension, Pt. on medications and Pt. on home beta blockers ?+CHF  ?+ Valvular Problems/Murmurs AI  ?Rhythm:Regular Rate:Normal ?+ Diastolic murmurs ?Echo 12/2021 ??1. The aortic valve was not well visualized. Aortic valve regurgitation is severe. No aortic stenosis is present. Aortic regurgitation appears primary in morphology. There is some diastoloic flow revereal in the descending aorta.  ??2. Left ventricular ejection fraction, by estimation, is 60 to 65%. The left ventricle has normal function. The left ventricle has no regional wall motion abnormalities. There is moderate concentric left ventricular hypertrophy. Left ventricular diastolic parameters were normal. The average left ventricular global longitudinal strain is -23.2%. The global longitudinal strain is normal.  ??3. Right ventricular systolic function is normal. The right ventricular size is normal. Tricuspid regurgitation signal is inadequate for assessing PA pressure.  ??4. The mitral valve is normal in structure. No evidence of mitral valve regurgitation. No evidence of mitral stenosis.  ? ?Comparison(s): 11/26/18 EF 55-60%. Significant changes have occured: no significant aortic regurgitation seein in 2020 study.  ?  ?Neuro/Psych ?  ? GI/Hepatic ?  ?Endo/Other  ?diabetes, Type 2Morbid obesity ? Renal/GU ?Renal disease  ? ?  ?Musculoskeletal ? ? Abdominal ?(+) + obese,   ?Peds ?  Hematology ?  ?Anesthesia Other Findings ? ? Reproductive/Obstetrics ? ?  ? ? ? ? ? ? ? ? ? ? ? ? ? ?  ?  ? ? ? ? ? ? ? ?Anesthesia Physical ? ?Anesthesia Plan ? ?ASA: 4 ? ?Anesthesia Plan: MAC  ? ?Post-op Pain Management: Minimal or no pain anticipated  ? ?Induction: Intravenous ? ?PONV Risk Score and Plan: 3 and Ondansetron, Dexamethasone, Midazolam and Treatment may vary due to age or medical condition ? ?Airway Management Planned: Natural Airway ? ?Additional Equipment:  ? ?Intra-op Plan:  ? ?Post-operative Plan:  ? ?Informed Consent: I have reviewed the patients History and Physical, chart, labs and discussed the procedure including the risks, benefits and alternatives for the proposed anesthesia with the patient or authorized representative who has indicated his/her understanding and acceptance.  ? ? ? ?Dental advisory given ? ?Plan Discussed with: CRNA ? ?Anesthesia Plan Comments: ( Plan attempt sedation with GA as likely backup.)  ? ? ? ? ? ?Anesthesia Quick Evaluation ? ?

## 2022-01-28 NOTE — Interval H&P Note (Signed)
History and Physical Interval Note: ? ?01/28/2022 ?11:11 AM ? ?Danielle Kent  has presented today for surgery, with the diagnosis of AORTIC INSUFFICIENCY.  The various methods of treatment have been discussed with the patient and family. After consideration of risks, benefits and other options for treatment, the patient has consented to  Procedure(s): ?TRANSESOPHAGEAL ECHOCARDIOGRAM (TEE) (N/A) as a surgical intervention.  The patient's history has been reviewed, patient examined, no change in status, stable for surgery.  I have reviewed the patient's chart and labs.  Questions were answered to the patient's satisfaction.   ? ? ?Danielle Kent ? ? ?

## 2022-01-29 ENCOUNTER — Encounter (HOSPITAL_COMMUNITY): Payer: Self-pay | Admitting: Cardiovascular Disease

## 2022-02-05 ENCOUNTER — Ambulatory Visit (HOSPITAL_BASED_OUTPATIENT_CLINIC_OR_DEPARTMENT_OTHER): Payer: Medicaid Other | Attending: Cardiology | Admitting: Cardiovascular Disease

## 2022-02-05 DIAGNOSIS — G4736 Sleep related hypoventilation in conditions classified elsewhere: Secondary | ICD-10-CM | POA: Diagnosis not present

## 2022-02-05 DIAGNOSIS — G4733 Obstructive sleep apnea (adult) (pediatric): Secondary | ICD-10-CM | POA: Diagnosis not present

## 2022-02-05 DIAGNOSIS — I10 Essential (primary) hypertension: Secondary | ICD-10-CM | POA: Insufficient documentation

## 2022-02-05 DIAGNOSIS — Z6841 Body Mass Index (BMI) 40.0 and over, adult: Secondary | ICD-10-CM | POA: Insufficient documentation

## 2022-02-05 DIAGNOSIS — R0683 Snoring: Secondary | ICD-10-CM | POA: Insufficient documentation

## 2022-02-06 NOTE — Progress Notes (Signed)
,  ?  ?Cardiology Office Note ? ? ?Date:  02/07/2022  ? ?ID:  Danielle Kent, DOB 05-Jan-1986, MRN [id number] ? ?PCP:  Joycelyn Man, FNP  ?Cardiologist:   Minus Breeding, MD ?Referring:  Joycelyn Man, FNP ? ?Chief Complaint  ?Patient presents with  ? Aortic Valve Disease  ? ? ?  ?History of Present Illness: ?Danielle Kent is a 36 y.o. female who presents for evaluation of a murmur.   She was referred by Joycelyn Man, FNP.  She has been noted to have a heart murmur.  She says this was mentioned 2 years ago when she had her last pregnancy but not really before this. An echo in 2020 demonstrated a well-preserved ejection fraction and did not suggest any valvular abnormalities. Since I last saw her she had TEE demonstrating severe AI but normal LV size and function. ? ?She returns to talk about this.  She has no symptoms.  She denies any new shortness of breath, PND or orthopnea.  She has no palpitations, presyncope or syncope.  She has no weight gain or edema but she has not been able to lose weight yet.   ? ? ?Past Medical History:  ?Diagnosis Date  ? Asthma   ? Gestational diabetes 2020  ? Hypertension   ? ? ?Past Surgical History:  ?Procedure Laterality Date  ? CESAREAN SECTION    ? CESAREAN SECTION WITH BILATERAL TUBAL LIGATION N/A 04/03/2019  ? Procedure: CESAREAN SECTION WITH BILATERAL TUBAL LIGATION;  Surgeon: Florian Buff, MD;  Location: [facility];  Service: Obstetrics;  Laterality: N/A;  ? TEE WITHOUT CARDIOVERSION N/A 01/28/2022  ? Procedure: TRANSESOPHAGEAL ECHOCARDIOGRAM (TEE);  Surgeon: Sanda Klein, MD;  Location: [facility];  Service: Cardiovascular;  Laterality: N/A;  ? ? ? ?Current Outpatient Medications  ?Medication Sig Dispense Refill  ? amLODipine (NORVASC) 10 MG tablet Take 1 tablet (10 mg total) by mouth daily. 30 tablet 3  ? atorvastatin (LIPITOR) 40 MG tablet Take 40 mg by mouth daily.    ? hydrochlorothiazide (HYDRODIURIL) 25 MG tablet Take 1 tablet (25 mg total) by mouth daily. 30 tablet 3  ?  labetalol (NORMODYNE) 200 MG tablet Take 1 tablet (200 mg total) by mouth 3 (three) times daily. 90 tablet 3  ? losartan (COZAAR) 100 MG tablet Take 1 tablet (100 mg total) by mouth at bedtime. 90 tablet 3  ? ?No current facility-administered medications for this visit.  ? ? ?Allergies:   Patient has no known allergies.  ? ? ?ROS:  Please see the history of present illness.   Otherwise, review of systems are positive for none.   All other systems are reviewed and negative.  ? ? ?PHYSICAL EXAM: ?VS:  BP 120/70   Pulse 84   Ht 5' (1.524 m)   Wt (!) 362 lb 3.2 oz (164.3 kg)   LMP 01/09/2022   SpO2 93%   BMI 70.74 kg/m?  , BMI Body mass index is 70.74 kg/m?. ?GENERAL:  Well appearing ?NECK:  No jugular venous distention, waveform within normal limits, carotid upstroke brisk and symmetric, no bruits, no thyromegaly ?LUNGS:  Clear to auscultation bilaterally ?CHEST:  Unremarkable ?HEART:  PMI not displaced or sustained,S1 and S2 within normal limits, no S3, no S4, no clicks, no rubs, 3 out of 6 diastolic murmur heard best at the third left intercostal space, no systolic murmurs ?ABD:  Flat, positive bowel sounds normal in frequency in pitch, no bruits, no rebound, no guarding, no midline  pulsatile mass, no hepatomegaly, no splenomegaly ?EXT:  2 plus pulses throughout, no edema, no cyanosis no clubbing ? ? ?EKG:  EKG is  not  ordered today. ? ? ?Recent Labs: ?02/12/2021: B Natriuretic Peptide 386.3; TSH 1.655 ?01/24/2022: BUN 17; Creatinine, Ser 1.21; Hemoglobin 12.9; Platelets 346; Potassium 3.9; Sodium 142  ? ? ?Lipid Panel ?   ?Component Value Date/Time  ? CHOL 192 12/29/2019 1141  ? TRIG 194 (H) 12/29/2019 1141  ? HDL 46 12/29/2019 1141  ? CHOLHDL 4.2 12/29/2019 1141  ? LDLCALC 112 (H) 12/29/2019 1141  ? ?  ? ?Wt Readings from Last 3 Encounters:  ?02/07/22 (!) 362 lb 3.2 oz (164.3 kg)  ?02/05/22 (!) 380 lb (172.4 kg)  ?01/28/22 (!) 355 lb 9.6 oz (161.3 kg)  ?  ?TEE ?Tri-leaflet aortic valve with severe aortic  insufficiency. 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 Consider sequelae of endocarditis. ?There is holodiastolic flow reversal in the descending  ?There is no aortic stenosis, no aortic root dilation and no aortic coarctation. ?Otherwise normal exam with normal left ventricular size and function and no significant abnormalities of the other valves. ? ?Other studies Reviewed: ?Additional studies/ records that were reviewed today include: TEE  ?Review of the above records demonstrates:  Please see elsewhere in the note.   ? ? ?A a gym membership and I thinkSSESSMENT AND PLAN: ? ?HTN:    Controlled at home.  I will continue the meds as listed.  At the last visit I increased her Cozaar. ? ?AI:    This is severe with normal LV size and function.  We had a discussion about this and she will eventually need valve replacement.  However, need to watch her left ventricular function and size.  I am going to very much want to have her lose weight prior to the surgery.  T ? ?MORBID OBESITY:    We talked about this.  I am going to get her into [facility] she might be a very good candidate for [facility] ? ?Current medicines are reviewed at length with the patient today.  The patient does not have concerns regarding medicines. ? ?The following changes have been made: None ? ?Labs/ tests ordered today include:    ? ?Orders Placed This Encounter  ?Procedures  ? Amb Ref to Medical Weight Management  ? ECHOCARDIOGRAM COMPLETE  ? ? ?Disposition:   FU with me in 6 months.   ? ?Signed, ?Minus Breeding, MD  ?02/07/2022 10:09 AM    ?[place] ? ? ? ?

## 2022-02-07 ENCOUNTER — Encounter: Payer: Self-pay | Admitting: Cardiology

## 2022-02-07 ENCOUNTER — Ambulatory Visit (INDEPENDENT_AMBULATORY_CARE_PROVIDER_SITE_OTHER): Payer: Medicaid Other | Admitting: Cardiology

## 2022-02-07 VITALS — BP 120/70 | HR 84 | Ht 60.0 in | Wt 362.2 lb

## 2022-02-07 DIAGNOSIS — I10 Essential (primary) hypertension: Secondary | ICD-10-CM | POA: Diagnosis not present

## 2022-02-07 DIAGNOSIS — I351 Nonrheumatic aortic (valve) insufficiency: Secondary | ICD-10-CM

## 2022-02-07 NOTE — Patient Instructions (Addendum)
Medication Instructions:  ?No changes ?*If you need a refill on your cardiac medications before your next appointment, please call your pharmacy* ? ? ?Lab Work: ?None ordered ?If you have labs (blood work) drawn today and your tests are completely normal, you will receive your results only by: ?MyChart Message (if you have MyChart) OR ?A paper copy in the mail ?If you have any lab test that is abnormal or we need to change your treatment, we will call you to review the results. ? ? ?Testing/Procedures: ?Your physician has requested that you have an echocardiogram in October. Echocardiography is a painless test that uses sound waves to create images of your heart. It provides your doctor with information about the size and shape of your heart and how well your heart?s chambers and valves are working. You may receive an ultrasound enhancing agent through an IV if needed to better visualize your heart during the echo.This procedure takes approximately one hour. There are no restrictions for this procedure. This will take place at the [address].  ? ? ? ?Follow-Up: ?At [facility], you and your health needs are our priority.  As part of our continuing mission to provide you with exceptional heart care, we have created designated Provider Care Teams.  These Care Teams include your primary Cardiologist (physician) and Advanced Practice Providers (APPs -  Physician Assistants and Nurse Practitioners) who all work together to provide you with the care you need, when you need it. ? ?We recommend signing up for the patient portal called "MyChart".  Sign up information is provided on this After Visit Summary.  MyChart is used to connect with patients for Virtual Visits (Telemedicine).  Patients are able to view lab/test results, encounter notes, upcoming appointments, etc.  Non-urgent messages can be sent to your provider as well.   ?To learn more about what you can do with MyChart, go to  [email or web address].   ? ?Your next appointment:   ?6 month(s) ? ?The format for your next appointment:   ?In Person ? ?Provider:   ?Minus Breeding, MD { ? ?A referral has been placed to [facility]. ? ?Important Information About Sugar ? ? ? ? ? ? ?

## 2022-02-12 ENCOUNTER — Encounter (HOSPITAL_BASED_OUTPATIENT_CLINIC_OR_DEPARTMENT_OTHER): Payer: Self-pay | Admitting: Cardiovascular Disease

## 2022-02-12 NOTE — Procedures (Signed)
? ? ? ? ?  Patient Name: Danielle Kent, Danielle Kent ?Study Date: 02/05/2022 ?Gender: Female ?D.O.B: July 20, 1986 ?Age (years): 41 ?Referring Provider: Minus Breeding ?Height (inches): 60 ?Interpreting Physician: Shelva Majestic MD, ABSM ?Weight (lbs): 380 ?RPSGT: Gerhard Perches ?BMI: 74 ?MRN: [id number] ?Neck Size: 16.50 ? ?CLINICAL INFORMATION ?Sleep Study Type: HST ? ?Indication for sleep study: snoring, morbid obesity, fatigue, hypertension ? ?Epworth Sleepiness Score: 7 ? ?SLEEP STUDY TECHNIQUE ?A multi-channel overnight portable sleep study was performed. The channels recorded were: nasal airflow, thoracic respiratory movement, and oxygen saturation with a pulse oximetry. Snoring was also monitored. ? ?MEDICATIONS ?amLODipine (NORVASC) 10 MG tablet ?atorvastatin (LIPITOR) 40 MG tablet ?hydrochlorothiazide (HYDRODIURIL) 25 MG tablet ?labetalol (NORMODYNE) 200 MG tablet ?losartan (COZAAR) 100 MG tablet  ?Patient self administered medications include: N/A. ? ?SLEEP ARCHITECTURE ?Patient was studied for 298.7 minutes. The sleep efficiency was 99.9 % and the patient was supine for 51.2%. The arousal index was 0.0 per hour. ? ?RESPIRATORY PARAMETERS ?The overall AHI was 65.7 per hour, with a central apnea index of 0 per hour. ? ?The oxygen nadir was 70% during sleep. Time spent < 89% was 29.1 minutes. ? ?CARDIAC DATA ?Mean heart rate during sleep was 76.6 bpm. HR range: 63 -96. ? ?IMPRESSIONS ?- Severe obstructive sleep apnea occurred during this study (AHI  65.7/h). ?- Severe oxygen desaturation to a nadir of 70%. ?- Patient snored 3.4% during the sleep. ? ?DIAGNOSIS ?- Obstructive Sleep Apnea (G47.33) ?- Nocturnal Hypoxemia (G47.36) ? ?RECOMMENDATIONS ?- In this patient with morbid obesity, severe sleep disordered breathing and signicant nocturnal hypoxemia recommend therapeutic CPAP for treatment of her sleep disordered breathing. Recommend an in-lab CPAP titration, if unable, then initiated Auto-PAP with EPR at 7 - 20 cm of  water. ?- Effort should be made to optimize nasal and oropharyngeal patency. ?- Avoid alcohol, sedatives and other CNS depressants that may worsen sleep apnea and disrupt normal sleep architecture. ?- Sleep hygiene should be reviewed to assess factors that may improve sleep quality. ?- Weight management (BMI 74) and regular exercise should be initiated or continued. ?- Recommend a download and sleep clinic evaluation after one month of therapy. ? ? ?[Electronically signed] 02/12/2022 09:50 AM ? ?Shelva Majestic MD, [facility], ABSM ?Diplomate, Tax adviser of Sleep Medicine ? ?NPI: [id number] ? ?[place] ?PH: [phone]   FX: [phone] ?ACCREDITED BY THE AMERICAN ACADEMY OF SLEEP MEDICINE ? ?

## 2022-02-13 ENCOUNTER — Telehealth: Payer: Self-pay | Admitting: *Deleted

## 2022-02-13 ENCOUNTER — Other Ambulatory Visit: Payer: Self-pay | Admitting: Cardiovascular Disease

## 2022-02-13 DIAGNOSIS — I10 Essential (primary) hypertension: Secondary | ICD-10-CM

## 2022-02-13 DIAGNOSIS — G4733 Obstructive sleep apnea (adult) (pediatric): Secondary | ICD-10-CM

## 2022-02-13 DIAGNOSIS — I509 Heart failure, unspecified: Secondary | ICD-10-CM

## 2022-02-13 NOTE — Telephone Encounter (Signed)
Prior Authorization for CPAP titration sent to [facility] via web portal. Tracking Number [id number].  ?

## 2022-02-13 NOTE — Telephone Encounter (Signed)
-----   Message from Troy Sine, MD sent at 02/12/2022  9:56 AM EDT ----- ?Mariann Laster, please notify pt of results; Try for in-lab titration, if unable then Auto-PAP ?

## 2022-02-13 NOTE — Telephone Encounter (Signed)
Called the patient to discuss sleep study results and recommendations. Patients states that she has to call me back. She is currently driving. ?

## 2022-02-14 NOTE — Telephone Encounter (Signed)
[employer] Approval received for CPAP titration. Auth # [id number]. Valid dates 02/13/22 to 04/19/22. ?

## 2022-02-14 NOTE — Telephone Encounter (Signed)
Called patient and gave her HST results and recommendations. She agrees to proceed with MD recommendations. ?

## 2022-02-18 ENCOUNTER — Ambulatory Visit (HOSPITAL_BASED_OUTPATIENT_CLINIC_OR_DEPARTMENT_OTHER): Payer: Medicaid Other | Attending: Cardiovascular Disease | Admitting: Cardiovascular Disease

## 2022-02-18 DIAGNOSIS — G4733 Obstructive sleep apnea (adult) (pediatric): Secondary | ICD-10-CM

## 2022-02-18 DIAGNOSIS — I110 Hypertensive heart disease with heart failure: Secondary | ICD-10-CM | POA: Insufficient documentation

## 2022-02-18 DIAGNOSIS — I509 Heart failure, unspecified: Secondary | ICD-10-CM | POA: Diagnosis not present

## 2022-02-18 DIAGNOSIS — I10 Essential (primary) hypertension: Secondary | ICD-10-CM | POA: Diagnosis not present

## 2022-02-28 ENCOUNTER — Encounter (HOSPITAL_BASED_OUTPATIENT_CLINIC_OR_DEPARTMENT_OTHER): Payer: Self-pay | Admitting: Cardiovascular Disease

## 2022-02-28 NOTE — Procedures (Signed)
? ? ? ? ?Patient Name: Danielle Kent, Danielle Kent ?Study Date: 02/18/2022 ?Gender: Female ?D.O.B: [birth year] ?Age (years): 27 ?Referring Provider: Minus Breeding ?Height (inches): 60 ?Interpreting Physician: Shelva Majestic MD, ABSM ?Weight (lbs): 350 ?RPSGT: Carolin Coy ?BMI: 68 ?MRN: [id number] ?Neck Size: 19.00 ? ?CLINICAL INFORMATION ?The patient is referred for a BiPAP titration to treat sleep apnea. ? ?Date of HST: 02/05/2022: AHI 65.7; O2 nadir 70%. ? ?SLEEP STUDY TECHNIQUE ?As per the AASM Manual for the Scoring of Sleep and Associated Events v2.3 (April 2016) with a hypopnea requiring 4% desaturations. ? ?The channels recorded and monitored were frontal, central and occipital EEG, electrooculogram (EOG), submentalis EMG (chin), nasal and oral airflow, thoracic and abdominal wall motion, anterior tibialis EMG, snore microphone, electrocardiogram, and pulse oximetry. Bilevel positive airway pressure (BPAP) was initiated at the beginning of the study and titrated to treat sleep-disordered breathing. ? ?MEDICATIONS ?amLODipine (NORVASC) 10 MG tablet ?atorvastatin (LIPITOR) 40 MG tablet ?hydrochlorothiazide (HYDRODIURIL) 25 MG tablet ?labetalol (NORMODYNE) 200 MG tablet ?losartan (COZAAR) 100 MG  ?Medications self-administered by patient taken the night of the study : LABETALOL, LOSARTAN ? ?RESPIRATORY PARAMETERS ?Optimal IPAP Pressure (cm): 30 AHI at Optimal Pressure (/hr) 4.2 ?Optimal EPAP Pressure (cm): 25  ? ?Overall Minimal O2 (%): 79.0 Minimal O2 at Optimal Pressure (%): 92.0 ? ?SLEEP ARCHITECTURE ?Start Time: 10:53:56 PM Stop Time: 4:57:17 AM Total Time (min): 363.4 Total Sleep Time (min): 319 ?Sleep Latency (min): 1.1 Sleep Efficiency (%): 87.8% REM Latency (min): 70.0 WASO (min): 43.2 ?Stage N1 (%): 6.3% Stage N2 (%): 63.2% Stage N3 (%): 0.0% Stage R (%): 30.6 ?Supine (%): 51.57 Arousal Index (/hr): 17.3  ? ?CARDIAC DATA ?The 2 lead EKG demonstrated sinus rhythm. The mean heart rate was 72.3 beats per minute.  Other EKG findings include: PACs. ? ?LEG MOVEMENT DATA ?The total Periodic Limb Movements of Sleep (PLMS) were 0. The PLMS index was 0.0. A PLMS index of <15 is considered normal in adults. ? ?IMPRESSIONS ?- CPAP was initiated at 7 cm, titrated to 19 cm and was transitioned to BiPAP for continued events.  BiPAP was initiated at 23/19 cm; at 25/21 cm (AHI 14.3/h; RDI 37.2/h; O2 nadir 85%). BiPAP was titrated to maximal 30/25 cm of water. (AHI 2.8; RDI 5.6/h; O2 nadir 92%). ?- Central sleep apnea was not noted during this titration (CAI = 0.4/h). ?- Severe oxygen desaturations to a nadir of 79% at 23/19 cm of water. ?- The patient snored with moderate snoring volume. ?- No cardiac abnormalities were observed during this study. ?- Clinically significant periodic limb movements were not noted during this study. Arousals associated with PLMs were rare. ? ?DIAGNOSIS ?- Severe Obstructive Sleep Apnea (G47.33) ? ?RECOMMENDATIONS ?- Expeditious trial of BiPAP therapy at 30/25 cm H2O with heated humidification. A Medium size [employer]Paykel Full Face Vitera mask was used for the titration. ?- Effort should be made to optimize nasal and oropharyngeal patency. ?- Avoid alcohol, sedatives and other CNS depressants that may worsen sleep apnea and disrupt normal sleep architecture. ?- Sleep hygiene should be reviewed to assess factors that may improve sleep quality. ?- Weight management (BMI 68) and regular exercise should be initiated or continued. ?- Recommend a download in one month and sleep clinic evaluation after 4 weeks of therapy. ? ?[Electronically signed] 02/28/2022 10:35 AM ? ?Shelva Majestic MD, ABSM ?Diplomate, Tax adviser of Sleep Medicine ? ?NPI: [id number] ? ?[place] ?PH: [phone]   FX: [phone] ?ACCREDITED BY THE AMERICAN ACADEMY OF SLEEP  MEDICINE ? ?

## 2022-03-11 ENCOUNTER — Telehealth: Payer: Self-pay | Admitting: *Deleted

## 2022-03-11 NOTE — Telephone Encounter (Signed)
Patient notified titration study has been  completed. BIPAP order has been sent to [facility] as URGENT process.

## 2022-03-11 NOTE — Telephone Encounter (Signed)
-----   Message from Troy Sine, MD sent at 02/28/2022 10:42 AM EDT ----- Mariann Laster, please notify pt and set up with DME for expeditious BiPAP initiation

## 2022-03-21 ENCOUNTER — Telehealth: Payer: Self-pay | Admitting: *Deleted

## 2022-03-21 NOTE — Telephone Encounter (Signed)
Received a call from [facility] with [facility]. She spoke with the patient and set her up appointment for 03/21/22 to come in for BIPAP set up.patient was a "No show." They tried to contact her and kept getting a voicemail. They will attempt to contact the patient again. I will also send her a mychart message.

## 2022-04-01 ENCOUNTER — Telehealth: Payer: Self-pay | Admitting: *Deleted

## 2022-04-01 NOTE — Telephone Encounter (Signed)
Received a call from [facility] with [facility], again notifying me they have made multiple attempts t contact the patient to come in for BIPAP set up. She has not responded to any of there calls. They will make no further attempts to contact her. They left a message for her to  contact them when she is ready to proceed.

## 2022-05-23 ENCOUNTER — Encounter: Payer: Self-pay | Admitting: Neurology

## 2022-06-26 NOTE — Progress Notes (Signed)
[facility] Clinic Note - Initial Visit   Date: 06/27/2022   Danielle Kent MRN: [id number] DOB: 1986/09/17   Dear Earnestine Leys, FNP:  Thank you for your kind referral of Danielle Kent for consultation of right side numbness. Although her history is well known to you, please allow [place] to reiterate it for the purpose of our medical record. The patient was accompanied to the clinic by 37-year old daughter.     Danielle Kent is a 36 y.o. right-handed female with hypertension, morbid obesity, and hypertension presenting for evaluation of right arm and leg numbness.   IMPRESSION/PLAN: Right carpal tunnel syndrome based on markedly positive Tinel's sign at the wrist and clinical history.  This would not explain forearm symptoms, however. Therefore, additional testing with NCS/EMG will be ordered to evaluate for ulnar neuropathy.  For CTS, start using a wrist brace at nighttime.    Further recommendations pending results.   ------------------------------------------------------------- History of present illness: Starting around 10 years ago, she developed tingling involving the forearm and hand.  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  She stopped working there and it improved.  However, over the past few years, symptoms have become more bothersome and constant.  She finds relief with soaking the hand in hot water.  Sometimes, it wakes her up from sleeping.  She has some weakness in the hand and drops items.  No neck pain or radicular pain. Left hand also has similar symptoms occasionally.  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   Around 2020, she also had right foot numbness, but this self-resolved within a few months.    Out-side paper records, electronic medical record, and images have been reviewed where available and summarized as:  Lab Results  Component Value Date    HGBA1C 6.0 12/29/2019   No results found for: "VITAMINB12" Lab Results  Component Value Date   TSH 1.655 02/12/2021   No results found for: "ESRSEDRATE", "POCTSEDRATE"  Past Medical History:  Diagnosis Date   Asthma    Gestational diabetes 2020   Hypertension     Past Surgical History:  Procedure Laterality Date   CESAREAN SECTION     CESAREAN SECTION WITH BILATERAL TUBAL LIGATION N/A 04/03/2019   Procedure: CESAREAN SECTION WITH BILATERAL TUBAL LIGATION;  Surgeon: Florian Buff, MD;  Location: [facility];  Service: Obstetrics;  Laterality: N/A;   TEE WITHOUT CARDIOVERSION N/A 01/28/2022   Procedure: TRANSESOPHAGEAL ECHOCARDIOGRAM (TEE);  Surgeon: Sanda Klein, MD;  Location: [facility] ENDOSCOPY;  Service: Cardiovascular;  Laterality: N/A;     Medications:  Outpatient Encounter Medications as of 06/27/2022  Medication Sig   amLODipine (NORVASC) 10 MG tablet Take 1 tablet (10 mg total) by mouth daily.   atorvastatin (LIPITOR) 40 MG tablet Take 40 mg by mouth daily.   hydrochlorothiazide (HYDRODIURIL) 25 MG tablet Take 1 tablet (25 mg total) by mouth daily.   labetalol (NORMODYNE) 200 MG tablet Take 1 tablet (200 mg total) by mouth 3 (three) times daily.   losartan (COZAAR) 100 MG tablet Take 1 tablet (100 mg total) by mouth at bedtime.   No facility-administered encounter medications on file as of 06/27/2022.    Allergies: No Known Allergies  Family History: Family History  Problem Relation Age of Onset   Diabetes Mother    Hypertension Mother    Multiple sclerosis Mother  Asthma Father    Heart attack Maternal Grandfather 47    Social History: Social History   Tobacco Use   Smoking status: Never   Smokeless tobacco: Never  Vaping Use   Vaping Use: Never used  Substance Use Topics   Alcohol use: Not Currently   Drug use: Not Currently   Social History   Social History Narrative   Lives with two daughters.  Works from home.        Right Handed    Lives in  one story home    Vital Signs:  BP (!) 183/70   Pulse 88   Ht 5' (1.524 m)   Wt (!) 346 lb (156.9 kg)   SpO2 96%   BMI 67.57 kg/m    Neurological Exam: MENTAL STATUS including orientation to time, place, person, recent and remote memory, attention span and concentration, language, and fund of knowledge is normal.  Speech is not dysarthric.  CRANIAL NERVES: II:  No visual field defects.   III-IV-VI: Pupils equal round and reactive to light.  Normal conjugate, extra-ocular eye movements in all directions of gaze.  No nystagmus.  No ptosis.   V:  Normal facial sensation.    VII:  Normal facial symmetry and movements.   VIII:  Normal hearing and vestibular function.   IX-X:  Normal palatal movement.   XI:  Normal shoulder shrug and head rotation.   XII:  Normal tongue strength and range of motion, no deviation or fasciculation.  MOTOR:  No atrophy, fasciculations or abnormal movements.  No pronator drift.   Upper Extremity:  Right  Left  Deltoid  5/5   5/5   Biceps  5/5   5/5   Triceps  5/5   5/5   Wrist extensors  5/5   5/5   Wrist flexors  5/5   5/5   Finger extensors  5/5   5/5   Finger flexors  5/5   5/5   Dorsal interossei  5/5   5/5   Abductor pollicis  5-/5   5/5   Tone (Ashworth scale)  0  0   Lower Extremity:  Right  Left  Hip flexors  5/5   5/5   Knee extensors  5/5   5/5   Dorsiflexors  5/5   5/5   Plantarflexors  5/5   5/5   Toe extensors  5/5   5/5   Toe flexors  5/5   5/5   Tone (Ashworth scale)  0  0   MSRs:  Right        Left                  brachioradialis 2+  2+  biceps 2+  2+  triceps 2+  2+  patellar 2+  2+  ankle jerk 2+  2+  Hoffman no  no  plantar response down  down   SENSORY:  Reduced pin prick and temperature over the right hand, compared to the left. Markedly positive Tinel's sign at the right wrist.   COORDINATION/GAIT: Normal finger-to- nose-finger.  Intact rapid alternating movements bilaterally. Gait is wide-based due to body  habitus    Thank you for allowing me to participate in patient's care.  If I can answer any additional questions, I would be pleased to do so.    Sincerely,    Khristian Seals K. Posey Pronto, DO

## 2022-06-27 ENCOUNTER — Encounter: Payer: Self-pay | Admitting: Neurology

## 2022-06-27 ENCOUNTER — Ambulatory Visit: Payer: Medicaid Other | Admitting: Neurology

## 2022-06-27 VITALS — BP 183/70 | HR 88 | Ht 60.0 in | Wt 346.0 lb

## 2022-06-27 DIAGNOSIS — G5601 Carpal tunnel syndrome, right upper limb: Secondary | ICD-10-CM

## 2022-06-27 NOTE — Patient Instructions (Addendum)
Nerve testing of the right arm  Start using a wrist brace at bedtime  ELECTROMYOGRAM AND NERVE CONDUCTION STUDIES (EMG/NCS) INSTRUCTIONS  How to Prepare The neurologist conducting the EMG will need to know if you have certain medical conditions. Tell the neurologist and other EMG lab personnel if you: Have a pacemaker or any other electrical medical device Take blood-thinning medications Have hemophilia, a blood-clotting disorder that causes prolonged bleeding Bathing Take a shower or bath shortly before your exam in order to remove oils from your skin. Don't apply lotions or creams before the exam.  What to Expect You'll likely be asked to change into a hospital gown for the procedure and lie down on an examination table. The following explanations can help you understand what will happen during the exam.  Electrodes. The neurologist or a technician places surface electrodes at various locations on your skin depending on where you're experiencing symptoms. Or the neurologist may insert needle electrodes at different sites depending on your symptoms.  Sensations. The electrodes will at times transmit a tiny electrical current that you may feel as a twinge or spasm. The needle electrode may cause discomfort or pain that usually ends shortly after the needle is removed. If you are concerned about discomfort or pain, you may want to talk to the neurologist about taking a short break during the exam.  Instructions. During the needle EMG, the neurologist will assess whether there is any spontaneous electrical activity when the muscle is at rest - activity that isn't present in healthy muscle tissue - and the degree of activity when you slightly contract the muscle.  He or she will give you instructions on resting and contracting a muscle at appropriate times. Depending on what muscles and nerves the neurologist is examining, he or she may ask you to change positions during the exam.  After your  EMG You may experience some temporary, minor bruising where the needle electrode was inserted into your muscle. This bruising should fade within several days. If it persists, contact your primary care doctor.

## 2022-07-17 ENCOUNTER — Ambulatory Visit
Admission: EM | Admit: 2022-07-17 | Discharge: 2022-07-17 | Disposition: A | Discharge status: Home / Self Care | Payer: Medicaid Other | Attending: Internal Medicine | Admitting: Internal Medicine

## 2022-07-17 DIAGNOSIS — R030 Elevated blood-pressure reading, without diagnosis of hypertension: Secondary | ICD-10-CM | POA: Insufficient documentation

## 2022-07-17 DIAGNOSIS — Z113 Encounter for screening for infections with a predominantly sexual mode of transmission: Secondary | ICD-10-CM | POA: Insufficient documentation

## 2022-07-17 DIAGNOSIS — Z202 Contact with and (suspected) exposure to infections with a predominantly sexual mode of transmission: Secondary | ICD-10-CM | POA: Diagnosis not present

## 2022-07-17 MED ORDER — CEFTRIAXONE SODIUM 1 G IJ SOLR
1.0000 g | Freq: Once | INTRAMUSCULAR | Status: DC
  Administered 2022-07-21: 1 g via INTRAMUSCULAR

## 2022-07-17 NOTE — ED Provider Notes (Signed)
[facility]    CSN: [id number] Arrival date & time: 07/17/22  1622      History   Chief Complaint Chief Complaint  Patient presents with   Exposure to STD    Entered by patient    HPI Danielle Kent is a 36 y.o. female.   Patient presents today for STD testing as she was told that she had an exposure to gonorrhea from her recent sexual partner after having unprotected sexual intercourse a few days prior.  Patient denies any associated symptoms including vaginal discharge, abnormal vaginal bleeding, dysuria, urinary frequency, abdominal pain, back pain, fever.  Patient reports that her sexual partner has been tested and treated.  Last menstrual cycle was 07/04/2022.  Also has elevated blood pressure reading but reports that that she is very stressed out due to recent STD exposure.  Patient states that she has to take her blood pressure twice a day and it is typically 315 systolic.  She denies chest pain, shortness of breath, headache, dizziness, blurred vision, nausea, vomiting.   Exposure to STD    Past Medical History:  Diagnosis Date   Asthma    Gestational diabetes 2020   Hypertension     Patient Active Problem List   Diagnosis Date Noted   Aortic valve regurgitation    Murmur 11/02/2021   Hypertensive urgency 02/13/2021   Acute CHF (congestive heart failure) ([place]) 02/12/2021   Hypertensive emergency 02/12/2021   Acute pulmonary edema ([place])    Paresthesia of right upper extremity 01/17/2020   Essential hypertension 12/30/2019   Prediabetes 12/30/2019   Morbid obesity ([place]) 12/30/2019   BMI 70 and over, adult ([place]) 03/15/2019   Chronic renal insufficiency 03/15/2019   Severe hypertension    Chronic hypertension with superimposed severe preeclampsia 02/23/2019   Preexisting diabetes complicating pregnancy, antepartum 12/27/2018   Chronic hypertension during pregnancy, antepartum 12/06/2018   Maternal morbid obesity, antepartum ([place]) 12/06/2018    Past  Surgical History:  Procedure Laterality Date   CESAREAN SECTION     CESAREAN SECTION WITH BILATERAL TUBAL LIGATION N/A 04/03/2019   Procedure: CESAREAN SECTION WITH BILATERAL TUBAL LIGATION;  Surgeon: Florian Buff, MD;  Location: [facility]S;  Service: Obstetrics;  Laterality: N/A;   TEE WITHOUT CARDIOVERSION N/A 01/28/2022   Procedure: TRANSESOPHAGEAL ECHOCARDIOGRAM (TEE);  Surgeon: Sanda Klein, MD;  Location: [facility] ENDOSCOPY;  Service: Cardiovascular;  Laterality: N/A;    OB History     Gravida  2   Para  2   Term  1   Preterm  1   AB      Living  2      SAB      IAB      Ectopic      Multiple  0   Live Births  2            Home Medications    Prior to Admission medications   Medication Sig Start Date End Date Taking? Authorizing Provider  amLODipine (NORVASC) 10 MG tablet Take 1 tablet (10 mg total) by mouth daily. 02/14/21   Oswald Hillock, MD  atorvastatin (LIPITOR) 40 MG tablet Take 40 mg by mouth daily. 09/21/21   [provider]  hydrochlorothiazide (HYDRODIURIL) 25 MG tablet Take 1 tablet (25 mg total) by mouth daily. 02/15/21   Oswald Hillock, MD  labetalol (NORMODYNE) 200 MG tablet Take 1 tablet (200 mg total) by mouth 3 (three) times daily. 02/14/21   Oswald Hillock, MD  losartan (COZAAR) 100 MG tablet Take 1 tablet (100 mg total) by mouth at bedtime. 01/09/22   Minus Breeding, MD    Family History Family History  Problem Relation Age of Onset   Diabetes Mother    Hypertension Mother    Multiple sclerosis Mother    Asthma Father    Heart attack Maternal Grandfather 57    Social History Social History   Tobacco Use   Smoking status: Never   Smokeless tobacco: Never  Vaping Use   Vaping Use: Never used  Substance Use Topics   Alcohol use: Not Currently   Drug use: Not Currently     Allergies   Patient has no known allergies.   Review of Systems Review of Systems Per HPI  Physical Exam Triage Vital Signs ED Triage Vitals  [07/17/22 1654]  Enc Vitals Group     BP (!) 175/82     Pulse Rate 82     Resp 18     Temp 99 F (37.2 C)     Temp Source Oral     SpO2 98 %     Weight      Height      Head Circumference      Peak Flow      Pain Score 0     Pain Loc      Pain Edu?      Excl. in [place]?    No data found.  Updated Vital Signs BP (!) 175/82 (BP Location: Left Arm)   Pulse 82   Temp 99 F (37.2 C) (Oral)   Resp 18   LMP 06/29/2022   SpO2 98%   Visual Acuity Right Eye Distance:   Left Eye Distance:   Bilateral Distance:    Right Eye Near:   Left Eye Near:    Bilateral Near:     Physical Exam Constitutional:      General: She is not in acute distress.    Appearance: Normal appearance. She is not toxic-appearing or diaphoretic.  HENT:     Head: Normocephalic and atraumatic.  Eyes:     Extraocular Movements: Extraocular movements intact.     Conjunctiva/sclera: Conjunctivae normal.     Pupils: Pupils are equal, round, and reactive to light.  Cardiovascular:     Rate and Rhythm: Normal rate and regular rhythm.     Pulses: Normal pulses.     Heart sounds: Normal heart sounds.  Pulmonary:     Effort: Pulmonary effort is normal. No respiratory distress.     Breath sounds: Normal breath sounds.  Genitourinary:    Comments: Deferred with shared decision-making.  Self swab performed. Neurological:     General: No focal deficit present.     Mental Status: She is alert and oriented to person, place, and time. Mental status is at baseline.     Cranial Nerves: Cranial nerves 2-12 are intact.     Sensory: Sensation is intact.     Motor: Motor function is intact.     Coordination: Coordination is intact.     Gait: Gait is intact.  Psychiatric:        Mood and Affect: Mood normal.        Behavior: Behavior normal.        Thought Content: Thought content normal.        Judgment: Judgment normal.      UC Treatments / Results  Labs (all labs ordered are listed, but only abnormal  results are displayed) Labs  Reviewed  CERVICOVAGINAL ANCILLARY ONLY    EKG   Radiology No results found.  Procedures Procedures (including critical care time)  Medications Ordered in UC Medications  cefTRIAXone (ROCEPHIN) injection 1 g (has no administration in time range)    Initial Impression / Assessment and Plan / UC Course  I have reviewed the triage vital signs and the nursing notes.  Pertinent labs & imaging results that were available during my care of the patient were reviewed by me and considered in my medical decision making (see chart for details).     Will treat with IM Rocephin today in urgent care given patient's gonorrhea exposure.  Cervicovaginal swab pending.  Will await results for any further treatment.  Discussed safe sex practices and refraining from sexual activity for least 7 days following treatment.  Has elevated blood pressure reading but is attributing this to being stressed out due to gonorrhea exposure.  Patient does not have any associated symptoms, therefore no signs of hypertensive urgency or endorgan damage.  Neuro exam is also normal.  Therefore, do not think any emergent evaluation is necessary.  Patient advised to monitor blood pressure very closely at home and to follow-up with cardiologist or PCP if remains elevated.  She was also given strict ER precautions.  Patient verbalized understanding and was agreeable with plan. Final Clinical Impressions(s) / UC Diagnoses   Final diagnoses:  Exposure to gonorrhea  Screening examination for venereal disease  Elevated blood pressure reading     Discharge Instructions      You were given an antibiotic injection today in urgent care due to gonorrhea exposure.  Refrain from sexual activity for at least 7 days following treatment.  Your vaginal swab is pending.  We will call if it is abnormal.    ED Prescriptions   None    PDMP not reviewed this encounter.   Teodora Medici, [place] 07/17/22  [phone]

## 2022-07-17 NOTE — Discharge Instructions (Signed)
You were given an antibiotic injection today in urgent care due to gonorrhea exposure.  Refrain from sexual activity for at least 7 days following treatment.  Your vaginal swab is pending.  We will call if it is abnormal.

## 2022-07-17 NOTE — ED Triage Notes (Signed)
Pt states notified today of an exposure to gonorrhea. Denies s/s.

## 2022-07-18 ENCOUNTER — Telehealth (HOSPITAL_COMMUNITY): Payer: Self-pay | Admitting: Emergency Medicine

## 2022-07-18 LAB — CERVICOVAGINAL ANCILLARY ONLY
Bacterial Vaginitis (gardnerella): POSITIVE — AB
Candida Glabrata: NEGATIVE
Candida Vaginitis: NEGATIVE
Chlamydia: NEGATIVE
Comment: NEGATIVE
Comment: NEGATIVE
Comment: NEGATIVE
Comment: NEGATIVE
Comment: NEGATIVE
Comment: NORMAL
Neisseria Gonorrhea: POSITIVE — AB
Trichomonas: NEGATIVE

## 2022-07-18 MED ORDER — METRONIDAZOLE 0.75 % VA GEL: 1.0000 | Freq: Every day | VAGINAL | 0 refills | Status: AC

## 2022-07-30 ENCOUNTER — Other Ambulatory Visit (HOSPITAL_COMMUNITY): Payer: Medicaid Other

## 2022-08-05 ENCOUNTER — Ambulatory Visit (INDEPENDENT_AMBULATORY_CARE_PROVIDER_SITE_OTHER): Payer: Medicaid Other | Admitting: Neurology

## 2022-08-05 DIAGNOSIS — G5601 Carpal tunnel syndrome, right upper limb: Secondary | ICD-10-CM | POA: Diagnosis not present

## 2022-08-05 NOTE — Procedures (Signed)
[facility]  [address]  [place], [place] [address] Tel: [phone] Fax:  [phone] Test Date:  08/05/2022  Patient: Danielle Kent DOB: 1986/04/03 Physician: Arvin Collard Davell Beckstead,DO  Sex: Female Height: 5' " Ref Phys: Narda Amber, DO  ID#: [id number]   Technician:    Patient Complaints: This is a 36 year old female referred for evaluation of right hand paresthesias.  NCV & EMG Findings: Extensive electrodiagnostic testing of the right upper extremity shows:  Right median sensory response shows prolonged latency (6.4 ms) and reduced amplitude (4.0 V).  Right ulnar sensory response is within normal limits. Right median motor response shows prolonged latency (6.5 ms).  Of note, there is evidence of a right Martin-Gruber anastomosis, a normal anatomic variant.  Right ulnar motor response is within normal limits.   Sparse chronic motor axonal loss changes are seen affecting the right abductor pollicis brevis muscle, without accompanied active denervation.  Impression: Right median neuropathy at or distal to the wrist, consistent with a clinical diagnosis of carpal tunnel syndrome.  Overall, these findings are moderate-to-severe in degree electrically.   ___________________________ Arvin Collard Kery Batzel,DO    Nerve Conduction Studies Anti Sensory Summary Table   Stim Site NR Peak (ms) Norm Peak (ms) O-P Amp (V) Norm O-P Amp  Right Median Anti Sensory (2nd Digit)  33C  Wrist    6.4 <3.4 4.0 >20  Right Ulnar Anti Sensory (5th Digit)  33C  Wrist    2.3 <3.1 37.7 >12   Motor Summary Table   Stim Site NR Onset (ms) Norm Onset (ms) O-P Amp (mV) Norm O-P Amp Site1 Site2 Delta-0 (ms) Dist (cm) Vel (m/s) Norm Vel (m/s)  Right Median Motor (Abd Poll Brev)  33C  Wrist    6.5 <3.9 7.8 >6 Elbow Wrist 2.3 28.0 122 >50  Elbow    8.8  5.6  Ulnar-wrist crossover Elbow 4.6 0.0    Ulnar-wrist crossover    4.2  1.4         Right Ulnar Motor (Abd Dig Minimi)  33C  Wrist    2.2  <3.1 12.3 >7 B Elbow Wrist 3.7 25.0 68 >50  B Elbow    5.9  11.8  A Elbow B Elbow 1.4 10.0 71 >50  A Elbow    7.3  11.2          EMG   Side Muscle Ins Act Fibs Fasc Recrt Dur. Amp. Poly. Activation Comment  Right 1stDorInt Nml Nml Nml Nml Nml Nml Nml Nml N/A  Right Abd Poll Brev Nml Nml Nml 1- 1+ 1+ 1+ Nml N/A  Right PronatorTeres Nml Nml Nml Nml Nml Nml Nml Nml N/A  Right Biceps Nml Nml Nml Nml Nml Nml Nml Nml N/A  Right Triceps Nml Nml Nml Nml Nml Nml Nml Nml N/A  Right Deltoid Nml Nml Nml Nml Nml Nml Nml Nml N/A      Waveforms:

## 2022-08-05 NOTE — Progress Notes (Signed)
    Follow-up Visit   Date: 08/05/2022    Danielle Kent MRN: [id number] DOB: [birth year]    Danielle Kent is a 36 y.o. right-handed female returning to the clinic for follow-up of right arm numbness.  The patient was accompanied to the clinic by self.     IMPRESSION/PLAN: Right carpal tunnel syndrome, moderate-to-severe, confirmed by EMG.   Management options were discussed including nighttime splinting with wrist brace, injection, surgery, or observation.  I suggest that she try to use a wrist brace for a 2-3 months at night time and see if this provides any relief.  She may try going to a medical supply store, if she is unable to find a suitable brace, then we can refer her to [employer] for custom brace.  If no improvement with conservative therapies, then referral can be made to hand orthopeadics for steroid injection.  She is not interested in surgery at this time.    --------------------------------------------- History of present illness: Starting around 10 years ago, she developed tingling involving the forearm and hand.  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  She stopped working there and it improved.  However, over the past few years, symptoms have become more bothersome and constant.  She finds relief with soaking the hand in hot water.  Sometimes, it wakes her up from sleeping.  She has some weakness in the hand and drops items.  No neck pain or radicular pain. Left hand also has similar symptoms occasionally.  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   Around 2020, she also had right foot numbness, but this self-resolved within a few months.    UPDATE 08/05/2022: She is here for electrodiagnostic testing.  There has been no change in her symptoms. She tried a wrist brace but due to discomfort, stopped using it.  Medications:  Current Outpatient Medications on File Prior to Visit   Medication Sig Dispense Refill   amLODipine (NORVASC) 10 MG tablet Take 1 tablet (10 mg total) by mouth daily. 30 tablet 3   atorvastatin (LIPITOR) 40 MG tablet Take 40 mg by mouth daily.     hydrochlorothiazide (HYDRODIURIL) 25 MG tablet Take 1 tablet (25 mg total) by mouth daily. 30 tablet 3   labetalol (NORMODYNE) 200 MG tablet Take 1 tablet (200 mg total) by mouth 3 (three) times daily. 90 tablet 3   losartan (COZAAR) 100 MG tablet Take 1 tablet (100 mg total) by mouth at bedtime. 90 tablet 3   No current facility-administered medications on file prior to visit.    Allergies: No Known Allergies  Vital Signs:  LMP 06/29/2022    Neurological Exam: Deferred  Data: NCS/EMG of the right arm 08/05/2022: Right median neuropathy at or distal to the wrist, consistent with a clinical diagnosis of carpal tunnel syndrome.  Overall, these findings are moderate-to-severe in degree electrically.   Thank you for allowing me to participate in patient's care.  If I can answer any additional questions, I would be pleased to do so.    Sincerely,    Mance Vallejo K. Posey Pronto, DO

## 2022-08-14 ENCOUNTER — Ambulatory Visit (HOSPITAL_COMMUNITY): Payer: Medicaid Other | Attending: Cardiology

## 2022-08-14 DIAGNOSIS — I351 Nonrheumatic aortic (valve) insufficiency: Secondary | ICD-10-CM | POA: Diagnosis present

## 2022-08-14 LAB — ECHOCARDIOGRAM COMPLETE
Area-P 1/2: 4.08 cm2
P 1/2 time: 280 msec
S' Lateral: 2.7 cm

## 2022-08-20 NOTE — Progress Notes (Unsigned)
,     Cardiology Office Note   Date:  08/21/2022   ID:  Danielle Kent, DOB 1986/10/07, MRN [id number]  PCP:  Joycelyn Man, FNP  Cardiologist:   Minus Breeding, MD Referring:  Joycelyn Man, FNP  Chief Complaint  Patient presents with   Valvular heart disease      History of Present Illness: Danielle Kent is a 36 y.o. female who presents for evaluation of a murmur.   She was referred by Joycelyn Man, FNP.  She has been noted to have a heart murmur.  She says this was mentioned 2 years ago when she had her last pregnancy but not really before this. An echo in 2020 demonstrated a well-preserved ejection fraction and did not suggest any valvular abnormalities. Since I last saw her she had TEE demonstrating severe AI but normal LV size and function. Echo had an EF of 60 -65%.  She had a repeat TTE in October with severe AI but NL LV function and size.     She returns to talk about this.  She is not having any new shortness of breath, PND or orthopnea.  She is not having any palpitations, presyncope or syncope.  She had an episode of sharp chest pain yesterday when she turned around in her car to hand her 87-year-old something to drink.  There was a sharp discomfort that was shooting to her chest but th it has gone away and she has not had any today.  She never had this before.    She was found to have severe sleep apnea when I sent her for testing.  She is post to wear the BiPAP but she said she was too anxious to wear it.  She does not have a device.  Has run out of her labetalol.  Her blood pressure has been up.  She has been walking for exercise.  She said this has been going well.   Past Medical History:  Diagnosis Date   Asthma    Gestational diabetes 2020   Hypertension     Past Surgical History:  Procedure Laterality Date   CESAREAN SECTION     CESAREAN SECTION WITH BILATERAL TUBAL LIGATION N/A 04/03/2019   Procedure: CESAREAN SECTION WITH BILATERAL TUBAL LIGATION;  Surgeon: Florian Buff, MD;  Location: [facility] LD [facility]S;  Service: Obstetrics;  Laterality: N/A;   TEE WITHOUT CARDIOVERSION N/A 01/28/2022   Procedure: TRANSESOPHAGEAL ECHOCARDIOGRAM (TEE);  Surgeon: Sanda Klein, MD;  Location: [facility] END[facility]COPY;  Service: Cardiovascular;  Laterality: N/A;     Current Outpatient Medications  Medication Sig Dispense Refill   amLODipine (NORVASC) 10 MG tablet Take 1 tablet (10 mg total) by mouth daily. 30 tablet 3   atorvastatin (LIPITOR) 40 MG tablet Take 40 mg by mouth daily.     hydrochlorothiazide (HYDRODIURIL) 25 MG tablet Take 1 tablet (25 mg total) by mouth daily. 30 tablet 3   losartan (COZAAR) 100 MG tablet Take 1 tablet (100 mg total) by mouth at bedtime. 90 tablet 3   labetalol (NORMODYNE) 200 MG tablet Take 1 tablet (200 mg total) by mouth 3 (three) times daily. 270 tablet 3   No current facility-administered medications for this visit.    Allergies:   Patient has no known allergies.    ROS:  Please see the history of present illness.   Otherwise, review of systems are positive for none.   All other systems are reviewed and negative.    PHYSICAL EXAM: VS:  BP (!) 164/75   Pulse 89   Ht 5' (1.524 m)   Wt (!) 343 lb (155.6 kg)   SpO2 96%   BMI 66.99 kg/m  , BMI Body mass index is 66.99 kg/m. GENERAL:  Well appearing NECK:  No jugular venous distention, waveform within normal limits, carotid upstroke brisk and symmetric, no bruits, no thyromegaly LUNGS:  Clear to auscultation bilaterally CHEST:  Unremarkable HEART:  PMI not displaced or sustained,S1 and S2 within normal limits, no S3, no S4, no clicks, no rubs, 3 out of 6 diastolic murmur, no systolic murmurs ABD:  Flat, positive bowel sounds normal in frequency in pitch, no bruits, no rebound, no guarding, no midline pulsatile mass, no hepatomegaly, no splenomegaly EXT:  2 plus pulses throughout, no edema, no cyanosis no clubbing  EKG:  EKG is  ordered today. Sinus rhythm, rate 89, axis within normal limits,  intervals within normal limits, no acute ST-T wave changes.  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 Consider sequelae of endocarditis. There is holodiastolic flow reversal in the descending  There is no aortic stenosis, no aortic root dilation and no aortic coarctation. Otherwise normal exam with normal left ventricular size and function and no significant abnormalities of the other valves.  Other studies Reviewed: Additional studies/ records that were reviewed today include: TEE  Review of the above records demonstrates:  Please see elsewhere in the note.     A a gym membership and I thinkSSESSMENT AND PLAN:  HTN: Her blood pressure is not controlled but she has not been on her medicines.  We will order renewing this and she will keep a blood pressure diary.  She understands the importance of good blood pressure control.   AI:   This is severe.  She is asymptomatic.  She has normal chamber size and function.  I can continue to follow this and will look at this with an echo in 6 months.    MORBID OBESITY:   [facility] she might be a very good candidate for [facility].  She said she had a phone call from somebody.  We will try to follow-up on that.  Sleep apnea: I had a long discussion with her about sleep apnea and she is hopefully going  to pursue this treatment.  Current medicines are reviewed at length with the patient today.  The patient does not have concerns regarding medicines.  The following changes have been made: None  Labs/ tests ordered today include:   None  Orders Placed This Encounter  Procedures   Amb Ref to [facility]   EKG 12-[place]   ECHOCARDIOGRAM COMPLETE    Disposition:   FU with HTN clinic to ensure med compliance.  Otherwise I will see her back in 6  months after the echo.    Signed, Minus Breeding, MD  08/21/2022 4:40 PM    [facility]

## 2022-08-21 ENCOUNTER — Ambulatory Visit: Payer: Medicaid Other | Attending: Cardiology | Admitting: Cardiology

## 2022-08-21 ENCOUNTER — Encounter: Payer: Self-pay | Admitting: Cardiology

## 2022-08-21 VITALS — BP 164/75 | HR 89 | Ht 60.0 in | Wt 343.0 lb

## 2022-08-21 DIAGNOSIS — I10 Essential (primary) hypertension: Secondary | ICD-10-CM | POA: Diagnosis not present

## 2022-08-21 DIAGNOSIS — R079 Chest pain, unspecified: Secondary | ICD-10-CM | POA: Diagnosis not present

## 2022-08-21 DIAGNOSIS — I351 Nonrheumatic aortic (valve) insufficiency: Secondary | ICD-10-CM

## 2022-08-21 MED ORDER — LABETALOL HCL 200 MG PO TABS: 200.0000 mg | ORAL_TABLET | Freq: Three times a day (TID) | ORAL | 3 refills | Status: AC

## 2022-08-21 MED ORDER — LABETALOL HCL 200 MG PO TABS: 200.0000 mg | ORAL_TABLET | Freq: Three times a day (TID) | ORAL | 3 refills | Status: DC

## 2022-08-21 NOTE — Patient Instructions (Addendum)
Medication Instructions:  Your Physician recommend you continue on your current medication as directed.    *If you need a refill on your cardiac medications before your next appointment, please call your pharmacy*   Lab Work: None ordered today   Testing/Procedures: Your physician has requested that you have an echocardiogram in May, 2024. Echocardiography is a painless test that uses sound waves to create images of your heart. It provides your doctor with information about the size and shape of your heart and how well your heart's chambers and valves are working. This procedure takes approximately one hour. There are no restrictions for this procedure. [address]    Follow-Up: At [facility], you and your health needs are our priority.  As part of our continuing mission to provide you with exceptional heart care, we have created designated Provider Care Teams.  These Care Teams include your primary Cardiologist (physician) and Advanced Practice Providers (APPs -  Physician Assistants and Nurse Practitioners) who all work together to provide you with the care you need, when you need it.  We recommend signing up for the patient portal called "MyChart".  Sign up information is provided on this After Visit Summary.  MyChart is used to connect with patients for Virtual Visits (Telemedicine).  Patients are able to view lab/test results, encounter notes, upcoming appointments, etc.  Non-urgent messages can be sent to your provider as well.   To learn more about what you can do with MyChart, go to [email or web address].    Your next appointment:   6 month(s)  The format for your next appointment:   In Person  Provider:   Minus Breeding, MD     Your physician recommends that you schedule a follow-up appointment in 1 month with Pharm D (Hypertension [facility])

## 2022-08-25 ENCOUNTER — Telehealth: Payer: Self-pay | Admitting: *Deleted

## 2022-08-25 NOTE — Telephone Encounter (Signed)
BIPAP order sent to [facility] via parachute portal. Verbal pressure changes were given by Dr Shelva Majestic as ordered. Home BIPAP machines will not go up to the previously pressures ordered by Dr Claiborne Billings. Verbal orders were as follows. BIPAP Auto, EPAP min 18 CmH2O IPAP max 25. Pressure support 4.

## 2022-09-01 ENCOUNTER — Encounter (INDEPENDENT_AMBULATORY_CARE_PROVIDER_SITE_OTHER): Payer: Self-pay

## 2022-09-15 ENCOUNTER — Other Ambulatory Visit (HOSPITAL_COMMUNITY): Payer: Medicaid Other

## 2022-09-22 ENCOUNTER — Ambulatory Visit: Payer: Medicaid Other | Admitting: Physician Assistant

## 2022-09-23 ENCOUNTER — Encounter: Payer: Self-pay | Admitting: Surgery

## 2022-09-23 ENCOUNTER — Ambulatory Visit (INDEPENDENT_AMBULATORY_CARE_PROVIDER_SITE_OTHER): Payer: Medicaid Other | Admitting: Surgery

## 2022-09-23 DIAGNOSIS — G5601 Carpal tunnel syndrome, right upper limb: Secondary | ICD-10-CM | POA: Diagnosis not present

## 2022-09-23 NOTE — Progress Notes (Signed)
Office Visit Note   Patient: Danielle Kent           Date of Birth: 12/07/1985           MRN: [id number] Visit Date: 09/23/2022              Requested by: Joycelyn Man, [address] [place],  [place] [address] PCP: Joycelyn Man, FNP   Assessment & Plan: Visit Diagnoses:  1. Carpal tunnel syndrome, right upper limb   Moderate to severe on NCV/EMG study  Plan: With patient's ongoing and worsening symptoms and findings on NCV/EMG studies I will refer her to Dr. Roseanne Kaufman hand surgeon with [facility] to discuss possible scheduling of carpal tunnel release.  I did reach out to Dr. Jeannie Fend and he will have someone from his office contact patient.  Patient was given a removable wrist plan today to see if this can help relieve some of her symptoms.  Follow-Up Instructions: No follow-ups on file.   Orders:  No orders of the defined types were placed in this encounter.  No orders of the defined types were placed in this encounter.     Procedures: No procedures performed   Clinical Data: No additional findings.   Subjective: Chief Complaint  Patient presents with   Right Wrist - Pain    HPI 36 year old black female is new patient to clinic comes in today for treatment of right carpal tunnel syndrome.  Patient states symptoms been progressively worsening over the last few months.  Primary care provider ordered NCV/EMG study which was done August 04, 2022.  That report showed:  Impression: Right median neuropathy at or distal to the wrist, consistent with a clinical diagnosis of carpal tunnel syndrome.  Overall, these findings are moderate-to-severe in degree electrically.    Symptoms are progressively worsening with pain numbness and tingling into the thumb index and long finger.  Review of Systems No current complaints of cardiopulmonary GI/GU issues  Objective: Vital Signs: There were no vitals taken for this visit.  Physical Exam Constitutional:       Appearance: She is obese.  HENT:     Head: Normocephalic and atraumatic.  Eyes:     Extraocular Movements: Extraocular movements intact.  Musculoskeletal:     Comments: Exam right wrist she has good range of motion.  Positive Tinel's and Phalen's.  Question trace right grip weakness.  Mildly positive Tinel's over the left wrist.  Neurological:     Mental Status: She is alert and oriented to person, place, and time.  Psychiatric:        Mood and Affect: Mood normal.     Ortho Exam  Specialty Comments:  No specialty comments available.  Imaging: No results found.   PMFS History: Patient Active Problem List   Diagnosis Date Noted   Aortic valve regurgitation    Murmur 11/02/2021   Hypertensive urgency 02/13/2021   Acute CHF (congestive heart failure) ([place]) 02/12/2021   Hypertensive emergency 02/12/2021   Acute pulmonary edema ([place])    Paresthesia of right upper extremity 01/17/2020   Essential hypertension 12/30/2019   Prediabetes 12/30/2019   Morbid obesity ([place]) 12/30/2019   BMI 70 and over, adult ([place]) 03/15/2019   Chronic renal insufficiency 03/15/2019   Severe hypertension    Chronic hypertension with superimposed severe preeclampsia 02/23/2019   Preexisting diabetes complicating pregnancy, antepartum 12/27/2018   Chronic hypertension during pregnancy, antepartum 12/06/2018   Maternal morbid obesity, antepartum ([place]) 12/06/2018   Past Medical  History:  Diagnosis Date   Asthma    Gestational diabetes 2020   Hypertension     Family History  Problem Relation Age of Onset   Diabetes Mother    Hypertension Mother    Multiple sclerosis Mother    Asthma Father    Heart attack Maternal Grandfather 75    Past Surgical History:  Procedure Laterality Date   CESAREAN SECTION     CESAREAN SECTION WITH BILATERAL TUBAL LIGATION N/A 04/03/2019   Procedure: CESAREAN SECTION WITH BILATERAL TUBAL LIGATION;  Surgeon: Florian Buff, MD;  Location: [facility] LD ORS;  Service:  Obstetrics;  Laterality: N/A;   TEE WITHOUT CARDIOVERSION N/A 01/28/2022   Procedure: TRANSESOPHAGEAL ECHOCARDIOGRAM (TEE);  Surgeon: Sanda Klein, MD;  Location: [facility] ENDOSCOPY;  Service: Cardiovascular;  Laterality: N/A;   Social History   Occupational History   Not on file  Tobacco Use   Smoking status: Never   Smokeless tobacco: Never  Vaping Use   Vaping Use: Never used  Substance and Sexual Activity   Alcohol use: Not Currently   Drug use: Not Currently   Sexual activity: Yes    Birth control/protection: None

## 2022-09-25 ENCOUNTER — Ambulatory Visit
Payer: Medicaid Other | Attending: Cardiovascular Disease | Admitting: Pharmacist Clinician (PhC)/ Clinical Pharmacy Specialist

## 2022-09-25 ENCOUNTER — Encounter: Payer: Self-pay | Admitting: Pharmacist Clinician (PhC)/ Clinical Pharmacy Specialist

## 2022-09-25 VITALS — BP 154/78 | HR 84

## 2022-09-25 DIAGNOSIS — I10 Essential (primary) hypertension: Secondary | ICD-10-CM

## 2022-09-25 NOTE — Progress Notes (Signed)
   Office Visit    Patient Name: Danielle Kent Date of Encounter: 10/17/2022  Primary Care Provider:  Joycelyn Man, FNP Primary Cardiologist:  Minus Breeding, MD  Chief Complaint    Hypertension  Significant Past Medical History   Aortic valve Severe leak noted 11/2021  Pre-diabetes Most recent A1c 6.2  obesity BMI 67, wt 155.6 kg    No Known Allergies  History of Present Illness    Danielle Kent is a 36 y.o. female patient of Dr Percival Spanish, with above medical history in the office to discuss challenges with controlling blood pressure.  She had problems with hypertension through her last pregnancy, developed pre-eclampsia and delivered her daughter at 26 weeks.    Blood Pressure Goal:  130/80  Current Medications: losartan 100 mg qd - pm, hctz 25 mg qd - am, amlodipine 10 mg - am, labetalol 200 mg tid  Family Hx: neither parent with heart disease, father has asthma, mother has MS; brother and sister both healthy, 2 kids 80, 3 -both healthy girls    Social Hx:      Tobacco: no  Alcohol: no  Caffeine: coffee most days (instant)  Diet:   mostly home cooked meals, fish and chicken and [place] ; vegetables fresh and frozen; snacking - frozen fruit   Exercise: walks daily, sometimes limited by SOB; up to 45 minutes  Home BP readings: none recently at home      Accessory Clinical Findings    8/23 labs in Care Everywhere  Na 140, K 4.0, Glu 94, BUN 19, SCr 1.25, GFR 57  Lab Results  Component Value Date   CREATININE 1.21 (H) 01/24/2022   BUN 17 01/24/2022   NA 142 01/24/2022   K 3.9 01/24/2022   CL 99 01/24/2022   CO2 25 01/24/2022   Lab Results  Component Value Date   ALT 39 04/04/2019   AST 35 04/04/2019   ALKPHOS 69 04/04/2019   BILITOT 0.5 04/04/2019   Lab Results  Component Value Date   HGBA1C 6.0 12/29/2019    Home Medications    Current Outpatient Medications  Medication Sig Dispense Refill   atorvastatin (LIPITOR) 40 MG tablet Take 40 mg by mouth daily.      labetalol (NORMODYNE) 200 MG tablet Take 1 tablet (200 mg total) by mouth 3 (three) times daily. 270 tablet 3   Olmesartan-amLODIPine-HCTZ 40-10-25 MG TABS Take 1 tablet by mouth daily. 30 tablet 6   No current facility-administered medications for this visit.     Assessment & Plan     Essential hypertension Assessment: BP is uncontrolled in office BP 154/78 mmHg;  above the goal (<130/80). Tolerates losartan, hctz and amlodipine well without any side effects Denies SOB, palpitation, chest pain, headaches,or swelling Reiterated the importance of regular exercise and low salt diet   Plan:  Stop taking losartan, hctz and amlodipine  Start taking combination tablet olmesartan/amlodipine/hctz 40/10/25 Continue taking labaetalol Patient to keep record of BP readings with heart rate and report to [place] at the next visit Patient to follow up with PharmD in 6 weeks for follow up  Labs ordered today:      Tommy Medal PharmD CPP [place]  [address] [place], [place] [address] [phone]

## 2022-09-25 NOTE — Patient Instructions (Signed)
Return for a a follow up appointment January 18 at 3:30 pm  Check your blood pressure at home at least 2-3 times each week and keep record of the readings.  Take your BP meds as follows:  Stop amlodipine, hydrochlorothiazide and losartan.  Start olmesartan/amlodipine/hydrochlorothiazide 40/10/25 mg once daily in the mornings  Continue with labetalol three times daily  Bring all of your meds, your BP cuff and your record of home blood pressures to your next appointment.  Exercise as you're able, try to walk approximately 30 minutes per day.  Keep salt intake to a minimum, especially watch canned and prepared boxed foods.  Eat more fresh fruits and vegetables and fewer canned items.  Avoid eating in fast food restaurants.    HOW TO TAKE YOUR BLOOD PRESSURE: Rest 5 minutes before taking your blood pressure.  Don't smoke or drink caffeinated beverages for at least 30 minutes before. Take your blood pressure before (not after) you eat. Sit comfortably with your back supported and both feet on the floor (don't cross your legs). Elevate your arm to heart level on a table or a desk. Use the proper sized cuff. It should fit smoothly and snugly around your bare upper arm. There should be enough room to slip a fingertip under the cuff. The bottom edge of the cuff should be 1 inch above the crease of the elbow. Ideally, take 3 measurements at one sitting and record the average.

## 2022-10-17 ENCOUNTER — Encounter: Payer: Self-pay | Admitting: Pharmacist Clinician (PhC)/ Clinical Pharmacy Specialist

## 2022-10-17 MED ORDER — OLMESARTAN-AMLODIPINE-HCTZ 40-10-25 MG PO TABS: 1.0000 | ORAL_TABLET | Freq: Every day | ORAL | 6 refills | Status: DC

## 2022-10-17 NOTE — Assessment & Plan Note (Signed)
Assessment: BP is uncontrolled in office BP 154/78 mmHg;  above the goal (<130/80). Tolerates losartan, hctz and amlodipine well without any side effects Denies SOB, palpitation, chest pain, headaches,or swelling Reiterated the importance of regular exercise and low salt diet   Plan:  Stop taking losartan, hctz and amlodipine  Start taking combination tablet olmesartan/amlodipine/hctz 40/10/25 Continue taking labaetalol Patient to keep record of BP readings with heart rate and report to [place] at the next visit Patient to follow up with PharmD in 6 weeks for follow up  Labs ordered today:

## 2022-10-23 ENCOUNTER — Telehealth: Payer: Self-pay | Admitting: Orthopaedic Surgery

## 2022-10-23 DIAGNOSIS — G5601 Carpal tunnel syndrome, right upper limb: Secondary | ICD-10-CM

## 2022-10-23 NOTE — Telephone Encounter (Signed)
Patient states she had a referral to have surgery and she received a call about scheduling and she never got a call back..she was seen by Benjiman Core 12/23. Please advise..[phone]

## 2022-10-23 NOTE — Telephone Encounter (Signed)
Can you tell me what I should do with this? Per Jeneen Rinks note, he was going to refer patient to Dr. Amedeo Plenty and actually spoke with him by phone and Dr. Amedeo Plenty was going to have someone reach out to make patient an appointment. Unfortunately, there is not a referral in chart.

## 2022-10-27 ENCOUNTER — Telehealth: Payer: Self-pay | Admitting: Orthopaedic Surgery

## 2022-10-27 NOTE — Telephone Encounter (Signed)
I left voicemail for patient. I explained that unfortunately, referral was not placed in system. I also explained that Jeneen Rinks is no longer here at this facility, so I am unable to enter a referral from him. Asked patient if she would like to see someone in our office for carpal tunnel release, or if she would rather see Dr. Amedeo Plenty as Jeneen Rinks had discussed. Explained that if she would prefer to see Dr. Amedeo Plenty, I can print office note for her to pick up and she can call [facility] to schedule her own appt. Per Jeneen Rinks office note, he spoke with Dr. Amedeo Plenty by phone. Asked patient to return call informing us of what she would like to do and how to proceed.

## 2022-10-27 NOTE — Telephone Encounter (Signed)
Pt called and stated spoke with [facility] and would like to see someone in our office. Please call pt at [phone].

## 2022-10-28 NOTE — Telephone Encounter (Signed)
I called and left voicemail for patient. Appointment made for 11/07/2022 at 1:15pm. Asked patient to return call if she needs to change appointment.

## 2022-11-06 ENCOUNTER — Ambulatory Visit: Payer: Medicaid Other | Attending: Cardiology | Admitting: Student

## 2022-11-06 VITALS — BP 156/78 | HR 65

## 2022-11-06 DIAGNOSIS — I10 Essential (primary) hypertension: Secondary | ICD-10-CM

## 2022-11-06 DIAGNOSIS — I351 Nonrheumatic aortic (valve) insufficiency: Secondary | ICD-10-CM

## 2022-11-06 NOTE — Assessment & Plan Note (Signed)
Assessment: BP is uncontrolled (goal <130/80) in office 1st BP 141/80 and went up the 2nd time to 156/78 with heart rate 65 however home ~ 127/80-85 (cuff is not validated) By mistake patient was taking both olmesartan and losartan. Informed to stop losartan as it was replaced with olmesartan Takes BP mediations regularly and tolerates them well without any side effects  Denies SOB, palpitation, chest pain, headaches,or swelling Reiterated the importance of regular exercise and low salt diet   Plan:  Stop taking losartan will assess the renal function and potassium today if come back WNL may add spironolactone to the current regimen or switch HCTZ to chlorthalidone  Continue taking Olmesartan 40 mg amlodipine 10 mg and HCTZ 25 mg, labetalol 200 mg three times daily  Patient to keep record of BP readings with heart rate and report to [place] at the next visit  Bring home cuff for validation at the next OV Patient to see PharmD in 3 weeks for follow up  May repeat lab in 2 weeks if change the thiazide diuretics or add MRA

## 2022-11-06 NOTE — Patient Instructions (Addendum)
Changes made by your pharmacist Cammy Copa, PharmD at today's visit:    Instructions/Changes  (what do you need to do) Your Notes  (what you did and when you did it)  Continue taking Olmesartan 40 mg amlodipine 10 mg and HCTZ 25 mg, labetalol 200 mg three times daily    2.   Stop taking Losartan 100 mg    3.   Start reading food labels and pay attention to sodium - try to limit sodium, pick low sodium food option     Bring all of your meds, your BP cuff and your record of home blood pressures to your next appointment.    HOW TO TAKE YOUR BLOOD PRESSURE AT HOME  Rest 5 minutes before taking your blood pressure.  Don't smoke or drink caffeinated beverages for at least 30 minutes before. Take your blood pressure before (not after) you eat. Sit comfortably with your back supported and both feet on the floor (don't cross your legs). Elevate your arm to heart level on a table or a desk. Use the proper sized cuff. It should fit smoothly and snugly around your bare upper arm. There should be enough room to slip a fingertip under the cuff. The bottom edge of the cuff should be 1 inch above the crease of the elbow. Ideally, take 3 measurements at one sitting and record the average.  Important lifestyle changes to control high blood pressure  Intervention  Effect on the BP  Lose extra pounds and watch your waistline Weight loss is one of the most effective lifestyle changes for controlling blood pressure. If you're overweight or obese, losing even a small amount of weight can help reduce blood pressure. Blood pressure might go down by about 1 millimeter of mercury (mm Hg) with each kilogram (about 2.2 pounds) of weight lost.  Exercise regularly As a general goal, aim for at least 30 minutes of moderate physical activity every day. Regular physical activity can lower high blood pressure by about 5 to 8 mm Hg.  Eat a healthy diet Eating a diet rich in whole grains, fruits, vegetables, and  low-fat dairy products and low in saturated fat and cholesterol. A healthy diet can lower high blood pressure by up to 11 mm Hg.  Reduce salt (sodium) in your diet Even a small reduction of sodium in the diet can improve heart health and reduce high blood pressure by about 5 to 6 mm Hg.  Limit alcohol One drink equals 12 ounces of beer, 5 ounces of wine, or 1.5 ounces of 80-proof liquor.  Limiting alcohol to less than one drink a day for women or two drinks a day for men can help lower blood pressure by about 4 mm Hg.   If you have any questions or concerns please use My Chart to send questions or call the office at [phone]

## 2022-11-06 NOTE — Progress Notes (Signed)
Patient ID: Danielle Kent                 DOB: August 23, 1986                      MRN: [id number]      HPI: Danielle Kent is a 37 y.o. female referred by Dr. Percival Spanish  to HTN clinic. PMH is significant for aortic valve regurgitation, hypertension, morbid obesity, Pre-diabetes    Patient presented today for her BP follow up. Reports her home BP ~ 127/89. Forgot to bring log. Her arm cuff is normal size so use it on her forearm. She is trying to loose weight therough diet modification and exercise. She has been able to do regular walks but not so much dietary changes. She is perry good at remembering to take her mediations on time. Once in while she forgets taking her mid day labetalol. She does not add salt to her recipes and never add salt to her cooked food. However she does not always pay attention to sodium content on prepackaged food. Reports she takes her 3 pill combo in the morning with labetalol and around 3 pm she takes 2nd dose of labetalol and she takes 3rd dose at night with losartan. I informed patient to discontinue night time losartan since we switch that to olmesartan.     Social History:  Smoking: none Alcohol: none  Diet:  eats home cooked food, use no to low salt in her recepies and never add salt to the cooked food, does not eat out or does not eat fried food. She craves sweets ([employer] Peanut butter cups) some times   Exercise: walk 30 min/ day if weather permits   Home BP readings: ~ 127/80-89   Wt Readings from Last 3 Encounters:  08/21/22 (!) 343 lb (155.6 kg)  06/27/22 (!) 346 lb (156.9 kg)  02/18/22 (!) 350 lb (158.8 kg)   BP Readings from Last 3 Encounters:  11/06/22 (!) 156/78  09/25/22 (!) 154/78  08/21/22 (!) 164/75   Pulse Readings from Last 3 Encounters:  11/06/22 65  09/25/22 84  08/21/22 89    Renal function: CrCl cannot be calculated (Patient's most recent lab result is older than the maximum 21 days allowed.).  Past Medical History:  Diagnosis Date    Asthma    Gestational diabetes 2020   Hypertension     Current Outpatient Medications on File Prior to Visit  Medication Sig Dispense Refill   atorvastatin (LIPITOR) 40 MG tablet Take 40 mg by mouth daily.     labetalol (NORMODYNE) 200 MG tablet Take 1 tablet (200 mg total) by mouth 3 (three) times daily. 270 tablet 3   Olmesartan-amLODIPine-HCTZ 40-10-25 MG TABS Take 1 tablet by mouth daily. 30 tablet 6   No current facility-administered medications on file prior to visit.    No Known Allergies  Blood pressure (!) 156/78, pulse 65, SpO2 99 %.  Overview Hypertension  Current HTN meds: Olmesartan 40 mg amlodipine 10 mg and HCTZ 25 mg, labetalol 200 mg three times daily  Previously tried: losartan- insufficient response  BP goal: <130/80   Essential hypertension Assessment: BP is uncontrolled (goal <130/80) in office 1st BP 141/80 and went up the 2nd time to 156/78 with heart rate 65 however home ~ 127/80-85 (cuff is not validated) By mistake patient was taking both olmesartan and losartan. Informed to stop losartan as it was replaced with olmesartan Takes BP mediations regularly and tolerates them well  without any side effects  Denies SOB, palpitation, chest pain, headaches,or swelling Reiterated the importance of regular exercise and low salt diet   Plan:  Stop taking losartan will assess the renal function and potassium today if come back WNL may add spironolactone to the current regimen or switch HCTZ to chlorthalidone  Continue taking Olmesartan 40 mg amlodipine 10 mg and HCTZ 25 mg, labetalol 200 mg three times daily  Patient to keep record of BP readings with heart rate and report to [place] at the next visit  Bring home cuff for validation at the next OV Patient to see PharmD in 3 weeks for follow up  May repeat lab in 2 weeks if change the thiazide diuretics or add MRA  HYPERTENSION CONTROL Vitals:   11/06/22 1704 11/06/22 1705  BP: (!) 141/80 (!) 156/78    The  patient's blood pressure is elevated above target today.  In order to address the patient's elevated BP: A current anti-hypertensive medication was adjusted today.; Blood pressure will be monitored at home to determine if medication changes need to be made.     Thank you  Cammy Copa, Pharm.D [facility] Division of [facility] [address], [place], [place] [address]  Phone: [phone]; Fax: [phone]

## 2022-11-07 ENCOUNTER — Ambulatory Visit (INDEPENDENT_AMBULATORY_CARE_PROVIDER_SITE_OTHER): Payer: Medicaid Other | Admitting: Orthopaedic Surgery

## 2022-11-07 ENCOUNTER — Encounter: Payer: Self-pay | Admitting: Orthopaedic Surgery

## 2022-11-07 VITALS — BP 171/76 | HR 89 | Ht 60.0 in | Wt 343.0 lb

## 2022-11-07 DIAGNOSIS — G5601 Carpal tunnel syndrome, right upper limb: Secondary | ICD-10-CM

## 2022-11-07 LAB — BASIC METABOLIC PANEL
BUN/Creatinine Ratio: 22 (ref 9–23)
BUN: 29 mg/dL — ABNORMAL HIGH (ref 6–20)
CO2: 27 mmol/L (ref 20–29)
Calcium: 9.8 mg/dL (ref 8.7–10.2)
Chloride: 98 mmol/L (ref 96–106)
Creatinine, Ser: 1.3 mg/dL — ABNORMAL HIGH (ref 0.57–1.00)
Glucose: 94 mg/dL (ref 70–99)
Potassium: 4.1 mmol/L (ref 3.5–5.2)
Sodium: 139 mmol/L (ref 134–144)
eGFR: 54 mL/min/{1.73_m2} — ABNORMAL LOW (ref >59)

## 2022-11-07 NOTE — Progress Notes (Signed)
Office Visit Note   Patient: Danielle Kent           Date of Birth: [birth year]           MRN: [id number] Visit Date: 11/07/2022              Requested by: Danielle Kent, [address] [place],  [place] [address] PCP: Danielle Man, FNP   Assessment & Plan: Visit Diagnoses:  1. Carpal tunnel syndrome, right upper limb     Plan: Patient is symptomatic with severe carpal tunnel syndrome persist symptoms despite conservative treatment and splinting.  Plan would be IV sedation with outpatient carpal tunnel release.  Will check with her cardiologist Danielle Kent.  Patient would likely receive some Versed and fentanyl with local anesthesia.  Office follow-up will be 1 week postop.  Plan procedure discussed with patient risks of surgery discussed technique discussed postoperative care discussed.  Questions were elicited and answered.  She understands and request to proceed.  Follow-Up Instructions: No follow-ups on file.   Orders:  No orders of the defined types were placed in this encounter.  No orders of the defined types were placed in this encounter.     Procedures: No procedures performed   Clinical Data: No additional findings.   Subjective: Chief Complaint  Patient presents with   Right Hand - Numbness    HPI 37 year old female right-hand-dominant with chronic right hand numbness that wakes her up at night she is worn a splint and has had electrical test which shows moderate to severe carpal tunnel syndrome of the right hand.  She has noted she dropped objects has problems with activities of daily living opening car doors or jars etc.  She denies associated neck pain no fever or chills.  Patient is diagnosis of heart failure with aortic valve regurgitation.  Ejection fraction is 60 to 65%.  Danielle Kent is her cardiologist.  In addition she has had problems with hypertension morbid obesity.  Hypertension during pregnancy with severe preeclampsia.  Review of Systems all  systems noncontributory to HPI.   Objective: Vital Signs: BP (!) 171/76   Pulse 89   Ht 5' (1.524 m)   Wt (!) 343 lb (155.6 kg)   BMI 66.99 kg/m   Physical Exam Constitutional:      Appearance: She is obese.  HENT:     Head: Normocephalic.     Right Ear: External ear normal.     Left Ear: External ear normal.  Eyes:     Extraocular Movements: Extraocular movements intact.  Cardiovascular:     Rate and Rhythm: Normal rate.  Neurological:     Mental Status: She is alert.     Ortho Exam normal pulses at the wrist positive Phalen's test positive carpal compression test slight thenar atrophy and weakness on the right normal on the left.  Specialty Comments:  [facility]  [place], [place]  [place], [place] [address] Tel: [phone] Fax:  [phone] Test Date:  08/05/2022   Patient: Danielle Kent DOB: [birth year] Physician: Danielle Collard Patel,DO  Sex: Female Height: 5' " Ref Phys: Danielle Amber, DO  ID#: [id number]     Technician:      Patient Complaints: This is a 37 year old female referred for evaluation of right hand paresthesias.   NCV & EMG Findings: Extensive electrodiagnostic testing of the right upper extremity shows:  Right median sensory response shows prolonged latency (6.4 ms) and reduced amplitude (4.0 V).  Right  ulnar sensory response is within normal limits. Right median motor response shows prolonged latency (6.5 ms).  Of note, there is evidence of a right Martin-Gruber anastomosis, a normal anatomic variant.  Right ulnar motor response is within normal limits.   Sparse chronic motor axonal loss changes are seen affecting the right abductor pollicis brevis muscle, without accompanied active denervation.   Impression: Right median neuropathy at or distal to the wrist, consistent with a clinical diagnosis of carpal tunnel syndrome.  Overall, these findings are moderate-to-severe in degree electrically.     ___________________________ Danielle Collard  Patel,DO       Nerve Conduction Studies Anti Sensory Summary Table    Stim Site NR Peak (ms) Norm Peak (ms) O-P Amp (V) Norm O-P Amp  Right Median Anti Sensory (2nd Digit)  33C  Wrist    6.4 <3.4 4.0 >20  Right Ulnar Anti Sensory (5th Digit)  33C  Wrist    2.3 <3.1 37.7 >12    Motor Summary Table    Stim Site NR Onset (ms) Norm Onset (ms) O-P Amp (mV) Norm O-P Amp Site1 Site2 Delta-0 (ms) Dist (cm) Vel (m/s) Norm Vel (m/s)  Right Median Motor (Abd Poll Brev)  33C  Wrist    6.5 <3.9 7.8 >6 Elbow Wrist 2.3 28.0 122 >50  Elbow    8.8   5.6   Ulnar-wrist crossover Elbow 4.6 0.0      Ulnar-wrist crossover    4.2   1.4                Right Ulnar Motor (Abd Dig Minimi)  33C  Wrist    2.2 <3.1 12.3 >7 B Elbow Wrist 3.7 25.0 68 >50  B Elbow    5.9   11.8   A Elbow B Elbow 1.4 10.0 71 >50  A Elbow    7.3   11.2                  EMG    Side Muscle Ins Act Fibs Fasc Recrt Dur. Amp. Poly. Activation Comment  Right 1stDorInt Nml Nml Nml Nml Nml Nml Nml Nml N/A  Right Abd Poll Brev Nml Nml Nml 1- 1+ 1+ 1+ Nml N/A  Right PronatorTeres Nml Nml Nml Nml Nml Nml Nml Nml N/A  Right Biceps Nml Nml Nml Nml Nml Nml Nml Nml N/A  Right Triceps Nml Nml Nml Nml Nml Nml Nml Nml N/A  Right Deltoid Nml Nml Nml Nml Nml Nml Nml Nml N/A          Waveforms:          Imaging: No results found.   PMFS History: Patient Active Problem List   Diagnosis Date Noted   Carpal tunnel syndrome, right upper limb 11/07/2022   Aortic valve regurgitation    Murmur 11/02/2021   Hypertensive urgency 02/13/2021   Acute CHF (congestive heart failure) ([place]) 02/12/2021   Hypertensive emergency 02/12/2021   Acute pulmonary edema ([place])    Paresthesia of right upper extremity 01/17/2020   Essential hypertension 12/30/2019   Prediabetes 12/30/2019   Morbid obesity ([place]) 12/30/2019   BMI 70 and over, adult ([place]) 03/15/2019   Chronic renal insufficiency 03/15/2019   Severe hypertension    Chronic hypertension  with superimposed severe preeclampsia 02/23/2019   Preexisting diabetes complicating pregnancy, antepartum 12/27/2018   Chronic hypertension during pregnancy, antepartum 12/06/2018   Maternal morbid obesity, antepartum ([place]) 12/06/2018   Past Medical History:  Diagnosis Date   Asthma  Gestational diabetes 2020   Hypertension     Family History  Problem Relation Age of Onset   Diabetes Mother    Hypertension Mother    Multiple sclerosis Mother    Asthma Father    Heart attack Maternal Grandfather 34    Past Surgical History:  Procedure Laterality Date   CESAREAN SECTION     CESAREAN SECTION WITH BILATERAL TUBAL LIGATION N/A 04/03/2019   Procedure: CESAREAN SECTION WITH BILATERAL TUBAL LIGATION;  Surgeon: Florian Buff, MD;  Location: [facility] LD ORS;  Service: Obstetrics;  Laterality: N/A;   TEE WITHOUT CARDIOVERSION N/A 01/28/2022   Procedure: TRANSESOPHAGEAL ECHOCARDIOGRAM (TEE);  Surgeon: Sanda Klein, MD;  Location: [facility] ENDOSCOPY;  Service: Cardiovascular;  Laterality: N/A;   Social History   Occupational History   Not on file  Tobacco Use   Smoking status: Never   Smokeless tobacco: Never  Vaping Use   Vaping Use: Never used  Substance and Sexual Activity   Alcohol use: Not Currently   Drug use: Not Currently   Sexual activity: Yes    Birth control/protection: None

## 2022-11-12 ENCOUNTER — Telehealth: Payer: Self-pay | Admitting: *Deleted

## 2022-11-12 NOTE — Telephone Encounter (Signed)
   Pre-operative Risk Assessment    Patient Name: Danielle Kent  DOB: Feb 06, 1986 MRN: [id number]      Request for Surgical Clearance    Procedure:   RIGHT CARPAL TUNNEL RELEASE  Date of Surgery:  Clearance TBD                                 Surgeon:  DR. MARK YATES Surgeon's Group or Practice Name:  [facility] Phone number:  [phone] Fax number:  [phone] ATTN: SHERRIE   Type of Clearance Requested:   - Medical ; NO MEDICATIONS LISTED AS NEEDING TO BE HELD   Type of Anesthesia:  MAC   Additional requests/questions:    Danielle Kent   11/12/2022, 2:31 PM

## 2022-11-12 NOTE — Telephone Encounter (Signed)
   Name: Danielle Kent  DOB: Jan 31, 1986  MRN: [id number]  Primary Cardiologist: Minus Breeding, MD  Chart reviewed as part of pre-operative protocol coverage. Because of Danielle Kent's past medical history and time since last visit, she will require a follow-up in-office visit in order to better assess preoperative cardiovascular risk.  Patient following with hypertension clinic Pharm.D. for ongoing elevated BP.  Pre-op covering staff: - Please schedule appointment and call patient to inform them. If patient already had an upcoming appointment within acceptable timeframe, please add "pre-op clearance" to the appointment notes so provider is aware. - Please contact requesting surgeon's office via preferred method (i.e, phone, fax) to inform them of need for appointment prior to surgery.    Lenna Sciara, NP  11/12/2022, 3:44 PM

## 2022-11-12 NOTE — Telephone Encounter (Signed)
Pt has been scheduled to see Sande Rives, [facility] 11/18/22 @ 8:50 at [facility] location. Pt is grateful for the help.

## 2022-11-13 NOTE — Progress Notes (Signed)
Cardiology Office Note:    Date:  11/18/2022   ID:  Danielle Kent, DOB 02-23-1986, MRN [id number]  PCP:  Joycelyn Man, FNP  Cardiologist:  Minus Breeding, MD  Electrophysiologist:  None   Referring MD: Joycelyn Man, FNP   Chief Complaint: pre-op evaluation  History of Present Illness:    Danielle Kent is a 37 y.o. female with a history of severe aortic insufficiency, hypertension, hyperlipidemia, gestational diabetes, obstructive sleep apnea, and morbid obesity who is followed by Dr. Percival Spanish and presents today for pre-op evaluation.  Patient was first seen by Cardiology in 11/2018 during an admission for chest pain. She was [redacted] weeks pregnant at that time and had uncontrolled hypertension. She reported chronic chest pain since she was in her 7s. Echo at that time showed LVEF of 55-60% with normal wall motion, grade 2 diastolic dysfunction, and no valvular disease. She was referred to Dr. Percival Spanish in 10/2021 for further evaluation of a heart murmur at which time she was doing well from a cardiac standpoint. She was noted to have a diastolic murmur on exam consistent with aortic insufficiency. Repeat Echo was ordered and showed LVEF of 60-65% with moderate LVH, normal diastolic parameters, and severe AI. 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 Given normal LV size and function and the fact that patient was asymptomatic with this, plan was to continue to monitor this closely with the understanding that she will eventually need valve replacement. Weight loss was also encouraged prior to valve surgery. Last Echo in 07/2022 showed LVEF of 60-65% with severe AI. She was last seen by Dr. Percival Spanish in 08/2022 at which time she reported an episode of sharp chest pain the day prior to visit but this was an isolated event. Her BP was above goal but  she had not been taking her medications. She was advised to take medications and keep a good BP log. Prior sleep study showed severe sleep apnea and BiPAP was recommended but she had not been wearing this due to anxiety. In regards to her AI, plan was to continue routine Echos every 6 months. She was recently seen in our PharmD clinic on 11/06/2022 at which time BP was 156/78. However, BP had been better at home. Patient was accidentally taking both Olmesartan and Losartan. She was advised to stop the Losartan and continue to the Olmesartan. Amlodipine, HCTZ, and Labetalol were continued.   Patient presents today for pre-op evaluation for upcoming carpal tunnel surgery. This will be done in the office under MAC. She is doing well. She is using her CPAP machine, seeing a Nutritionist, and trying to exercise. Her BP is much better controlled in the office today at 132/70. She states she has been trying to walk 30 minutes at regularly and is doing well with this. She reports rare very atypical chest pain when walking that she describes as a "sharp piercing" sensation that last a couple of seconds and then goes away. This occurs when she is pumping her arms hard. It does not occur if she is just walking like she usually does. This goes away when she stretches her upper body. She states this does not occur often and she this does not happen every time she walks. This does not sound like cardiac chest pain or angina to me. Possibly musculoskeletal in nature. No  chest pain concerning for angina. No shortness of breath, orthopnea, PND, edema, palpitations, lightheadedness, dizziness, or syncope.  Past Medical History:  Diagnosis Date   Asthma    Gestational diabetes 2020   Hypertension     Past Surgical History:  Procedure Laterality Date   CESAREAN SECTION     CESAREAN SECTION WITH BILATERAL TUBAL LIGATION N/A 04/03/2019   Procedure: CESAREAN SECTION WITH BILATERAL TUBAL LIGATION;  Surgeon: Florian Buff, MD;   Location: [facility] LD ORS;  Service: Obstetrics;  Laterality: N/A;   TEE WITHOUT CARDIOVERSION N/A 01/28/2022   Procedure: TRANSESOPHAGEAL ECHOCARDIOGRAM (TEE);  Surgeon: Sanda Klein, MD;  Location: [facility] ENDOSCOPY;  Service: Cardiovascular;  Laterality: N/A;    Current Medications: Current Meds  Medication Sig   atorvastatin (LIPITOR) 40 MG tablet Take 40 mg by mouth daily.   labetalol (NORMODYNE) 200 MG tablet Take 1 tablet (200 mg total) by mouth 3 (three) times daily.   Olmesartan-amLODIPine-HCTZ 40-10-25 MG TABS Take 1 tablet by mouth daily.     Allergies:   Patient has no known allergies.   Social History   Socioeconomic History   Marital status: Single    Spouse name: Not on file   Number of children: Not on file   Years of education: Not on file   Highest education level: Not on file  Occupational History   Not on file  Tobacco Use   Smoking status: Never   Smokeless tobacco: Never  Vaping Use   Vaping Use: Never used  Substance and Sexual Activity   Alcohol use: Not Currently   Drug use: Not Currently   Sexual activity: Yes    Birth control/protection: None  Other Topics Concern   Not on file  Social History Narrative   Lives with two daughters.  Works from home.        Right Handed    Lives in one story home   Social Determinants of Health   Financial Resource Strain: Not on file  Food Insecurity: Not on file  Transportation Needs: Not on file  Physical Activity: Not on file  Stress: Not on file  Social Connections: Not on file     Family History: The patient's family history includes Asthma in her father; Diabetes in her mother; Heart attack (age of onset: 47) in her maternal grandfather; Hypertension in her mother; Multiple sclerosis in her mother.  ROS:   Please see the history of present illness.     EKGs/Labs/Other Studies Reviewed:    The following studies were reviewed:  TEE 01/28/2022: Impressions: 1. Left ventricular ejection fraction, by  estimation, is 65 to 70%. The  left ventricle has normal function. There is moderate concentric left  ventricular hypertrophy.   2. Right ventricular systolic function is normal. The right ventricular  size is normal.   3. No left atrial/left atrial appendage thrombus was detected.   4. The mitral valve is normal in structure. No evidence of mitral valve  regurgitation.   5. There appears to be a 3 mm perforation in the mid portion of the  commissural aspect of the noncoronary aortic cusp. Aortic insufficiency  vena contracta 6 mm. The aortic regurgitant jet is highly eccentric,  anteriorly directed. Effeftive regurgitant  orifice area is 0.4 cm sq. There is holodiastolic reversal of flow in the  descending thoracic aorta. The aortic valve is tricuspid. Aortic valve  regurgitation is severe. No aortic stenosis is present. Aortic  regurgitation PHT measures 252 msec.   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 The  cause of this is unclear. Consider healed endocarditis.  _______________  TTE 08/14/2022: Impressions: 1. Left ventricular ejection fraction, by estimation, is 60 to 65%. The  left ventricle has normal function. The left ventricle has no regional  wall motion abnormalities. There is mild concentric left ventricular  hypertrophy. Left ventricular diastolic  parameters are indeterminate.   2. Right ventricular systolic function is normal. The right ventricular  size is normal. Tricuspid regurgitation signal is inadequate for assessing  PA pressure.   3. The mitral valve is normal in structure. No evidence of mitral valve  regurgitation. No evidence of mitral stenosis.   4. The aortic valve is tricuspid. There is mild calcification of the  aortic valve. Aortic valve regurgitation is severe. No aortic stenosis is  present.   5. The inferior vena cava is normal in size with  greater than 50%  respiratory variability, suggesting right atrial pressure of 3 mmHg.     EKG:  EKG ordered today. EKG personally reviewed and demonstrates normal sinus rhythm, rate 81 pm, LVH but no acute ST/T changes. Left axis deviation. Normal PR and QRS intervals. QTc 420 ms.  Recent Labs: 01/24/2022: Hemoglobin 12.9; Platelets 346 11/06/2022: BUN 29; Creatinine, Ser 1.30; Potassium 4.1; Sodium 139  Recent Lipid Panel    Component Value Date/Time   CHOL 192 12/29/2019 1141   TRIG 194 (H) 12/29/2019 1141   HDL 46 12/29/2019 1141   CHOLHDL 4.2 12/29/2019 1141   LDLCALC 112 (H) 12/29/2019 1141    Physical Exam:    Vital Signs: BP 132/70 (BP Location: Right Wrist, Patient Position: Sitting, Cuff Size: Large)   Pulse 81   Ht 5' (1.524 m)   Wt (!) 347 lb (157.4 kg)   BMI 67.77 kg/m     Wt Readings from Last 3 Encounters:  11/18/22 (!) 347 lb (157.4 kg)  11/07/22 (!) 343 lb (155.6 kg)  08/21/22 (!) 343 lb (155.6 kg)     General: 37 y.o. morbidly obese African-American female in no acute distress. HEENT: Normocephalic and atraumatic. Sclera clear. EOMs intact. Neck: Supple. No carotid bruits. No JVD. Heart: RRR. Soft murmur noted. Radial pulses 2+ and equal bilaterally. Lungs: No increased work of breathing. Clear to ausculation bilaterally. No wheezes, rhonchi, or rales.  Abdomen: Soft, non-distended, and non-tender to palpation.  Extremities: No lower extremity edema.    Skin: Warm and dry. Neuro: Alert and oriented x3. No focal deficits. Psych: Normal affect. Responds appropriately.   Assessment:    1. Pre-op evaluation   2. Severe aortic insufficiency   3. Primary hypertension   4. Hyperlipidemia, unspecified hyperlipidemia type   5. Morbid obesity ([place])     Plan:    Pre-Op Evaluation Patient has upcoming carpal tunnel surgery planned. She is doing well from a cardiac standpoint. She reports very atypical chest pain that last only a couple of seconds and  occurs rarely. This does not sound like cardiac chest pain or angina. No shortness of breath, acute CHF symptoms, palpitations, syncope.  Per Revised Cardiac Risk Index, considered low risk for adverse cardiac events perioperatively but this does not take into account patient's severe aortic insufficiency. However, patient is doing very well from a cardiac standpoint and carpal tunnel surgeries are low risk procedures. Therefore, based on ACC/AHA guidelines, patient would be at acceptable risk for the planned procedure without further cardiovascular testing.  I will route this  recommendation to the requesting party via [place] fax function.  Of note, I did discuss this with Dr. Percival Spanish prior to seeing patient who agreed that she would be okay to proceed with surgery as long as she was doing well from a cardia standpoint.   Severe Aortic Insufficiency Initially diagnosed on Echo in 10/2021. Last Echo in 07/2022 showed LVEF of 60-65% with normal wall motion, mild LVH, and severe aortic insufficiency. She will eventually need valve replacement. However, given LV function and size are normal plan has been for close monitoring with Echos every 6 months. - Plan is for repeat Echo in 02/2023.  Hypertension BP well controlled. - Continue current medications: Olmesartan-Amlodipine-HCTZ 40-10-25mg daily and Labetalol 238m three times daily.   Hyperlipidemia Lipid panel in 05/2022: Total Cholesterol 189, Triglycerides 124, HDL 49, LDL 118. - Continue Lipitor 444mdaily.  - Labs followed by PCP. - Did not discuss in detail today.  Morbid Obesity BMI 67.77. Patient is trying to lose weight. She is seeing a Nutritionist and trying to exercise. - Continue to work on weight loss.   Disposition: Follow up with Dr. HoPercival Spanishfter Echo in 02/2023 as scheduled.   Total Time: Total time spent on day of encounter (including. pre charting, reviewing labs, seeing patient, review wit Dr. HoPercival Spanishdocumenting in Epic) was  35 minutes.    Medication Adjustments/Labs and Tests Ordered: Current medicines are reviewed at length with the patient today.  Concerns regarding medicines are outlined above.  Orders Placed This Encounter  Procedures   EKG 12-Lead   No orders of the defined types were placed in this encounter.   Patient Instructions  Medication Instructions:   Your physician recommends that you continue on your current medications as directed. Please refer to the Current Medication list given to you today.  *If you need a refill on your cardiac medications before your next appointment, please call your pharmacy*  Lab Work: NONE ordered at this time of appointment   If you have labs (blood work) drawn today and your tests are completely normal, you will receive your results only by: [place] you have MyChart) OR A paper copy in the mail If you have any lab test that is abnormal or we need to change your treatment, we will call you to review the results.  Testing/Procedures: NONE ordered at this time of appointment   Follow-Up: [facility] are our [facility].  As part of our continuing mission to provide you with exceptional heart care, we have created designated Provider Care Teams.  These Care Teams include your primary Cardiologist (physician) and Advanced Practice Providers (APPs -  Physician Assistants and Nurse Practitioners) who all work together to provide you with the care you need, when you need it.    Your next appointment:   As previously scheduled    Provider:   JaMinus BreedingMD     Other Instructions You are cleared from a cardiac standpoint for your upcoming procedure    Signed, CaEppie Gibson1/30/2024 12:39 PM    [place]

## 2022-11-18 ENCOUNTER — Ambulatory Visit: Payer: Medicaid Other | Attending: Student | Admitting: Student

## 2022-11-18 ENCOUNTER — Encounter: Payer: Self-pay | Admitting: Student

## 2022-11-18 VITALS — BP 132/70 | HR 81 | Ht 60.0 in | Wt 347.0 lb

## 2022-11-18 DIAGNOSIS — E785 Hyperlipidemia, unspecified: Secondary | ICD-10-CM

## 2022-11-18 DIAGNOSIS — I10 Essential (primary) hypertension: Secondary | ICD-10-CM

## 2022-11-18 DIAGNOSIS — I351 Nonrheumatic aortic (valve) insufficiency: Secondary | ICD-10-CM

## 2022-11-18 DIAGNOSIS — Z01818 Encounter for other preprocedural examination: Secondary | ICD-10-CM

## 2022-11-18 NOTE — Patient Instructions (Signed)
Medication Instructions:   Your physician recommends that you continue on your current medications as directed. Please refer to the Current Medication list given to you today.  *If you need a refill on your cardiac medications before your next appointment, please call your pharmacy*  Lab Work: NONE ordered at this time of appointment   If you have labs (blood work) drawn today and your tests are completely normal, you will receive your results only by: [place] (if you have MyChart) OR A paper copy in the mail If you have any lab test that is abnormal or we need to change your treatment, we will call you to review the results.  Testing/Procedures: NONE ordered at this time of appointment   Follow-Up: At [facility], you and your health needs are our priority.  As part of our continuing mission to provide you with exceptional heart care, we have created designated Provider Care Teams.  These Care Teams include your primary Cardiologist (physician) and Advanced Practice Providers (APPs -  Physician Assistants and Nurse Practitioners) who all work together to provide you with the care you need, when you need it.    Your next appointment:   As previously scheduled    Provider:   Minus Breeding, MD     Other Instructions You are cleared from a cardiac standpoint for your upcoming procedure

## 2022-11-20 ENCOUNTER — Other Ambulatory Visit: Payer: Self-pay | Admitting: Physician Assistant

## 2022-11-20 ENCOUNTER — Encounter (HOSPITAL_COMMUNITY): Payer: Self-pay | Admitting: Orthopaedic Surgery

## 2022-11-20 NOTE — Progress Notes (Signed)
PCP - Earnestine Leys, FNP Cardiologist - Dr Percival Spanish  Chest x-ray - n/a EKG - 11/18/22 Stress Test - n/a ECHO - 08/14/22 Cardiac Cath - n/a  ICD Pacemaker/Loop - n/a  Sleep Study -  Yes, 01/2022 CPAP - uses CPAP  Diabetes - n/a  ERAS: Clear liquids til 8:40 AM DOS.  Anesthesia review: Yes  STOP now taking any Aspirin (unless otherwise instructed by your surgeon), Aleve, Naproxen, Ibuprofen, Motrin, Advil, Goody's, BC's, all herbal medications, fish oil, and all vitamins.   Coronavirus Screening Do you have any of the following symptoms:  Cough yes/no: No Fever (>100.76F)  yes/no: No Runny nose yes/no: No Sore throat yes/no: No Difficulty breathing/shortness of breath  yes/no: No  Have you traveled in the last 14 days and where? yes/no: No  Patient verbalized understanding of instructions that were given via phone.

## 2022-11-21 NOTE — Progress Notes (Signed)
Anesthesia Chart Review: Same day workup  Follows with cardiology for history of HTN, HLD, OSA on CPAP, morbid obesity, severe aortic insufficiency.  Aortic insufficiency initially diagnosed on Echo in 10/2021. Last Echo in 07/2022 showed LVEF of 60-65% with normal wall motion, mild LVH, and severe aortic insufficiency. She will eventually need valve replacement. However, given LV function and size are normal plan has been for close monitoring with Echos every 6 months.  Patient was last seen by Sande Rives, PA-C on 11/18/2022 for preop evaluation.  Per note, "Patient has upcoming carpal tunnel surgery planned. She is doing well from a cardiac standpoint. She reports very atypical chest pain that last only a couple of seconds and occurs rarely. This does not sound like cardiac chest pain or angina. No shortness of breath, acute CHF symptoms, palpitations, syncope.  Per Revised Cardiac Risk Index, considered low risk for adverse cardiac events perioperatively but this does not take into account patient's severe aortic insufficiency. However, patient is doing very well from a cardiac standpoint and carpal tunnel surgeries are low risk procedures. Therefore, based on ACC/AHA guidelines, patient would be at acceptable risk for the planned procedure without further cardiovascular testing.  I will route this recommendation to the requesting party via Epic fax function. Of note, I did discuss this with Dr. Percival Spanish prior to seeing patient who agreed that she would be okay to proceed with surgery as long as she was doing well from a cardia standpoint."  BMP 11/06/2022 reviewed, mildly elevated creatinine at 1.30, otherwise unremarkable.  Patient will need day of surgery evaluation.  EKG 11/18/2022: NSR.  LAD.  Minimal voltage criteria for LVH, may be normal variant.  Rate 81.  TTE 08/14/2022:  1. Left ventricular ejection fraction, by estimation, is 60 to 65%. The  left ventricle has normal function. The left  ventricle has no regional  wall motion abnormalities. There is mild concentric left ventricular  hypertrophy. Left ventricular diastolic  parameters are indeterminate.   2. Right ventricular systolic function is normal. The right ventricular  size is normal. Tricuspid regurgitation signal is inadequate for assessing  PA pressure.   3. The mitral valve is normal in structure. No evidence of mitral valve  regurgitation. No evidence of mitral stenosis.   4. The aortic valve is tricuspid. There is mild calcification of the  aortic valve. Aortic valve regurgitation is severe. No aortic stenosis is  present.   5. The inferior vena cava is normal in size with greater than 50%  respiratory variability, suggesting right atrial pressure of 3 mmHg.      Wynonia Musty [facility]/Anesthesiology Phone [phone] 11/21/2022 8:58 AM

## 2022-11-21 NOTE — Anesthesia Preprocedure Evaluation (Signed)
Anesthesia Evaluation  Patient identified by MRN, date of birth, ID band Patient awake    Reviewed: Allergy & Precautions, NPO status , Patient's Chart, lab work & pertinent test results, reviewed documented beta blocker date and time   History of Anesthesia Complications (+) POST - OP SPINAL HEADACHE and history of anesthetic complications  Airway Mallampati: III  TM Distance: >3 FB Neck ROM: Full    Dental  (+) Dental Advisory Given   Pulmonary asthma , sleep apnea and Continuous Positive Airway Pressure Ventilation    Pulmonary exam normal        Cardiovascular hypertension, Pt. on medications and Pt. on home beta blockers +CHF  + Valvular Problems/Murmurs AI  Rhythm:Regular Rate:Normal + Diastolic murmurs Echo 02/5373 1. The aortic valve was not well visualized. Aortic valve regurgitation is severe. No aortic stenosis is present. Aortic regurgitation appears primary in morphology. There is some diastoloic flow revereal in the descending aorta.  2. Left ventricular ejection fraction, by estimation, is 60 to 65%. The left ventricle has normal function. The left ventricle has no regional wall motion abnormalities. There is moderate concentric left ventricular hypertrophy. Left ventricular diastolic parameters were normal. The average left ventricular global longitudinal strain is -23.2%. The global longitudinal strain is normal.  3. Right ventricular systolic function is normal. The right ventricular size is normal. Tricuspid regurgitation signal is inadequate for assessing PA pressure.  4. The mitral valve is normal in structure. No evidence of mitral valve regurgitation. No evidence of mitral stenosis.   Comparison(s): 11/26/18 EF 55-60%. Significant changes have occured: no significant aortic regurgitation seein in 2020 study.    Neuro/Psych    GI/Hepatic   Endo/Other  diabetes, Type 2  Morbid obesity  Renal/GU Renal disease      Musculoskeletal   Abdominal  (+) + obese  Peds  Hematology   Anesthesia Other Findings   Reproductive/Obstetrics                             Anesthesia Physical Anesthesia Plan  ASA: 4  Anesthesia Plan: General   Post-op Pain Management: Minimal or no pain anticipated and Tylenol PO (pre-op)*   Induction: Intravenous  PONV Risk Score and Plan: 4 or greater and Ondansetron, Dexamethasone, Midazolam and Treatment may vary due to age or medical condition  Airway Management Planned: LMA and Oral ETT  Additional Equipment:   Intra-op Plan:   Post-operative Plan: Extubation in OR  Informed Consent: I have reviewed the patients History and Physical, chart, labs and discussed the procedure including the risks, benefits and alternatives for the proposed anesthesia with the patient or authorized representative who has indicated his/her understanding and acceptance.     Dental advisory given  Plan Discussed with: Anesthesiologist and CRNA  Anesthesia Plan Comments: (PAT note by Karoline Caldwell, PA-C: Follows with cardiology for history of HTN, HLD, OSA on CPAP, morbid obesity, severe aortic insufficiency.  Aortic insufficiency initially diagnosed on Echo in 10/2021. Last Echo in 07/2022 showed LVEF of 60-65% with normal wall motion, mild LVH, and severe aortic insufficiency. She will eventually need valve replacement. However, given LV function and size are normal plan has been for close monitoring with Echos every 6 months.  Patient was last seen by Sande Rives, PA-C on 11/18/2022 for preop evaluation.  Per note, "Patient has upcoming carpal tunnel surgery planned.She is doing well from a cardiac standpoint. She reportsvery atypical chest pain that last only a couple  of seconds and occurs rarely.This does not sound like cardiac chest pain or angina. No shortness of breath, acute CHF symptoms, palpitations, syncope.Per Revised Cardiac Risk Index,  considered low risk for adverse cardiac events perioperativelybut this does not take into account patient's severe aortic insufficiency.However, patient is doing very well from a cardiac standpoint and carpal tunnel surgeries are low riskprocedures.Therefore, based on ACC/AHA guidelines, patient would be at acceptable risk for the planned procedure without further cardiovascular testing. I will route this recommendation to the requesting party via Epic fax function. Of note, I did discuss this with Dr. Percival Spanish prior to seeing patient who agreed that she would be okay to proceed with surgery as long as she was doing well from a cardia standpoint."  BMP 11/06/2022 reviewed, mildly elevated creatinine at 1.30, otherwise unremarkable.  Patient will need day of surgery evaluation.  EKG 11/18/2022: NSR.  LAD.  Minimal voltage criteria for LVH, may be normal variant.  Rate 81.  TTE 08/14/2022: 1. Left ventricular ejection fraction, by estimation, is 60 to 65%. The  left ventricle has normal function. The left ventricle has no regional  wall motion abnormalities. There is mild concentric left ventricular  hypertrophy. Left ventricular diastolic  parameters are indeterminate.  2. Right ventricular systolic function is normal. The right ventricular  size is normal. Tricuspid regurgitation signal is inadequate for assessing  PA pressure.  3. The mitral valve is normal in structure. No evidence of mitral valve  regurgitation. No evidence of mitral stenosis.  4. The aortic valve is tricuspid. There is mild calcification of the  aortic valve. Aortic valve regurgitation is severe. No aortic stenosis is  present.  5. The inferior vena cava is normal in size with greater than 50%  respiratory variability, suggesting right atrial pressure of 3 mmHg.   )        Anesthesia Quick Evaluation

## 2022-11-24 ENCOUNTER — Encounter (HOSPITAL_COMMUNITY): Payer: Self-pay | Admitting: Orthopaedic Surgery

## 2022-11-24 ENCOUNTER — Other Ambulatory Visit: Payer: Self-pay

## 2022-11-24 ENCOUNTER — Ambulatory Visit (HOSPITAL_COMMUNITY): Payer: Medicaid Other | Admitting: Physician Assistant

## 2022-11-24 ENCOUNTER — Ambulatory Visit (HOSPITAL_COMMUNITY)
Admission: RE | Admit: 2022-11-24 | Discharge: 2022-11-24 | Disposition: A | Discharge status: Home / Self Care | Payer: Medicaid Other | Source: Ambulatory Visit | Attending: Orthopaedic Surgery | Admitting: Orthopaedic Surgery

## 2022-11-24 ENCOUNTER — Ambulatory Visit (HOSPITAL_BASED_OUTPATIENT_CLINIC_OR_DEPARTMENT_OTHER): Payer: Medicaid Other | Admitting: Physician Assistant

## 2022-11-24 ENCOUNTER — Encounter (HOSPITAL_COMMUNITY)
Admission: RE | Disposition: A | Discharge status: Home / Self Care | Payer: Self-pay | Source: Ambulatory Visit | Attending: Orthopaedic Surgery

## 2022-11-24 ENCOUNTER — Telehealth: Payer: Self-pay | Admitting: Orthopaedic Surgery

## 2022-11-24 DIAGNOSIS — G5601 Carpal tunnel syndrome, right upper limb: Secondary | ICD-10-CM

## 2022-11-24 DIAGNOSIS — I110 Hypertensive heart disease with heart failure: Secondary | ICD-10-CM | POA: Diagnosis not present

## 2022-11-24 DIAGNOSIS — Z6841 Body Mass Index (BMI) 40.0 and over, adult: Secondary | ICD-10-CM | POA: Insufficient documentation

## 2022-11-24 DIAGNOSIS — G629 Polyneuropathy, unspecified: Secondary | ICD-10-CM | POA: Diagnosis not present

## 2022-11-24 DIAGNOSIS — I509 Heart failure, unspecified: Secondary | ICD-10-CM | POA: Insufficient documentation

## 2022-11-24 DIAGNOSIS — I351 Nonrheumatic aortic (valve) insufficiency: Secondary | ICD-10-CM | POA: Insufficient documentation

## 2022-11-24 DIAGNOSIS — G4733 Obstructive sleep apnea (adult) (pediatric): Secondary | ICD-10-CM | POA: Insufficient documentation

## 2022-11-24 DIAGNOSIS — Z9989 Dependence on other enabling machines and devices: Secondary | ICD-10-CM

## 2022-11-24 DIAGNOSIS — E785 Hyperlipidemia, unspecified: Secondary | ICD-10-CM | POA: Insufficient documentation

## 2022-11-24 HISTORY — DX: Hyperlipidemia, unspecified: E78.5

## 2022-11-24 HISTORY — PX: CARPAL TUNNEL RELEASE: SHX101

## 2022-11-24 HISTORY — DX: Sleep apnea, unspecified: G47.30

## 2022-11-24 LAB — CBC
HCT: 38.5 % (ref 36.0–46.0)
Hemoglobin: 12.5 g/dL (ref 12.0–15.0)
MCH: 27.9 pg (ref 26.0–34.0)
MCHC: 32.5 g/dL (ref 30.0–36.0)
MCV: 85.9 fL (ref 80.0–100.0)
Platelets: 319 10*3/uL (ref 150–400)
RBC: 4.48 MIL/uL (ref 3.87–5.11)
RDW: 14.3 % (ref 11.5–15.5)
WBC: 5.8 10*3/uL (ref 4.0–10.5)
nRBC: 0 % (ref 0.0–0.2)

## 2022-11-24 LAB — POCT PREGNANCY, URINE: Preg Test, Ur: NEGATIVE

## 2022-11-24 LAB — GLUCOSE, CAPILLARY: Glucose-Capillary: 100 mg/dL — ABNORMAL HIGH (ref 70–99)

## 2022-11-24 SURGERY — CARPAL TUNNEL RELEASE
Anesthesia: General | Site: Hand | Laterality: Right

## 2022-11-24 MED ORDER — PROPOFOL 10 MG/ML IV BOLUS
INTRAVENOUS | Status: AC
  Filled 2022-11-24: qty 20

## 2022-11-24 MED ORDER — FENTANYL CITRATE (PF) 250 MCG/5ML IJ SOLN
INTRAMUSCULAR | Status: DC | PRN
  Administered 2022-11-24: 50 ug via INTRAVENOUS

## 2022-11-24 MED ORDER — PROPOFOL 10 MG/ML IV BOLUS
INTRAVENOUS | Status: DC | PRN
  Administered 2022-11-24: 50 mg via INTRAVENOUS
  Administered 2022-11-24: 100 mg via INTRAVENOUS
  Administered 2022-11-24: 200 mg via INTRAVENOUS

## 2022-11-24 MED ORDER — BUPIVACAINE HCL 0.25 % IJ SOLN
INTRAMUSCULAR | Status: DC | PRN
  Administered 2022-11-24: 2.5 mL

## 2022-11-24 MED ORDER — CHLORHEXIDINE GLUCONATE 0.12 % MT SOLN
OROMUCOSAL | Status: AC
  Administered 2022-11-24: 15 mL via OROMUCOSAL
  Filled 2022-11-24: qty 15

## 2022-11-24 MED ORDER — ORAL CARE MOUTH RINSE: 15.0000 mL | Freq: Once | OROMUCOSAL | Status: AC

## 2022-11-24 MED ORDER — HYDROCODONE-ACETAMINOPHEN 5-325 MG PO TABS: 1.0000 | ORAL_TABLET | Freq: Four times a day (QID) | ORAL | 0 refills | Status: DC | PRN

## 2022-11-24 MED ORDER — DEXAMETHASONE SODIUM PHOSPHATE 10 MG/ML IJ SOLN
INTRAMUSCULAR | Status: DC | PRN
  Administered 2022-11-24: 10 mg via INTRAVENOUS

## 2022-11-24 MED ORDER — ONDANSETRON HCL 4 MG/2ML IJ SOLN
INTRAMUSCULAR | Status: DC | PRN
  Administered 2022-11-24: 4 mg via INTRAVENOUS

## 2022-11-24 MED ORDER — ACETAMINOPHEN 500 MG PO TABS
ORAL_TABLET | ORAL | Status: AC
  Administered 2022-11-24: 1000 mg via ORAL
  Filled 2022-11-24: qty 2

## 2022-11-24 MED ORDER — CHLORHEXIDINE GLUCONATE 0.12 % MT SOLN: 15.0000 mL | Freq: Once | OROMUCOSAL | Status: AC

## 2022-11-24 MED ORDER — CEFAZOLIN IN SODIUM CHLORIDE 3-0.9 GM/100ML-% IV SOLN
3.0000 g | INTRAVENOUS | Status: AC
  Administered 2022-11-24: 3 g via INTRAVENOUS

## 2022-11-24 MED ORDER — MIDAZOLAM HCL 2 MG/2ML IJ SOLN
INTRAMUSCULAR | Status: DC | PRN
  Administered 2022-11-24: 2 mg via INTRAVENOUS

## 2022-11-24 MED ORDER — SUCCINYLCHOLINE CHLORIDE 200 MG/10ML IV SOSY
PREFILLED_SYRINGE | INTRAVENOUS | Status: DC | PRN
  Administered 2022-11-24: 120 mg via INTRAVENOUS

## 2022-11-24 MED ORDER — ACETAMINOPHEN 500 MG PO TABS: 1000.0000 mg | ORAL_TABLET | Freq: Once | ORAL | Status: AC

## 2022-11-24 MED ORDER — LIDOCAINE HCL 2 % IJ SOLN
INTRAMUSCULAR | Status: AC
  Filled 2022-11-24: qty 20

## 2022-11-24 MED ORDER — MIDAZOLAM HCL 2 MG/2ML IJ SOLN
INTRAMUSCULAR | Status: AC
  Filled 2022-11-24: qty 2

## 2022-11-24 MED ORDER — FENTANYL CITRATE (PF) 250 MCG/5ML IJ SOLN
INTRAMUSCULAR | Status: AC
  Filled 2022-11-24: qty 5

## 2022-11-24 MED ORDER — BUPIVACAINE HCL (PF) 0.25 % IJ SOLN
INTRAMUSCULAR | Status: AC
  Filled 2022-11-24: qty 30

## 2022-11-24 MED ORDER — LACTATED RINGERS IV SOLN: INTRAVENOUS | Status: DC

## 2022-11-24 MED ORDER — LIDOCAINE HCL 2 % IJ SOLN
INTRAMUSCULAR | Status: DC | PRN
  Administered 2022-11-24: 2.5 mL

## 2022-11-24 MED ORDER — CEFAZOLIN IN SODIUM CHLORIDE 3-0.9 GM/100ML-% IV SOLN
INTRAVENOUS | Status: AC
  Filled 2022-11-24: qty 100

## 2022-11-24 SURGICAL SUPPLY — 37 items
BAG COUNTER SPONGE SURGICOUNT (BAG) ×1 IMPLANT
BENZOIN TINCTURE PRP APPL 2/3 (GAUZE/BANDAGES/DRESSINGS) ×1 IMPLANT
BNDG ELASTIC 4X5.8 VLCR STR LF (GAUZE/BANDAGES/DRESSINGS) IMPLANT
BNDG ESMARK 4X9 LF (GAUZE/BANDAGES/DRESSINGS) IMPLANT
CORD BIPOLAR FORCEPS 12FT (ELECTRODE) ×1 IMPLANT
COVER SURGICAL LIGHT HANDLE (MISCELLANEOUS) ×1 IMPLANT
CUFF TOURN SGL QUICK 18X4 (TOURNIQUET CUFF) IMPLANT
CUFF TOURN SGL QUICK 24 (TOURNIQUET CUFF) ×1
CUFF TRNQT CYL 24X4X16.5-23 (TOURNIQUET CUFF) IMPLANT
DRSG XEROFORM 1X8 (GAUZE/BANDAGES/DRESSINGS) IMPLANT
DURAPREP 26ML APPLICATOR (WOUND CARE) ×1 IMPLANT
ELECT REM PT RETURN 9FT ADLT (ELECTROSURGICAL)
ELECTRODE REM PT RTRN 9FT ADLT (ELECTROSURGICAL) IMPLANT
GAUZE SPONGE 4X4 12PLY STRL (GAUZE/BANDAGES/DRESSINGS) IMPLANT
GAUZE XEROFORM 1X8 LF (GAUZE/BANDAGES/DRESSINGS) IMPLANT
GLOVE BIOGEL PI IND STRL 8 (GLOVE) ×2 IMPLANT
GLOVE ORTHO TXT STRL SZ7.5 (GLOVE) ×2 IMPLANT
GOWN STRL REUS W/ TWL LRG LVL3 (GOWN DISPOSABLE) ×1 IMPLANT
GOWN STRL REUS W/ TWL XL LVL3 (GOWN DISPOSABLE) ×1 IMPLANT
GOWN STRL REUS W/TWL 2XL LVL3 (GOWN DISPOSABLE) ×1 IMPLANT
GOWN STRL REUS W/TWL LRG LVL3 (GOWN DISPOSABLE) ×1
GOWN STRL REUS W/TWL XL LVL3 (GOWN DISPOSABLE)
KIT BASIN OR (CUSTOM PROCEDURE TRAY) ×1 IMPLANT
KIT TURNOVER KIT B (KITS) ×1 IMPLANT
MANIFOLD NEPTUNE II (INSTRUMENTS) ×1 IMPLANT
MAT PREVALON FULL STRYKER (MISCELLANEOUS) IMPLANT
NS IRRIG 1000ML POUR BTL (IV SOLUTION) ×1 IMPLANT
PACK ORTHO EXTREMITY (CUSTOM PROCEDURE TRAY) ×1 IMPLANT
PAD ARMBOARD 7.5X6 YLW CONV (MISCELLANEOUS) ×2 IMPLANT
PAD CAST 4YDX4 CTTN HI CHSV (CAST SUPPLIES) IMPLANT
PADDING CAST COTTON 4X4 STRL (CAST SUPPLIES) ×1
STRIP CLOSURE SKIN 1/2X4 (GAUZE/BANDAGES/DRESSINGS) ×1 IMPLANT
SUT ETHILON 4 0 PS 2 18 (SUTURE) ×1 IMPLANT
TOWEL GREEN STERILE (TOWEL DISPOSABLE) ×1 IMPLANT
TOWEL GREEN STERILE FF (TOWEL DISPOSABLE) ×1 IMPLANT
TUBE CONNECTING 12X1/4 (SUCTIONS) IMPLANT
WATER STERILE IRR 1000ML POUR (IV SOLUTION) ×1 IMPLANT

## 2022-11-24 NOTE — Telephone Encounter (Signed)
Patient states she needs a Dr. Radene Ou, and wants it faxed

## 2022-11-24 NOTE — Interval H&P Note (Signed)
History and Physical Interval Note:  11/24/2022 11:40 AM  Danielle Kent  has presented today for surgery, with the diagnosis of right carpal tunnel syndrome.  The various methods of treatment have been discussed with the patient and family. After consideration of risks, benefits and other options for treatment, the patient has consented to  Procedure(s): RIGHT CARPAL TUNNEL RELEASE (Right) as a surgical intervention.  The patient's history has been reviewed, patient examined, no change in status, stable for surgery.  I have reviewed the patient's chart and labs.  Questions were answered to the patient's satisfaction.     Marybelle Killings

## 2022-11-24 NOTE — H&P (Signed)
Patient: Danielle Kent                                   Date of Birth: 1986-07-05                                                      MRN: [id number] Visit Date: 11/07/2022                                                                     Requested by: Joycelyn Man, [address] [place],  [place] [id number] PCP: Joycelyn Man, FNP     Assessment & Plan: Visit Diagnoses:  1. Carpal tunnel syndrome, right upper limb       Plan: Patient is symptomatic with severe carpal tunnel syndrome persist symptoms despite conservative treatment and splinting.  Plan would be IV sedation with outpatient carpal tunnel release.  Will check with her cardiologist Dr. Heber [place].  Patient would likely receive some Versed and fentanyl with local anesthesia.  Office follow-up will be 1 week postop.  Plan procedure discussed with patient risks of surgery discussed technique discussed postoperative care discussed.  Questions were elicited and answered.  She understands and request to proceed.   Follow-Up Instructions: No follow-ups on file.    Orders:  No orders of the defined types were placed in this encounter.   No orders of the defined types were placed in this encounter.        Procedures: No procedures performed     Clinical Data: No additional findings.     Subjective:    Chief Complaint  Patient presents with   Right Hand - Numbness      HPI 37 year old female right-hand-dominant with chronic right hand numbness that wakes her up at night she is worn a splint and has had electrical test which shows moderate to severe carpal tunnel syndrome of the right hand.  She has noted she dropped objects has problems with activities of daily living opening car doors or jars etc.  She denies associated neck pain no fever or chills.  Patient is diagnosis of heart failure with aortic valve regurgitation.  Ejection fraction is 60 to 65%.  Dr. Percival Spanish is her cardiologist.  In addition she has had problems  with hypertension morbid obesity.  Hypertension during pregnancy with severe preeclampsia.   Review of Systems all systems noncontributory to HPI.     Objective: Vital Signs: BP (!) 171/76   Pulse 89   Ht 5' (1.524 m)   Wt (!) 343 lb (155.6 kg)   BMI 66.99 kg/m    Physical Exam Constitutional:      Appearance: She is obese.  HENT:     Head: Normocephalic.     Right Ear: External ear normal.     Left Ear: External ear normal.  Eyes:     Extraocular Movements: Extraocular movements intact.  Cardiovascular:     Rate and Rhythm: Normal rate.  Neurological:     Mental Status: She is  alert.        Ortho Exam normal pulses at the wrist positive Phalen's test positive carpal compression test slight thenar atrophy and weakness on the right normal on the left.   Specialty Comments:  [facility]  [place]  [place], [place] [address] Tel: [phone] Fax:  [phone] Test Date:  08/05/2022   Patient: [name] Reasons DOB: [birth year] Physician: Arvin Collard Patel,DO  Sex: Female Height: 5' " Ref Phys: Narda Amber, DO  ID#: [id number]     Technician:      Patient Complaints: This is a 37 year old female referred for evaluation of right hand paresthesias.   NCV & EMG Findings: Extensive electrodiagnostic testing of the right upper extremity shows:  Right median sensory response shows prolonged latency (6.4 ms) and reduced amplitude (4.0 V).  Right ulnar sensory response is within normal limits. Right median motor response shows prolonged latency (6.5 ms).  Of note, there is evidence of a right Martin-Gruber anastomosis, a normal anatomic variant.  Right ulnar motor response is within normal limits.   Sparse chronic motor axonal loss changes are seen affecting the right abductor pollicis brevis muscle, without accompanied active denervation.   Impression: Right median neuropathy at or distal to the wrist, consistent with a clinical diagnosis of carpal tunnel  syndrome.  Overall, these findings are moderate-to-severe in degree electrically.     ___________________________ Arvin Collard Patel,DO       Nerve Conduction Studies Anti Sensory Summary Table    Stim Site NR Peak (ms) Norm Peak (ms) O-P Amp (V) Norm O-P Amp  Right Median Anti Sensory (2nd Digit)  33C  Wrist    6.4 <3.4 4.0 >20  Right Ulnar Anti Sensory (5th Digit)  33C  Wrist    2.3 <3.1 37.7 >12    Motor Summary Table    Stim Site NR Onset (ms) Norm Onset (ms) O-P Amp (mV) Norm O-P Amp Site1 Site2 Delta-0 (ms) Dist (cm) Vel (m/s) Norm Vel (m/s)  Right Median Motor (Abd Poll Brev)  33C  Wrist    6.5 <3.9 7.8 >6 Elbow Wrist 2.3 28.0 122 >50  Elbow    8.8   5.6   Ulnar-wrist crossover Elbow 4.6 0.0      Ulnar-wrist crossover    4.2   1.4                Right Ulnar Motor (Abd Dig Minimi)  33C  Wrist    2.2 <3.1 12.3 >7 B Elbow Wrist 3.7 25.0 68 >50  B Elbow    5.9   11.8   A Elbow B Elbow 1.4 10.0 71 >50  A Elbow    7.3   11.2                  EMG    Side Muscle Ins Act Fibs Fasc Recrt Dur. Amp. Poly. Activation Comment  Right 1stDorInt Nml Nml Nml Nml Nml Nml Nml Nml N/A  Right Abd Poll Brev Nml Nml Nml 1- 1+ 1+ 1+ Nml N/A  Right PronatorTeres Nml Nml Nml Nml Nml Nml Nml Nml N/A  Right Biceps Nml Nml Nml Nml Nml Nml Nml Nml N/A  Right Triceps Nml Nml Nml Nml Nml Nml Nml Nml N/A  Right Deltoid Nml Nml Nml Nml Nml Nml Nml Nml N/A          Waveforms:           Imaging: No results found.  PMFS History:     Patient Active Problem List    Diagnosis Date Noted   Carpal tunnel syndrome, right upper limb 11/07/2022   Aortic valve regurgitation     Murmur 11/02/2021   Hypertensive urgency 02/13/2021   Acute CHF (congestive heart failure) ([place]) 02/12/2021   Hypertensive emergency 02/12/2021   Acute pulmonary edema ([place])     Paresthesia of right upper extremity 01/17/2020   Essential hypertension 12/30/2019   Prediabetes 12/30/2019   Morbid obesity ([place])  12/30/2019   BMI 70 and over, adult ([place]) 03/15/2019   Chronic renal insufficiency 03/15/2019   Severe hypertension     Chronic hypertension with superimposed severe preeclampsia 02/23/2019   Preexisting diabetes complicating pregnancy, antepartum 12/27/2018   Chronic hypertension during pregnancy, antepartum 12/06/2018   Maternal morbid obesity, antepartum ([place]) 12/06/2018        Past Medical History:  Diagnosis Date   Asthma     Gestational diabetes 2020   Hypertension           Family History  Problem Relation Age of Onset   Diabetes Mother     Hypertension Mother     Multiple sclerosis Mother     Asthma Father     Heart attack Maternal Grandfather 15         Past Surgical History:  Procedure Laterality Date   CESAREAN SECTION       CESAREAN SECTION WITH BILATERAL TUBAL LIGATION N/A 04/03/2019    Procedure: CESAREAN SECTION WITH BILATERAL TUBAL LIGATION;  Surgeon: Florian Buff, MD;  Location: [facility] LD ORS;  Service: Obstetrics;  Laterality: N/A;   TEE WITHOUT CARDIOVERSION N/A 01/28/2022    Procedure: TRANSESOPHAGEAL ECHOCARDIOGRAM (TEE);  Surgeon: Sanda Klein, MD;  Location: [facility] ENDOSCOPY;  Service: Cardiovascular;  Laterality: N/A;    Social History         Occupational History   Not on file  Tobacco Use   Smoking status: Never   Smokeless tobacco: Never  Vaping Use   Vaping Use: Never used  Substance and Sexual Activity   Alcohol use: Not Currently   Drug use: Not Currently   Sexual activity: Yes      Birth control/protection: None

## 2022-11-24 NOTE — Op Note (Signed)
Pre and postop diagnosis: Right carpal tunnel syndrome  Procedure right carpal tunnel release  Anesthesia started LMA, converted to General orotracheal.  Tourniquet time: 5 minutes.  Findings median nerve compression in the right carpal canal.  Procedure: After standard prepping and draping no antibiotics were indicated forearm tourniquet been applied extremity sheets and drapes were applied timeout procedure was completed.  Skin marker was used and radial border of the ring finger and ulnar border of the thumb was used to localize into the carpal canal.  Local was added arm was wrapped in Esmarch tourniquet inflated after timeout procedure incision was made.  There is thick palmar fat transverse carpal ligament was identified after dividing the palmar fascia.  It was opened up following along the ulnar aspect of the median nerve complete release fingertip could be introduced in the distal forearm and fat in the palmar section at the end of the carpal canal as well as proximal arch was noted.  Fingertip was introduced distal no areas compression bipolar cautery was used for hemostasis tourniquet deflated hemostasis obtained 3-0 nylon skin closure Xeroform 4 x 4's Webril and Ace wrap soft dressing was applied.  Office follow-up 1 week outpatient surgery is appropriate treatment of this condition.

## 2022-11-24 NOTE — Anesthesia Procedure Notes (Signed)
Procedure Name: LMA Insertion Date/Time: 11/24/2022 11:59 AM  Performed by: Mariea Clonts, CRNAPre-anesthesia Checklist: Patient identified, Emergency Drugs available, Suction available and Patient being monitored Patient Re-evaluated:Patient Re-evaluated prior to induction Oxygen Delivery Method: Circle System Utilized Preoxygenation: Pre-oxygenation with 100% oxygen Induction Type: IV induction Ventilation: Mask ventilation without difficulty LMA: LMA inserted LMA Size: 4.0 Number of attempts: 1 Airway Equipment and Method: Bite block Placement Confirmation: positive ETCO2 Tube secured with: Tape Dental Injury: Teeth and Oropharynx as per pre-operative assessment

## 2022-11-24 NOTE — Discharge Instructions (Signed)
Keep dressing dry until office follow-up in 1 week.  Keep hand elevated above your heart to decrease swelling and decrease pain.  Limited use of the hand will make it hurt less.  Pain medications been sent into her pharmacy.  You can take  Apply bag over the dressing is sealed with some tape.

## 2022-11-24 NOTE — Anesthesia Postprocedure Evaluation (Signed)
Anesthesia Post Note  Patient: Danielle Kent  Procedure(s) Performed: RIGHT CARPAL TUNNEL RELEASE (Right: Hand)     Patient location during evaluation: PACU Anesthesia Type: General Level of consciousness: sedated Pain management: pain level controlled Vital Signs Assessment: post-procedure vital signs reviewed and stable Respiratory status: spontaneous breathing and respiratory function stable Cardiovascular status: stable Postop Assessment: no apparent nausea or vomiting Anesthetic complications: no   No notable events documented.  Last Vitals:  Vitals:   11/24/22 1300 11/24/22 1315  BP: (!) 162/57 (!) 149/63  Pulse: 85 82  Resp: 19 19  Temp:  36.7 C  SpO2: 100% 96%    Last Pain:  Vitals:   11/24/22 1315  PainSc: 0-No pain                 [name] DANIEL

## 2022-11-24 NOTE — Transfer of Care (Signed)
Immediate Anesthesia Transfer of Care Note  Patient: Danielle Kent  Procedure(s) Performed: RIGHT CARPAL TUNNEL RELEASE (Right: Hand)  Patient Location: PACU  Anesthesia Type:General  Level of Consciousness: awake, alert , and oriented  Airway & Oxygen Therapy: Patient Spontanous Breathing and Patient connected to face mask oxygen  Post-op Assessment: Report given to RN and Post -op Vital signs reviewed and stable  Post vital signs: Reviewed and stable  Last Vitals:  Vitals Value Taken Time  BP 135/61 11/24/22 1237  Temp    Pulse 85 11/24/22 1242  Resp 36 11/24/22 1242  SpO2 94 % 11/24/22 1242  Vitals shown include unvalidated device data.  Last Pain:  Vitals:   11/24/22 0956  PainSc: 0-No pain         Complications: No notable events documented.

## 2022-11-24 NOTE — Anesthesia Procedure Notes (Signed)
Procedure Name: Intubation Date/Time: 11/24/2022 12:04 PM  Performed by: Mariea Clonts, CRNAPre-anesthesia Checklist: Patient identified, Emergency Drugs available, Suction available and Patient being monitored Patient Re-evaluated:Patient Re-evaluated prior to induction Oxygen Delivery Method: Circle System Utilized Preoxygenation: Pre-oxygenation with 100% oxygen Induction Type: IV induction Ventilation: Mask ventilation without difficulty Laryngoscope Size: Glidescope and 3 Grade View: Grade II Tube type: Oral Tube size: 7.0 mm Number of attempts: 1 Airway Equipment and Method: Stylet and Oral airway Placement Confirmation: ETT inserted through vocal cords under direct vision, positive ETCO2 and breath sounds checked- equal and bilateral Tube secured with: Tape Dental Injury: Teeth and Oropharynx as per pre-operative assessment

## 2022-11-24 NOTE — Telephone Encounter (Signed)
Patient needs note for her daughter being absent from school due to her mom's medical procedure being done today. Daughter is Nancylee Gaines. Fax number is [phone].   Note faxed. Patient to bring form for me to complete for her tomorrow.

## 2022-11-24 NOTE — Interval H&P Note (Signed)
History and Physical Interval Note:  11/24/2022 11:39 AM  Danielle Kent  has presented today for surgery, with the diagnosis of right carpal tunnel syndrome.  The various methods of treatment have been discussed with the patient and family. After consideration of risks, benefits and other options for treatment, the patient has consented to  Procedure(s): RIGHT CARPAL TUNNEL RELEASE (Right) as a surgical intervention.  The patient's history has been reviewed, patient examined, no change in status, stable for surgery.  I have reviewed the patient's chart and labs.  Questions were answered to the patient's satisfaction.     Marybelle Killings

## 2022-11-25 ENCOUNTER — Encounter (HOSPITAL_COMMUNITY): Payer: Self-pay | Admitting: Orthopaedic Surgery

## 2022-11-26 ENCOUNTER — Telehealth: Payer: Self-pay | Admitting: Radiology

## 2022-11-26 NOTE — Telephone Encounter (Signed)
I left voicemail requesting Danielle Kent, Case Manager, with [employer] call back with fax number to send in form for [employer].  I have form to complete and send in.

## 2022-11-27 ENCOUNTER — Ambulatory Visit: Payer: Medicaid Other | Attending: Cardiology | Admitting: Student

## 2022-11-27 VITALS — BP 151/80 | HR 74

## 2022-11-27 DIAGNOSIS — I10 Essential (primary) hypertension: Secondary | ICD-10-CM | POA: Diagnosis not present

## 2022-11-27 MED ORDER — SPIRONOLACTONE 25 MG PO TABS: 12.5000 mg | ORAL_TABLET | Freq: Every day | ORAL | 3 refills | Status: DC

## 2022-11-27 NOTE — Patient Instructions (Signed)
Changes made by your pharmacist Cammy Copa, PharmD at today's visit:    Instructions/Changes  (what do you need to do) Your Notes  (what you did and when you did it)  Start taking spironolactone 12.5 mg daily  go for lab in 1-2 weeks of starting this new medication    2.   continue taking other BP    3.    Bring home cuff for validation at the next office visit     Bring all of your meds, your BP cuff and your record of home blood pressures to your next appointment.    HOW TO TAKE YOUR BLOOD PRESSURE AT HOME  Rest 5 minutes before taking your blood pressure.  Don't smoke or drink caffeinated beverages for at least 30 minutes before. Take your blood pressure before (not after) you eat. Sit comfortably with your back supported and both feet on the floor (don't cross your legs). Elevate your arm to heart level on a table or a desk. Use the proper sized cuff. It should fit smoothly and snugly around your bare upper arm. There should be enough room to slip a fingertip under the cuff. The bottom edge of the cuff should be 1 inch above the crease of the elbow. Ideally, take 3 measurements at one sitting and record the average.  Important lifestyle changes to control high blood pressure  Intervention  Effect on the BP  Lose extra pounds and watch your waistline Weight loss is one of the most effective lifestyle changes for controlling blood pressure. If you're overweight or obese, losing even a small amount of weight can help reduce blood pressure. Blood pressure might go down by about 1 millimeter of mercury (mm Hg) with each kilogram (about 2.2 pounds) of weight lost.  Exercise regularly As a general goal, aim for at least 30 minutes of moderate physical activity every day. Regular physical activity can lower high blood pressure by about 5 to 8 mm Hg.  Eat a healthy diet Eating a diet rich in whole grains, fruits, vegetables, and low-fat dairy products and low in saturated fat and  cholesterol. A healthy diet can lower high blood pressure by up to 11 mm Hg.  Reduce salt (sodium) in your diet Even a small reduction of sodium in the diet can improve heart health and reduce high blood pressure by about 5 to 6 mm Hg.  Limit alcohol One drink equals 12 ounces of beer, 5 ounces of wine, or 1.5 ounces of 80-proof liquor.  Limiting alcohol to less than one drink a day for women or two drinks a day for men can help lower blood pressure by about 4 mm Hg.   If you have any questions or concerns please use My Chart to send questions or call the office at [phone]

## 2022-11-27 NOTE — Progress Notes (Signed)
Patient ID: Danielle Kent                 DOB: 1986/03/13                      MRN: [id number]      HPI: Kizzy Olafson is a 37 y.o. female referred by Dr. Percival Spanish  to HTN clinic. PMH is significant for aortic valve regurgitation, hypertension, morbid obesity, Pre-diabetes    Patient presented today for her BP follow up. Reports her home BP ~ 127-130/89. Forgot to bring log. And her home cuff for validation. She is recovering form recent  right carpal tunnel surgery. She is in lot of pain even today. The pain medication is not that strong. She has not made any significant changes to her diet but cut down on sweets.  She has not been going for walks from past 1 week but as soon as she recover from the surgery she will restart her routine.  Social History:  Smoking: none Alcohol: none  Diet:  eats home cooked food, use no to low salt in her recipes and never add salt to the cooked food, does not eat out or does not eat fried food. has cut down on sweets, and drinks more water now.   Exercise: walk 30 min/ day if weather permits   Home BP readings: ~ 127-130/HYPERTENSION CONTROL Vitals:   11/27/22 1114 11/27/22 1119  BP: (!) 148/72 (!) 151/80    The patient's blood pressure is elevated above target today.  In order to address the patient's elevated BP: Blood pressure will be monitored at home to determine if medication changes need to be made.; A new medication was prescribed today.; A referral to the [facility] will be placed.      Wt Readings from Last 3 Encounters:  11/24/22 (!) 346 lb (156.9 kg)  11/18/22 (!) 347 lb (157.4 kg)  11/07/22 (!) 343 lb (155.6 kg)   BP Readings from Last 3 Encounters:  11/27/22 (!) 151/80  11/24/22 (!) 149/63  11/18/22 132/70   Pulse Readings from Last 3 Encounters:  11/27/22 74  11/24/22 82  11/18/22 81    Renal function: Estimated Creatinine Clearance: 84.3 mL/min (A) (by C-G formula based on SCr of 1.3 mg/dL (H)).  Past  Medical History:  Diagnosis Date   Asthma    no inhaler, no problems as an adult   Gestational diabetes 2020   resolved after delivery   HLD (hyperlipidemia)    Hypertension    Sleep apnea    uses CPAP    Current Outpatient Medications on File Prior to Visit  Medication Sig Dispense Refill   atorvastatin (LIPITOR) 40 MG tablet Take 40 mg by mouth at bedtime.     HYDROcodone-acetaminophen (NORCO) 5-325 MG tablet Take 1-2 tablets by mouth every 6 (six) hours as needed for moderate pain. 30 tablet 0   labetalol (NORMODYNE) 200 MG tablet Take 1 tablet (200 mg total) by mouth 3 (three) times daily. 270 tablet 3   Olmesartan-amLODIPine-HCTZ 40-10-25 MG TABS Take 1 tablet by mouth daily. 30 tablet 6   No current facility-administered medications on file prior to visit.    No Known Allergies  Blood pressure (!) 151/80, pulse 74, last menstrual period 11/17/2022, SpO2 98 %.  Overview Hypertension  Current HTN meds: Olmesartan 40 mg amlodipine 10 mg and HCTZ 25 mg, labetalol 200 mg three times daily  Previously tried: losartan- insufficient response  BP goal: <130/80  Essential hypertension Assessment: BP is uncontrolled (goal <130/80) in office 1st BP 148/72 and went up the 2nd time to 151/80 with heart rate 75 however home ~ 127-130/89 (cuff is not validated) Takes BP mediations regularly and tolerates them well without any side effects Denies SOB, palpitation, chest pain, headaches,or swelling Reiterated the importance of regular exercise and low salt diet  Last BMP- renal function little decreased from the previous one but the K level was WNL (patient using 2 ARB agent accidentally)  Reasonable to add MRA to current  betablocker and other 1st line agents    Plan:  Start taking spironolactone 12.5 mg daily -repeat BMP in 1-2 weeks Continue taking Olmesartan 40 mg amlodipine 10 mg and HCTZ 25 mg, labetalol 200 mg three times daily  Patient to keep record of BP readings with heart  rate and report to [place] at the next visit  Bring home cuff for validation at the next OV Patient to see PharmD in 3-4 weeks for follow up   HYPERTENSION CONTROL Vitals:   11/27/22 1114 11/27/22 1119  BP: (!) 148/72 (!) 151/80    The patient's blood pressure is elevated above target today.  In order to address the patient's elevated BP: Blood pressure will be monitored at home to determine if medication changes need to be made.; A new medication was prescribed today.; A referral to the [facility] will be placed.     Thank you  Cammy Copa, Pharm.D [facility] of [facility] [address] [place],  [address]  Phone: [phone]; Fax: [phone]

## 2022-11-27 NOTE — Assessment & Plan Note (Addendum)
Assessment: BP is uncontrolled (goal <130/80) in office 1st BP 148/72 and went up the 2nd time to 151/80 with heart rate 75 however home ~ 127-130/89 (cuff is not validated) Takes BP mediations regularly and tolerates them well without any side effects Denies SOB, palpitation, chest pain, headaches,or swelling Reiterated the importance of regular exercise and low salt diet  Last BMP- renal function little decreased from the previous one but the K level was WNL (patient using 2 ARB agent accidentally)  Reasonable to add MRA to current  betablocker and other 1st line agents    Plan:  Start taking spironolactone 12.5 mg daily -repeat BMP in 1-2 weeks Continue taking Olmesartan 40 mg amlodipine 10 mg and HCTZ 25 mg, labetalol 200 mg three times daily  Patient to keep record of BP readings with heart rate and report to [place] at the next visit  Bring home cuff for validation at the next OV Patient to see PharmD in 3-4 weeks for follow up

## 2022-12-02 ENCOUNTER — Telehealth: Payer: Self-pay | Admitting: Orthopaedic Surgery

## 2022-12-02 NOTE — Telephone Encounter (Signed)
No forms with [employer] and I don't have anything.

## 2022-12-02 NOTE — Telephone Encounter (Signed)
Patient aware forms at front desk for her

## 2022-12-02 NOTE — Telephone Encounter (Signed)
Patient states she was checking on a medical form and was supposed to be signed. Please advise

## 2022-12-09 ENCOUNTER — Ambulatory Visit (INDEPENDENT_AMBULATORY_CARE_PROVIDER_SITE_OTHER): Payer: Medicaid Other | Admitting: Orthopaedic Surgery

## 2022-12-09 ENCOUNTER — Encounter: Payer: Self-pay | Admitting: Orthopaedic Surgery

## 2022-12-09 VITALS — Ht 60.0 in | Wt 346.0 lb

## 2022-12-09 DIAGNOSIS — G5601 Carpal tunnel syndrome, right upper limb: Secondary | ICD-10-CM

## 2022-12-09 MED ORDER — HYDROCODONE-ACETAMINOPHEN 5-325 MG PO TABS: 1.0000 | ORAL_TABLET | Freq: Four times a day (QID) | ORAL | 0 refills | Status: DC | PRN

## 2022-12-09 NOTE — Progress Notes (Signed)
Post-Op Visit Note   Patient: Danielle Kent           Date of Birth: [birth year]           MRN: [id number] Visit Date: 12/09/2022 PCP: Joycelyn Man, FNP   Assessment & Plan: Post right carpal tunnel release.  When she was bathing some water got down into the incision.  No cellulitis.  Edges are slightly white.  Sutures removed.  Hydrocodone 15 tablets sent in.  Wound check 1 week.  She still has significant carpal tunnel in her opposite left hand which is rated at severe.  Chief Complaint:  Chief Complaint  Patient presents with   Right Hand - Routine Post Op    11/24/2022 Right CTR   Visit Diagnoses:  1. Carpal tunnel syndrome, right upper limb     Plan: Return in 1 week for wound check.  Follow-Up Instructions: Return in about 1 week (around 12/16/2022).   Orders:  No orders of the defined types were placed in this encounter.  Meds ordered this encounter  Medications   HYDROcodone-acetaminophen (NORCO) 5-325 MG tablet    Sig: Take 1-2 tablets by mouth every 6 (six) hours as needed for moderate pain.    Dispense:  15 tablet    Refill:  0    Imaging: No results found.  PMFS History: Patient Active Problem List   Diagnosis Date Noted   Carpal tunnel syndrome, right upper limb 11/07/2022   Aortic valve regurgitation    Murmur 11/02/2021   Hypertensive urgency 02/13/2021   Hypertensive emergency 02/12/2021   Acute pulmonary edema (HCC)    Paresthesia of right upper extremity 01/17/2020   Essential hypertension 12/30/2019   Prediabetes 12/30/2019   Morbid obesity ([place]) 12/30/2019   BMI 70 and over, adult ([place]) 03/15/2019   Chronic renal insufficiency 03/15/2019   Severe hypertension    Chronic hypertension with superimposed severe preeclampsia 02/23/2019   Preexisting diabetes complicating pregnancy, antepartum 12/27/2018   Chronic hypertension during pregnancy, antepartum 12/06/2018   Maternal morbid obesity, antepartum ([place]) 12/06/2018   Past Medical History:   Diagnosis Date   Asthma    no inhaler, no problems as an adult   Gestational diabetes 2020   resolved after delivery   HLD (hyperlipidemia)    Hypertension    Sleep apnea    uses CPAP    Family History  Problem Relation Age of Onset   Diabetes Mother    Hypertension Mother    Multiple sclerosis Mother    Asthma Father    Heart attack Maternal Grandfather 41    Past Surgical History:  Procedure Laterality Date   CARPAL TUNNEL RELEASE Right 11/24/2022   Procedure: RIGHT CARPAL TUNNEL RELEASE;  Surgeon: Marybelle Killings, MD;  Location: [place];  Service: Orthopedics;  Laterality: Right;   CESAREAN SECTION  06/06/2010   CESAREAN SECTION WITH BILATERAL TUBAL LIGATION N/A 04/03/2019   Procedure: CESAREAN SECTION WITH BILATERAL TUBAL LIGATION;  Surgeon: Florian Buff, MD;  [facility];  Service: Obstetrics;  Laterality: N/A;   TEE WITHOUT CARDIOVERSION N/A 01/28/2022   Procedure: TRANSESOPHAGEAL ECHOCARDIOGRAM (TEE);  Surgeon: Sanda Klein, MD;  [facility] END[facility]COPY;  Service: Cardiovascular;  Laterality: N/A;   WISDOM TOOTH EXTRACTION  2011   with anesthesia   Social History   Occupational History   Not on file  Tobacco Use   Smoking status: Never   Smokeless tobacco: Never  Vaping Use   Vaping Use: Never used  Substance  and Sexual Activity   Alcohol use: Not Currently   Drug use: Never   Sexual activity: Yes    Birth control/protection: Surgical    Comment: Tubal Ligation

## 2022-12-11 ENCOUNTER — Encounter (INDEPENDENT_AMBULATORY_CARE_PROVIDER_SITE_OTHER): Payer: Self-pay | Admitting: Internal Medicine

## 2022-12-11 ENCOUNTER — Ambulatory Visit (INDEPENDENT_AMBULATORY_CARE_PROVIDER_SITE_OTHER): Payer: Medicaid Other | Admitting: Internal Medicine

## 2022-12-11 VITALS — BP 138/84 | HR 78 | Temp 98.7°F | Ht 60.0 in | Wt 334.0 lb

## 2022-12-11 DIAGNOSIS — E669 Obesity, unspecified: Secondary | ICD-10-CM | POA: Diagnosis not present

## 2022-12-11 DIAGNOSIS — G4733 Obstructive sleep apnea (adult) (pediatric): Secondary | ICD-10-CM | POA: Insufficient documentation

## 2022-12-11 DIAGNOSIS — I10 Essential (primary) hypertension: Secondary | ICD-10-CM | POA: Diagnosis not present

## 2022-12-11 DIAGNOSIS — Z6841 Body Mass Index (BMI) 40.0 and over, adult: Secondary | ICD-10-CM

## 2022-12-11 DIAGNOSIS — R7303 Prediabetes: Secondary | ICD-10-CM | POA: Diagnosis not present

## 2022-12-11 NOTE — Assessment & Plan Note (Signed)
Blood pressure above goal but improved.  Currently on labetalol, olmesartan, amlodipine, hydrochlorothiazide and spironolactone.  The combination of the latter 2 may be the cause of her decreased GFR.  Labetalol may also cause weight gain.  Losing 10 to 15% of her body weight will improve blood pressure control.  We will check renal parameters at intake labs

## 2022-12-11 NOTE — Progress Notes (Signed)
Office: [phone]  /  Fax: [phone]   Initial Visit  Danielle Kent was seen in clinic today to evaluate for obesity. She is interested in losing weight to improve overall health and reduce the risk of weight related complications. She presents today to review program treatment options, initial physical assessment, and evaluation.     She was referred by: PCP  When asked what else they would like to accomplish? She states: Improve existing medical conditions and Reduce number of medications  When asked how has your weight affected you? She states: Contributed to medical problems, Contributed to orthopedic problems or mobility issues, Having fatigue, and Having poor endurance  Some associated conditions: Hypertension, OSA, Prediabetes, and Kidney disease  Contributing factors: Family history, Disruption of circadian rhythm, Nutritional, Medications, Stress, and Pregnancy  Weight promoting medications identified: Beta-blockers  Current nutrition plan: None and Portion control / smart choices  Current level of physical activity: Walking  Current or previous pharmacotherapy: None  Response to medication: Never tried medications   Past medical history includes:   Past Medical History:  Diagnosis Date   Asthma    no inhaler, no problems as an adult   Gestational diabetes 2020   resolved after delivery   HLD (hyperlipidemia)    Hypertension    Sleep apnea    uses CPAP     Objective:   BP 138/84   Pulse 78   Temp 98.7 F (37.1 C)   Ht 5' (1.524 m)   Wt (!) 334 lb (151.5 kg)   LMP 11/17/2022 (Exact Date)   SpO2 98%   BMI 65.23 kg/m  She was weighed on the bioimpedance scale: Body mass index is 65.23 kg/m.  Peak Weight: 347 , Body Fat%:57, Visceral Fat Rating:26, Weight trend over the last 12 months: Decreasing  General:  Alert, oriented and cooperative. Patient is in no acute distress.  Respiratory: Normal respiratory effort, no problems with respiration noted   Extremities: Normal range of motion.    Mental Status: Normal mood and affect. Normal behavior. Normal judgment and thought content.   DIAGNOSTIC DATA REVIEWED:  BMET    Component Value Date/Time   NA 139 11/06/2022 1611   K 4.1 11/06/2022 1611   CL 98 11/06/2022 1611   CO2 27 11/06/2022 1611   GLUCOSE 94 11/06/2022 1611   GLUCOSE 109 (H) 02/14/2021 0234   BUN 29 (H) 11/06/2022 1611   CREATININE 1.30 (H) 11/06/2022 1611   CALCIUM 9.8 11/06/2022 1611   GFRNONAA 50 (L) 02/14/2021 0234   GFRAA 57 (L) 01/30/2020 1609   Lab Results  Component Value Date   HGBA1C 6.0 12/29/2019   HGBA1C 5.9 (H) 12/06/2018   No results found for: "INSULIN" CBC    Component Value Date/Time   WBC 5.8 11/24/2022 1000   RBC 4.48 11/24/2022 1000   HGB 12.5 11/24/2022 1000   HGB 12.9 01/24/2022 0849   HCT 38.5 11/24/2022 1000   HCT 39.8 01/24/2022 0849   PLT 319 11/24/2022 1000   PLT 346 01/24/2022 0849   MCV 85.9 11/24/2022 1000   MCV 83 01/24/2022 0849   MCH 27.9 11/24/2022 1000   MCHC 32.5 11/24/2022 1000   RDW 14.3 11/24/2022 1000   RDW 13.2 01/24/2022 0849   Iron/TIBC/Ferritin/ %Sat No results found for: "IRON", "TIBC", "FERRITIN", "IRONPCTSAT" Lipid Panel     Component Value Date/Time   CHOL 192 12/29/2019 1141   TRIG 194 (H) 12/29/2019 1141   HDL 46 12/29/2019 1141   CHOLHDL 4.2  12/29/2019 1141   LDLCALC 112 (H) 12/29/2019 1141   Hepatic Function Panel     Component Value Date/Time   PROT 5.9 (L) 04/04/2019 0857   PROT 6.5 02/02/2019 1646   ALBUMIN 2.3 (L) 04/04/2019 0857   ALBUMIN 3.5 (L) 02/02/2019 1646   AST 35 04/04/2019 0857   ALT 39 04/04/2019 0857   ALKPHOS 69 04/04/2019 0857   BILITOT 0.5 04/04/2019 0857   BILITOT <0.2 02/02/2019 1646   BILIDIR 0.1 11/26/2018 0534   IBILI 0.2 (L) 11/26/2018 0534      Component Value Date/Time   TSH 1.655 02/12/2021 1554   TSH 2.580 12/06/2018 1109     Assessment and Plan:   There are no diagnoses linked to this  encounter.      Obesity Treatment / Action Plan:  Patient will work on garnering support from family and friends to begin weight loss journey. Will work on eliminating or reducing the presence of highly palatable, calorie dense foods in the home. Will complete provided nutritional and psychosocial assessment questionnaire before the next appointment. Will be scheduled for indirect calorimetry to determine resting energy expenditure in a fasting state.  This will allow [place] to create a reduced calorie, high-protein meal plan to promote loss of fat mass while preserving muscle mass. Counseled on the health benefits of losing 5%-15% of total body weight. Was counseled on nutritional approaches to weight loss and benefits of complex carbs and high quality protein as part of nutritional weight management. Was counseled on pharmacotherapy and role as an adjunct in weight management.   Obesity Education Performed Today:  She was weighed on the bioimpedance scale and results were discussed and documented in the synopsis.  We discussed obesity as a disease and the importance of a more detailed evaluation of all the factors contributing to the disease.  We discussed the importance of long term lifestyle changes which include nutrition, exercise and behavioral modifications as well as the importance of customizing this to her specific health and social needs.  We discussed the benefits of reaching a healthier weight to alleviate the symptoms of existing conditions and reduce the risks of the biomechanical, metabolic and psychological effects of obesity.  Danielle Kent appears to be in the action stage of change and states they are ready to start intensive lifestyle modifications and behavioral modifications.  30 minutes was spent today on this visit including the above counseling, pre-visit chart review, and post-visit documentation.  Reviewed by clinician on day of visit: allergies, medications, problem  list, medical history, surgical history, family history, social history, and previous encounter notes.    I have reviewed the above documentation for accuracy and completeness, and I agree with the above.  Thomes Dinning, MD

## 2022-12-11 NOTE — Assessment & Plan Note (Signed)
Most recent A1c is  Lab Results  Component Value Date   HGBA1C 6.0 12/29/2019   Patient informed of disease state and risk of progression. This may contribute to abnormal cravings, fatigue and diabetes complications without having diabetes.   We reviewed treatment options which include weight loss of about 7 to 10% of body weight, increasing physical activity to 150 minutes a week of moderate intensity.  She may be also be a candidate for pharmacotherapy with metformin or incretin therapy.

## 2022-12-11 NOTE — Assessment & Plan Note (Signed)
On CPAP with reported good compliance. Continue PAP therapy. Weight loss of 15% or more may improve AHI.

## 2022-12-19 ENCOUNTER — Encounter: Payer: Self-pay | Admitting: Orthopaedic Surgery

## 2022-12-19 ENCOUNTER — Ambulatory Visit (INDEPENDENT_AMBULATORY_CARE_PROVIDER_SITE_OTHER): Payer: Medicaid Other | Admitting: Orthopaedic Surgery

## 2022-12-19 VITALS — BP 156/85 | HR 86 | Ht 60.0 in | Wt 334.0 lb

## 2022-12-19 DIAGNOSIS — G5601 Carpal tunnel syndrome, right upper limb: Secondary | ICD-10-CM

## 2022-12-19 DIAGNOSIS — G5602 Carpal tunnel syndrome, left upper limb: Secondary | ICD-10-CM | POA: Insufficient documentation

## 2022-12-19 NOTE — Progress Notes (Signed)
Post-Op Visit Note   Patient: Danielle Kent           Date of Birth: 1985/12/23           MRN: [id number] Visit Date: 12/19/2022 PCP: Joycelyn Man, FNP   Assessment & Plan: Postop right carpal tunnel release.  Steri-Strips pulled off.  She is having identical symptoms in her opposite left hand bothering her and she has to take it at night she is wearing a splint has not been effective.  She states she like to call when she is ready proceed with left carpal tunnel release.  She has been working from home.  Patient states she is resume some work activity at home due to the program she is on she is using the computer.  She will call when she wants to proceed with the left carpal tunnel release.  Chief Complaint:  Chief Complaint  Patient presents with   Right Hand - Follow-up, Routine Post Op    11/24/2022 Right CTR   Left Hand - Pain, Numbness   Visit Diagnoses:  1. Carpal tunnel syndrome, right upper limb   2. Carpal tunnel syndrome, left     Plan: She will call about scheduling left carpal tunnel release when she is ready.  Follow-Up Instructions: No follow-ups on file.   Orders:  No orders of the defined types were placed in this encounter.  No orders of the defined types were placed in this encounter.   Imaging: No results found.  PMFS History: Patient Active Problem List   Diagnosis Date Noted   Carpal tunnel syndrome, left 12/19/2022   OSA (obstructive sleep apnea) 12/11/2022   Carpal tunnel syndrome, right upper limb 11/07/2022   Aortic valve regurgitation    Murmur 11/02/2021   Hypertensive urgency 02/13/2021   Hypertensive emergency 02/12/2021   Acute pulmonary edema (HCC)    Paresthesia of right upper extremity 01/17/2020   Primary hypertension 12/30/2019   Prediabetes 12/30/2019   Class 3 severe obesity with serious comorbidity and body mass index (BMI) of 60.0 to 69.9 in adult ([place]) 12/30/2019   BMI 70 and over, adult ([place]) 03/15/2019   Chronic renal  insufficiency 03/15/2019   Severe hypertension    Chronic hypertension with superimposed severe preeclampsia 02/23/2019   Preexisting diabetes complicating pregnancy, antepartum 12/27/2018   Chronic hypertension during pregnancy, antepartum 12/06/2018   Maternal morbid obesity, antepartum ([place]) 12/06/2018   Past Medical History:  Diagnosis Date   Asthma    no inhaler, no problems as an adult   Gestational diabetes 2020   resolved after delivery   HLD (hyperlipidemia)    Hypertension    Sleep apnea    uses CPAP    Family History  Problem Relation Age of Onset   Diabetes Mother    Hypertension Mother    Multiple sclerosis Mother    Asthma Father    Heart attack Maternal Grandfather 65    Past Surgical History:  Procedure Laterality Date   CARPAL TUNNEL RELEASE Right 11/24/2022   Procedure: RIGHT CARPAL TUNNEL RELEASE;  Surgeon: Marybelle Killings, MD;  Location: [place];  Service: Orthopedics;  Laterality: Right;   CESAREAN SECTION  06/06/2010   CESAREAN SECTION WITH BILATERAL TUBAL LIGATION N/A 04/03/2019   Procedure: CESAREAN SECTION WITH BILATERAL TUBAL LIGATION;  Surgeon: Florian Buff, MD;  Location: [facility];  Service: Obstetrics;  Laterality: N/A;   TEE WITHOUT CARDIOVERSION N/A 01/28/2022   Procedure: TRANSESOPHAGEAL ECHOCARDIOGRAM (TEE);  Surgeon: Sanda Klein,  MD;  Location: [facility] ENDOSCOPY;  Service: Cardiovascular;  Laterality: N/A;   WISDOM TOOTH EXTRACTION  2011   with anesthesia   Social History   Occupational History   Not on file  Tobacco Use   Smoking status: Never   Smokeless tobacco: Never  Vaping Use   Vaping Use: Never used  Substance and Sexual Activity   Alcohol use: Not Currently   Drug use: Never   Sexual activity: Yes    Birth control/protection: Surgical    Comment: Tubal Ligation

## 2022-12-23 ENCOUNTER — Ambulatory Visit: Payer: Medicaid Other

## 2022-12-23 NOTE — Progress Notes (Deleted)
Patient ID: Danielle Kent                 DOB: 20-Mar-1986                      MRN: [id number]      HPI: Danielle Kent is a 37 y.o. female referred by Dr. Percival Spanish  to HTN clinic. PMH is significant for aortic valve regurgitation, hypertension, morbid obesity, Pre-diabetes    Patient presented today for her BP follow up. Reports her home BP ~ 127-130/89. Forgot to bring log. And her home cuff for validation. She is recovering form recent  right carpal tunnel surgery. She is in lot of pain even today. The pain medication is not that strong. She has not made any significant changes to her diet but cut down on sweets.  She has not been going for walks from past 1 week but as soon as she recover from the surgery she will restart her routine.  Social History:  Smoking: none Alcohol: none  Diet:  eats home cooked food, use no to low salt in her recipes and never add salt to the cooked food, does not eat out or does not eat fried food. has cut down on sweets, and drinks more water now.   Exercise: walk 30 min/ day if weather permits   Home BP readings: ~ 127-130/No BP recorded.  {Refresh Note OR Click here to enter BP  :1}***   Wt Readings from Last 3 Encounters:  12/19/22 (!) 334 lb (151.5 kg)  12/11/22 (!) 334 lb (151.5 kg)  12/09/22 (!) 346 lb (156.9 kg)   BP Readings from Last 3 Encounters:  12/19/22 (!) 156/85  12/11/22 138/84  11/27/22 (!) 151/80   Pulse Readings from Last 3 Encounters:  12/19/22 86  12/11/22 78  11/27/22 74    Renal function: CrCl cannot be calculated (Patient's most recent lab result is older than the maximum 21 days allowed.).  Past Medical History:  Diagnosis Date   Asthma    no inhaler, no problems as an adult   Gestational diabetes 2020   resolved after delivery   HLD (hyperlipidemia)    Hypertension    Sleep apnea    uses CPAP    Current Outpatient Medications on File Prior to Visit  Medication Sig Dispense Refill   atorvastatin (LIPITOR) 40 MG  tablet Take 40 mg by mouth at bedtime.     HYDROcodone-acetaminophen (NORCO) 5-325 MG tablet Take 1-2 tablets by mouth every 6 (six) hours as needed for moderate pain. 15 tablet 0   labetalol (NORMODYNE) 200 MG tablet Take 1 tablet (200 mg total) by mouth 3 (three) times daily. 270 tablet 3   Olmesartan-amLODIPine-HCTZ 40-10-25 MG TABS Take 1 tablet by mouth daily. 30 tablet 6   spironolactone (ALDACTONE) 25 MG tablet Take 0.5 tablets (12.5 mg total) by mouth daily. 90 tablet 3   No current facility-administered medications on file prior to visit.    No Known Allergies  Last menstrual period 11/17/2022.  Overview Hypertension  Current HTN meds: Olmesartan 40 mg amlodipine 10 mg and HCTZ 25 mg, labetalol 200 mg three times daily  Previously tried: losartan- insufficient response  BP goal: <130/80   No problem-specific Assessment & Plan notes found for this encounter.  No BP recorded.  {Refresh Note OR Click here to enter BP  :1}***  Thank you  Cammy Copa, Pharm.D [facility] [phone]  [address], [place], [place] [address]  Phone: [phone]; Fax: [phone]

## 2023-01-13 ENCOUNTER — Ambulatory Visit: Payer: Medicaid Other

## 2023-01-13 NOTE — Progress Notes (Deleted)
   Office Visit    Patient Name: Danielle Kent Date of Encounter: 01/13/2023  Primary Care Provider:  Joycelyn Man, FNP Primary Cardiologist:  Minus Breeding, MD  Chief Complaint    Hypertension  Significant Past Medical History   Aortic valve Severe leak noted 11/2021  Pre-diabetes Most recent A1c 6.2  obesity BMI 67, wt 155.6 kg    No Known Allergies  History of Present Illness    Danielle Kent is a 37 y.o. female patient of Dr Percival Spanish, with above medical history in the office to discuss challenges with controlling blood pressure.  She had problems with hypertension through her last pregnancy, developed pre-eclampsia and delivered her daughter at 30 weeks.  She has continued to have problems with BP control since then.  Most recently she was seen by Dr. Cammy Copa last month, with a pressure of 151/80.  Spironolactone 12.5 mg daily was added to her regimen at that time.    Today she returns for follow up.   Blood Pressure Goal:  130/80  Current Medications: olmesartan 40 mg qd, hctz 25 mg qd, amlodipine 10 mg qd,  labetalol 200 mg tid, spironolactone 12.5 mg qd  Family Hx: neither parent with heart disease, father has asthma, mother has MS; brother and sister both healthy, 2 kids 69, 3 -both healthy girls    Social Hx:      Tobacco: no  Alcohol: no  Caffeine: coffee most days (instant)  Diet:  eats home cooked food, use no to low salt in her recipes and never add salt to the cooked food, does not eat out or does not eat fried food. has cut down on sweets, and drinks more water now.    Exercise: walks daily, sometimes limited by SOB; up to 45 minutes  Home BP readings: none recently at home      Accessory Clinical Findings    8/23 labs in [facility]  Na 140, K 4.0, Glu 94, BUN 19, SCr 1.25, GFR 57  Lab Results  Component Value Date   CREATININE 1.30 (H) 11/06/2022   BUN 29 (H) 11/06/2022   NA 139 11/06/2022   K 4.1 11/06/2022   CL 98 11/06/2022   CO2 27  11/06/2022   Lab Results  Component Value Date   ALT 39 04/04/2019   AST 35 04/04/2019   ALKPHOS 69 04/04/2019   BILITOT 0.5 04/04/2019   Lab Results  Component Value Date   HGBA1C 6.0 12/29/2019    Home Medications    Current Outpatient Medications  Medication Sig Dispense Refill   atorvastatin (LIPITOR) 40 MG tablet Take 40 mg by mouth at bedtime.     HYDROcodone-acetaminophen (NORCO) 5-325 MG tablet Take 1-2 tablets by mouth every 6 (six) hours as needed for moderate pain. 15 tablet 0   labetalol (NORMODYNE) 200 MG tablet Take 1 tablet (200 mg total) by mouth 3 (three) times daily. 270 tablet 3   Olmesartan-amLODIPine-HCTZ 40-10-25 MG TABS Take 1 tablet by mouth daily. 30 tablet 6   spironolactone (ALDACTONE) 25 MG tablet Take 0.5 tablets (12.5 mg total) by mouth daily. 90 tablet 3   No current facility-administered medications for this visit.     Assessment & Plan     No problem-specific Assessment & Plan notes found for this encounter.    Tommy Medal PharmD CPP [facility]  [address] [place], [place] [address] [phone]

## 2023-02-18 ENCOUNTER — Ambulatory Visit (HOSPITAL_COMMUNITY): Payer: Medicaid Other | Attending: Cardiology

## 2023-02-18 DIAGNOSIS — I351 Nonrheumatic aortic (valve) insufficiency: Secondary | ICD-10-CM | POA: Diagnosis present

## 2023-02-20 ENCOUNTER — Other Ambulatory Visit: Payer: Self-pay

## 2023-02-20 DIAGNOSIS — I351 Nonrheumatic aortic (valve) insufficiency: Secondary | ICD-10-CM

## 2023-02-20 LAB — ECHOCARDIOGRAM COMPLETE
Area-P 1/2: 4.1 cm2
P 1/2 time: 347 msec
S' Lateral: 3.4 cm

## 2023-02-22 NOTE — Progress Notes (Unsigned)
Cardiology Office Note   Date:  02/24/2023   ID:  Danielle Kent, DOB August 14, 1986, MRN [id number]  PCP:  Dot Been, FNP  Cardiologist:   Rollene Rotunda, MD Referring:  Dot Been, FNP  Chief Complaint  Patient presents with   Aortic insufficiency      History of Present Illness: Danielle Kent is a 37 y.o. female who presents for An echo in 2020 demonstrated a well-preserved ejection fraction and did not suggest any valvular abnormalities. She had TEE demonstrating severe AI but normal LV size and function. Echo had an EF of 60 -65%.  She had a repeat TTE in October 2023 with severe AI but NL LV function and size.   She is being scheduled for an MRI.    She has not had any new cardiovascular symptoms.  She tries to walk for exercise.  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  The patient denies any new symptoms such as chest discomfort, neck or arm discomfort. There has been no new shortness of breath, PND or orthopnea. There have been no reported palpitations, presyncope or syncope.    Past Medical History:  Diagnosis Date   Asthma    no inhaler, no problems as an adult   Gestational diabetes 2020   resolved after delivery   HLD (hyperlipidemia)    Hypertension    Sleep apnea    uses CPAP    Past Surgical History:  Procedure Laterality Date   CARPAL TUNNEL RELEASE Right 11/24/2022   Procedure: RIGHT CARPAL TUNNEL RELEASE;  Surgeon: Eldred Manges, MD;  Location: [facility] OR;  Service: Orthopedics;  Laterality: Right;   CESAREAN SECTION  06/06/2010   CESAREAN SECTION WITH BILATERAL TUBAL LIGATION N/A 04/03/2019   Procedure: CESAREAN SECTION WITH BILATERAL TUBAL LIGATION;  Surgeon: Lazaro Arms, MD;  Location: [facility] LD ORS;  Service: Obstetrics;  Laterality: N/A;   TEE WITHOUT CARDIOVERSION N/A 01/28/2022   Procedure: TRANSESOPHAGEAL ECHOCARDIOGRAM (TEE);  Surgeon: Thurmon Fair, MD;  Location: [facility] ENDOSCOPY;  Service: Cardiovascular;  Laterality: N/A;   WISDOM TOOTH EXTRACTION   2011   with anesthesia     Current Outpatient Medications  Medication Sig Dispense Refill   atorvastatin (LIPITOR) 40 MG tablet Take 40 mg by mouth at bedtime.     labetalol (NORMODYNE) 200 MG tablet Take 1 tablet (200 mg total) by mouth 3 (three) times daily. 270 tablet 3   Olmesartan-amLODIPine-HCTZ 40-10-25 MG TABS Take 1 tablet by mouth daily. 30 tablet 6   spironolactone (ALDACTONE) 25 MG tablet Take 1 tablet (25 mg total) by mouth daily. 90 tablet 3   No current facility-administered medications for this visit.    Allergies:   Patient has no known allergies.    Social History:  The patient  reports that she has never smoked. She has never used smokeless tobacco. She reports that she does not currently use alcohol. She reports that she does not use drugs.   Family History:  The patient's family history includes Asthma in her father; Diabetes in her mother; Heart attack (age of onset: 70) in her maternal grandfather; Hypertension in her mother; Multiple sclerosis in her mother.    ROS:  Please see the history of present illness.   Otherwise, review of systems are positive for none.   All other systems are reviewed and negative.    PHYSICAL EXAM: VS:  BP (!) 140/70   Pulse 93   Ht 5' (1.524 m)   Danielle Kent)  335 lb 6.4 oz (152.1 kg)   SpO2 98%   BMI 65.50 kg/m  , BMI Body mass index is 65.5 kg/m. GENERAL:  Well appearing HEENT:  Pupils equal round and reactive, fundi not visualized, oral mucosa unremarkable NECK:  No jugular venous distention, waveform within normal limits, carotid upstroke brisk and symmetric, no bruits, no thyromegaly LYMPHATICS:  No cervical, inguinal adenopathy LUNGS:  Clear to auscultation bilaterally BACK:  No CVA tenderness CHEST:  Unremarkable HEART:  PMI not displaced or sustained,S1 and S2 within normal limits, no S3, no S4, no clicks, no rubs, 3 out of 6 diastolic murmur heard best at the third left intercostal space ABD:  Flat, positive bowel  sounds normal in frequency in pitch, no bruits, no rebound, no guarding, no midline pulsatile mass, no hepatomegaly, no splenomegaly EXT:  2 plus pulses throughout, no edema, no cyanosis no clubbing SKIN:  No rashes no nodules NEURO:  Cranial nerves II through XII grossly intact, motor grossly intact throughout PSYCH:  Cognitively intact, oriented to person place and time    EKG:  EKG is not ordered today. NA   Recent Labs: 11/06/2022: BUN 29; Creatinine, Ser 1.30; Potassium 4.1; Sodium 139 11/24/2022: Hemoglobin 12.5; Platelets 319    Lipid Panel    Component Value Date/Time   CHOL 192 12/29/2019 1141   TRIG 194 (H) 12/29/2019 1141   HDL 46 12/29/2019 1141   CHOLHDL 4.2 12/29/2019 1141   LDLCALC 112 (H) 12/29/2019 1141      Wt Readings from Last 3 Encounters:  02/24/23 (!) 335 lb 6.4 oz (152.1 kg)  12/19/22 (!) 334 lb (151.5 kg)  12/11/22 (!) 334 lb (151.5 kg)      Other studies Reviewed: Additional studies/ records that were reviewed today include: Echo. Review of the above records demonstrates:  Please see elsewhere in the note.     ASSESSMENT AND PLAN:  AI: She is going to get an MRI.  We talked about surgical repair of this.  At this point no change in therapy.  SLEEP APNEA: I am going to get her follow-up with Dr. Tresa Endo as she is having trouble wearing her BiPAP.  HTN: Her blood pressure is still mildly elevated.  I am going to increase her spironolactone to 25 mg daily.  MORBID OBESITY: I am delighted that she is lost quite a few pounds from her peak.  I encouraged slow and steady weight loss.   Current medicines are reviewed at length with the patient today.  The patient does not have concerns regarding medicines.  The following changes have been made:  no change  Labs/ tests ordered today include: None No orders of the defined types were placed in this encounter.    Disposition:   FU with me in six month.     Signed, Rollene Rotunda, MD  02/24/2023  5:03 PM    [facility]

## 2023-02-23 ENCOUNTER — Encounter: Payer: Self-pay | Admitting: *Deleted

## 2023-02-23 NOTE — Telephone Encounter (Signed)
-----   Message from Rollene Rotunda, MD sent at 02/22/2023 12:27 PM EDT ----- She probably needs to have the appt postponed until after her MRI.

## 2023-02-23 NOTE — Telephone Encounter (Signed)
Unable to reach pt or leave a message  

## 2023-02-24 ENCOUNTER — Encounter: Payer: Self-pay | Admitting: Cardiology

## 2023-02-24 ENCOUNTER — Encounter: Payer: Self-pay | Admitting: *Deleted

## 2023-02-24 ENCOUNTER — Ambulatory Visit: Payer: Medicaid Other | Attending: Cardiology | Admitting: Cardiology

## 2023-02-24 ENCOUNTER — Ambulatory Visit: Payer: Medicaid Other | Admitting: Student

## 2023-02-24 VITALS — BP 140/70 | HR 93 | Ht 60.0 in | Wt 335.4 lb

## 2023-02-24 DIAGNOSIS — I351 Nonrheumatic aortic (valve) insufficiency: Secondary | ICD-10-CM

## 2023-02-24 MED ORDER — SPIRONOLACTONE 25 MG PO TABS: 25.0000 mg | ORAL_TABLET | Freq: Every day | ORAL | 3 refills | Status: AC

## 2023-02-24 NOTE — Progress Notes (Deleted)
Patient ID: Danielle Kent                 DOB: [birth year]                      MRN: [id number]      HPI: Danielle Kent is a 37 y.o. female referred by Dr. Antoine Poche  to HTN clinic. PMH is significant for aortic valve regurgitation, hypertension, morbid obesity, Pre-diabetes    Patient presented today for her BP follow up. Reports her home BP ~ 127-130/89. Forgot to bring log. And her home cuff for validation. She is recovering form recent  right carpal tunnel surgery. She is in lot of pain even today. The pain medication is not that strong. She has not made any significant changes to her diet but cut down on sweets.  She has not been going for walks from past 1 week but as soon as she recover from the surgery she will restart her routine.  Social History:  Smoking: none Alcohol: none  Diet:  eats home cooked food, use no to low salt in her recipes and never add salt to the cooked food, does not eat out or does not eat fried food. has cut down on sweets, and drinks more water now.   Exercise: walk 30 min/ day if weather permits   Home BP readings: ~ 127-130/No BP recorded.  {Refresh Note OR Click here to enter BP  :1}***   Wt Readings from Last 3 Encounters:  12/19/22 (!) 334 lb (151.5 kg)  12/11/22 (!) 334 lb (151.5 kg)  12/09/22 (!) 346 lb (156.9 kg)   BP Readings from Last 3 Encounters:  12/19/22 (!) 156/85  12/11/22 138/84  11/27/22 (!) 151/80   Pulse Readings from Last 3 Encounters:  12/19/22 86  12/11/22 78  11/27/22 74    Renal function: CrCl cannot be calculated (Patient's most recent lab result is older than the maximum 21 days allowed.).  Past Medical History:  Diagnosis Date   Asthma    no inhaler, no problems as an adult   Gestational diabetes 2020   resolved after delivery   HLD (hyperlipidemia)    Hypertension    Sleep apnea    uses CPAP    Current Outpatient Medications on File Prior to Visit  Medication Sig Dispense Refill   atorvastatin (LIPITOR) 40 MG  tablet Take 40 mg by mouth at bedtime.     HYDROcodone-acetaminophen (NORCO) 5-325 MG tablet Take 1-2 tablets by mouth every 6 (six) hours as needed for moderate pain. 15 tablet 0   labetalol (NORMODYNE) 200 MG tablet Take 1 tablet (200 mg total) by mouth 3 (three) times daily. 270 tablet 3   Olmesartan-amLODIPine-HCTZ 40-10-25 MG TABS Take 1 tablet by mouth daily. 30 tablet 6   spironolactone (ALDACTONE) 25 MG tablet Take 0.5 tablets (12.5 mg total) by mouth daily. 90 tablet 3   No current facility-administered medications on file prior to visit.    No Known Allergies  There were no vitals taken for this visit.  Overview Hypertension  Current HTN meds: Olmesartan 40 mg amlodipine 10 mg and HCTZ 25 mg, labetalol 200 mg three times daily  Previously tried: losartan- insufficient response  BP goal: <130/80   No problem-specific Assessment & Plan notes found for this encounter.  No BP recorded.  {Refresh Note OR Click here to enter BP  :1}***  Thank you  Carmela Hurt, Pharm.D [facility]  [facility] [address] [place], [place] [address]  Phone: [phone]; Fax: [phone]

## 2023-02-24 NOTE — Patient Instructions (Signed)
Medication Instructions:   INCREASE SPIRONOLACTONE TO 25 MG ONCE DAILY= 1 WHOLE TABLET ONCE DAILY  *If you need a refill on your cardiac medications before your next appointment, please call your pharmacy*   Follow-Up: At [facility], you and your health needs are our priority.  As part of our continuing mission to provide you with exceptional heart care, we have created designated Provider Care Teams.  These Care Teams include your primary Cardiologist (physician) and Advanced Practice Providers (APPs -  Physician Assistants and Nurse Practitioners) who all work together to provide you with the care you need, when you need it.  We recommend signing up for the patient portal called "MyChart".  Sign up information is provided on this After Visit Summary.  MyChart is used to connect with patients for Virtual Visits (Telemedicine).  Patients are able to view lab/test results, encounter notes, upcoming appointments, etc.  Non-urgent messages can be sent to your provider as well.   To learn more about what you can do with MyChart, go to [email or web address].    Your next appointment:   6 month(s)  Provider:   Rollene Rotunda, MD     Other Instructions  FOLLOW UP SLEEP APPOINTMENT WITH DR Tresa Endo

## 2023-02-27 NOTE — Telephone Encounter (Signed)
This encounter was created in error - please disregard.

## 2023-04-17 ENCOUNTER — Telehealth (HOSPITAL_COMMUNITY): Payer: Self-pay | Admitting: *Deleted

## 2023-04-17 ENCOUNTER — Encounter (HOSPITAL_COMMUNITY): Payer: Self-pay

## 2023-04-17 NOTE — Telephone Encounter (Signed)
Reaching out to patient to offer assistance regarding upcoming cardiac imaging study; pt verbalizes understanding of appt date/time, parking situation and where to check in, and verified current allergies; name and call back number provided for further questions should they arise  Lizmary Nader RN Navigator Cardiac Imaging [facility] [phone] office [phone] cell  Patient reports mild claustrophobia but denies metal. 

## 2023-04-20 ENCOUNTER — Ambulatory Visit (HOSPITAL_COMMUNITY)
Admission: RE | Admit: 2023-04-20 | Discharge: 2023-04-20 | Disposition: A | Discharge status: Home / Self Care | Payer: Medicaid Other | Source: Ambulatory Visit | Attending: Cardiology | Admitting: Cardiology

## 2023-04-20 ENCOUNTER — Encounter (HOSPITAL_COMMUNITY): Payer: Self-pay

## 2023-04-20 DIAGNOSIS — I351 Nonrheumatic aortic (valve) insufficiency: Secondary | ICD-10-CM

## 2023-07-20 ENCOUNTER — Ambulatory Visit: Payer: Medicaid Other | Attending: Cardiovascular Disease | Admitting: Cardiovascular Disease

## 2023-07-28 ENCOUNTER — Other Ambulatory Visit: Payer: Self-pay | Admitting: Cardiology

## 2023-08-26 NOTE — Progress Notes (Deleted)
  Cardiology Office Note:   Date:  08/26/2023  ID:  Salihah Peckham, DOB [birth year], MRN [id number] PCP: Dot Been, FNP  [facility] Providers Cardiologist:  Rollene Rotunda, MD {  History of Present Illness:   Jonise Weightman is a 37 y.o. female who presents for An echo in 2020 demonstrated a well-preserved ejection fraction and did not suggest any valvular abnormalities. She had TEE demonstrating severe AI but normal LV size and function. Echo had an EF of 60 -65%.  She had a repeat TTE in October 2023 with severe AI but NL LV function and size.   She is being scheduled for an MRI.     Since I last saw her ***   *** She has not had any new cardiovascular symptoms.  She tries to walk for exercise.  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  The patient denies any new symptoms such as chest discomfort, neck or arm discomfort. There has been no new shortness of breath, PND or orthopnea. There have been no reported palpitations, presyncope or syncope  ROS: ***  Studies Reviewed:    EKG:       ***  Risk Assessment/Calculations:   {Does this patient have ATRIAL FIBRILLATION?:[phone]} No BP recorded.  {Refresh Note OR Click here to enter BP  :1}***        Physical Exam:   VS:  There were no vitals taken for this visit.   Wt Readings from Last 3 Encounters:  02/24/23 (!) 335 lb 6.4 oz (152.1 kg)  12/19/22 (!) 334 lb (151.5 kg)  12/11/22 (!) 334 lb (151.5 kg)     GEN: Well nourished, well developed in no acute distress NECK: No JVD; No carotid bruits CARDIAC: ***RRR, no murmurs, rubs, gallops RESPIRATORY:  Clear to auscultation without rales, wheezing or rhonchi  ABDOMEN: Soft, non-tender, non-distended EXTREMITIES:  No edema; No deformity   ASSESSMENT AND PLAN:   AI: ***  She is going to get an MRI.  We talked about surgical repair of this.  At this point no change in therapy.   SLEEP APNEA:   ***  I am going to get her follow-up with Dr. Tresa Endo as she is having trouble  wearing her BiPAP.   HTN: Her blood pressure is ***   still mildly elevated.  I am going to increase her spironolactone to 25 mg daily.   MORBID OBESITY:  ***   I am delighted that she is lost quite a few pounds from her peak.  I encouraged slow and steady weight loss.    {Are you ordering a CV Procedure (e.g. stress test, cath, DCCV, TEE, etc)?   Press F2        :[id number]}  Follow up ***  Signed, Rollene Rotunda, MD

## 2023-08-28 ENCOUNTER — Ambulatory Visit: Payer: Medicaid Other | Admitting: Cardiology

## 2023-08-28 DIAGNOSIS — I351 Nonrheumatic aortic (valve) insufficiency: Secondary | ICD-10-CM

## 2023-08-28 DIAGNOSIS — I10 Essential (primary) hypertension: Secondary | ICD-10-CM

## 2023-09-09 ENCOUNTER — Emergency Department (HOSPITAL_BASED_OUTPATIENT_CLINIC_OR_DEPARTMENT_OTHER): Payer: Medicaid Other | Admitting: Radiology

## 2023-09-09 ENCOUNTER — Emergency Department (HOSPITAL_BASED_OUTPATIENT_CLINIC_OR_DEPARTMENT_OTHER)
Admission: EM | Admit: 2023-09-09 | Discharge: 2023-09-09 | Disposition: A | Discharge status: Home / Self Care | Payer: Medicaid Other | Attending: Emergency Medicine | Admitting: Emergency Medicine

## 2023-09-09 ENCOUNTER — Other Ambulatory Visit: Payer: Self-pay

## 2023-09-09 ENCOUNTER — Encounter (HOSPITAL_BASED_OUTPATIENT_CLINIC_OR_DEPARTMENT_OTHER): Payer: Self-pay | Admitting: Emergency Medicine

## 2023-09-09 DIAGNOSIS — M25561 Pain in right knee: Secondary | ICD-10-CM

## 2023-09-09 DIAGNOSIS — M545 Low back pain, unspecified: Secondary | ICD-10-CM | POA: Insufficient documentation

## 2023-09-09 DIAGNOSIS — Y92410 Unspecified street and highway as the place of occurrence of the external cause: Secondary | ICD-10-CM | POA: Diagnosis not present

## 2023-09-09 HISTORY — DX: Atherosclerotic heart disease of native coronary artery without angina pectoris: I25.10

## 2023-09-09 MED ORDER — OXYCODONE-ACETAMINOPHEN 5-325 MG PO TABS
2.0000 | ORAL_TABLET | Freq: Once | ORAL | Status: AC
  Administered 2023-09-09: 1 via ORAL
  Filled 2023-09-09: qty 2

## 2023-09-09 NOTE — ED Provider Notes (Signed)
I assumed care from Dr. Rosalia Hammers waiting on imaging.  I have independently visualized and interpreted pt's images today.  She is without evidence of acute fracture.  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  Discussed all the findings with the patient.  She was given follow-up with sports medicine and has a follow-up with her physician in 2 weeks.    Gwyneth Sprout, MD 09/09/23 [phone]

## 2023-09-09 NOTE — Discharge Instructions (Signed)
Your x-rays today do not show any signs of broken bones but you do have some mild arthritis in all 3 places.
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
## 2023-09-09 NOTE — ED Notes (Signed)
Reviewed AVS/discharge instruction with patient. Time allotted for and all questions answered. Patient is agreeable for d/c and escorted to ed exit by staff.  
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
## 2023-09-30 ENCOUNTER — Ambulatory Visit (INDEPENDENT_AMBULATORY_CARE_PROVIDER_SITE_OTHER): Payer: Self-pay

## 2023-09-30 ENCOUNTER — Ambulatory Visit (INDEPENDENT_AMBULATORY_CARE_PROVIDER_SITE_OTHER): Payer: Medicaid Other | Admitting: Podiatry

## 2023-09-30 ENCOUNTER — Encounter: Payer: Self-pay | Admitting: Podiatry

## 2023-09-30 DIAGNOSIS — M7751 Other enthesopathy of right foot: Secondary | ICD-10-CM

## 2023-09-30 DIAGNOSIS — M21611 Bunion of right foot: Secondary | ICD-10-CM

## 2023-09-30 DIAGNOSIS — M21619 Bunion of unspecified foot: Secondary | ICD-10-CM
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
## 2023-11-11 NOTE — Progress Notes (Deleted)
  Cardiology Office Note:   Date:  11/11/2023  ID:  Danielle Kent, DOB 12-Mar-1986, MRN [id number] PCP: Dot Been, FNP  [facility] Providers Cardiologist:  Rollene Rotunda, MD {  History of Present Illness:   Danielle Kent is a 38 y.o. female who presents for An echo in 2020 demonstrated a well-preserved ejection fraction and did not suggest any valvular abnormalities. She had TEE demonstrating severe AI but normal LV size and function. Echo had an EF of 60 -65%.  She had a repeat TTE in October 2023 with severe AI but NL LV function and size.   She had follow up echo in May 2024 with again LV dilatation or dysfunction but with severe AI eccentric.  MRI was scheduled but she did not have this.  She also was supposed to see Dr. Tresa Endo for treatment of her sleep apnea but she was a no show for the appt.    She returns for follow up.  ***   *** She is being scheduled for an MRI.     Since she was last seen ***  ***  She has not had any new cardiovascular symptoms.  She tries to walk for exercise.  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  The patient denies any new symptoms such as chest discomfort, neck or arm discomfort. There has been no new shortness of breath, PND or orthopnea. There have been no reported palpitations, presyncope or syncope.   ROS: ***  Studies Reviewed:    EKG:       ***  Risk Assessment/Calculations:   {Does this patient have ATRIAL FIBRILLATION?:[phone]} No BP recorded.  {Refresh Note OR Click here to enter BP  :1}***        Physical Exam:   VS:  There were no vitals taken for this visit.   Wt Readings from Last 3 Encounters:  09/09/23 (!) 306 lb (138.8 kg)  02/24/23 (!) 335 lb 6.4 oz (152.1 kg)  12/19/22 (!) 334 lb (151.5 kg)     GEN: Well nourished, well developed in no acute distress NECK: No JVD; No carotid bruits CARDIAC: ***RR, *** murmurs, rubs, gallops RESPIRATORY:  Clear to auscultation without rales, wheezing or rhonchi  ABDOMEN:  Soft, non-tender, non-distended EXTREMITIES:  No edema; No deformity   ASSESSMENT AND PLAN:   AI:   ***  She is going to get an MRI.  We talked about surgical repair of this.  At this point no change in therapy.   SLEEP APNEA:   ***  I am going to get her follow-up with Dr. Tresa Endo as she is having trouble wearing her BiPAP.   HTN: Her blood pressure is ***  still mildly elevated.  I am going to increase her spironolactone to 25 mg daily.   MORBID OBESITY:  ***  I am delighted that she is lost quite a few pounds from her peak.  I encouraged slow and steady weight loss.     Follow up ***  Signed, Rollene Rotunda, MD

## 2023-11-12 ENCOUNTER — Ambulatory Visit: Payer: Medicaid Other | Attending: Cardiology | Admitting: Cardiology

## 2023-11-12 DIAGNOSIS — G4733 Obstructive sleep apnea (adult) (pediatric): Secondary | ICD-10-CM

## 2023-11-12 DIAGNOSIS — I351 Nonrheumatic aortic (valve) insufficiency: Secondary | ICD-10-CM

## 2023-11-12 DIAGNOSIS — I10 Essential (primary) hypertension: Secondary | ICD-10-CM

## 2023-11-21 ENCOUNTER — Other Ambulatory Visit: Payer: Self-pay | Admitting: Cardiology

## 2023-11-24 ENCOUNTER — Other Ambulatory Visit: Payer: Self-pay | Admitting: Cardiology

## 2024-02-12 ENCOUNTER — Other Ambulatory Visit: Payer: Self-pay | Admitting: Cardiology

## 2024-05-27 ENCOUNTER — Other Ambulatory Visit: Payer: Self-pay | Admitting: Cardiology
